# Patient Record
Sex: Female | Born: 1935 | Race: Black or African American | Hispanic: No | Marital: Single | Smoking: Never smoker
Health system: Southern US, Community
[De-identification: ages and names within clinical notes are randomized; demographics above are authoritative.]

## PROBLEM LIST (undated history)

## (undated) DIAGNOSIS — I6529 Occlusion and stenosis of unspecified carotid artery: Secondary | ICD-10-CM

## (undated) DIAGNOSIS — I2510 Atherosclerotic heart disease of native coronary artery without angina pectoris: Secondary | ICD-10-CM

## (undated) DIAGNOSIS — R7302 Impaired glucose tolerance (oral): Secondary | ICD-10-CM

## (undated) DIAGNOSIS — I255 Ischemic cardiomyopathy: Secondary | ICD-10-CM

## (undated) DIAGNOSIS — J40 Bronchitis, not specified as acute or chronic: Secondary | ICD-10-CM

## (undated) DIAGNOSIS — F0390 Unspecified dementia without behavioral disturbance: Secondary | ICD-10-CM

## (undated) DIAGNOSIS — E785 Hyperlipidemia, unspecified: Secondary | ICD-10-CM

## (undated) DIAGNOSIS — I252 Old myocardial infarction: Secondary | ICD-10-CM

## (undated) DIAGNOSIS — I10 Essential (primary) hypertension: Secondary | ICD-10-CM

## (undated) HISTORY — DX: Impaired glucose tolerance (oral): R73.02

## (undated) HISTORY — DX: Old myocardial infarction: I25.2

## (undated) HISTORY — DX: Occlusion and stenosis of unspecified carotid artery: I65.29

## (undated) HISTORY — DX: Bronchitis, not specified as acute or chronic: J40

## (undated) HISTORY — DX: Hyperlipidemia, unspecified: E78.5

## (undated) HISTORY — DX: Ischemic cardiomyopathy: I25.5

## (undated) HISTORY — DX: Atherosclerotic heart disease of native coronary artery without angina pectoris: I25.10

---

## 1998-08-19 ENCOUNTER — Other Ambulatory Visit: Admission: RE | Admit: 1998-08-19 | Discharge: 1998-08-19 | Payer: Self-pay | Admitting: Internal Medicine

## 1998-09-12 ENCOUNTER — Encounter: Payer: Self-pay | Admitting: Internal Medicine

## 1998-09-12 ENCOUNTER — Ambulatory Visit (HOSPITAL_COMMUNITY): Admission: RE | Admit: 1998-09-12 | Discharge: 1998-09-12 | Payer: Self-pay | Admitting: Internal Medicine

## 1998-09-17 ENCOUNTER — Ambulatory Visit (HOSPITAL_COMMUNITY): Admission: RE | Admit: 1998-09-17 | Discharge: 1998-09-17 | Payer: Self-pay | Admitting: Internal Medicine

## 1998-09-19 ENCOUNTER — Ambulatory Visit (HOSPITAL_COMMUNITY): Admission: RE | Admit: 1998-09-19 | Discharge: 1998-09-19 | Payer: Self-pay | Admitting: Internal Medicine

## 1999-10-06 ENCOUNTER — Encounter: Payer: Self-pay | Admitting: Internal Medicine

## 1999-10-06 ENCOUNTER — Ambulatory Visit (HOSPITAL_COMMUNITY): Admission: RE | Admit: 1999-10-06 | Discharge: 1999-10-06 | Payer: Self-pay | Admitting: Internal Medicine

## 2000-09-23 ENCOUNTER — Encounter: Admission: RE | Admit: 2000-09-23 | Discharge: 2000-09-23 | Payer: Self-pay | Admitting: Internal Medicine

## 2000-09-23 ENCOUNTER — Encounter: Payer: Self-pay | Admitting: Internal Medicine

## 2000-10-07 ENCOUNTER — Ambulatory Visit (HOSPITAL_COMMUNITY): Admission: RE | Admit: 2000-10-07 | Discharge: 2000-10-07 | Payer: Self-pay | Admitting: Internal Medicine

## 2000-10-07 ENCOUNTER — Encounter: Payer: Self-pay | Admitting: Internal Medicine

## 2001-08-15 ENCOUNTER — Other Ambulatory Visit: Admission: RE | Admit: 2001-08-15 | Discharge: 2001-08-15 | Payer: Self-pay | Admitting: Internal Medicine

## 2001-10-10 ENCOUNTER — Encounter: Payer: Self-pay | Admitting: Internal Medicine

## 2001-10-10 ENCOUNTER — Ambulatory Visit (HOSPITAL_COMMUNITY): Admission: RE | Admit: 2001-10-10 | Discharge: 2001-10-10 | Payer: Self-pay | Admitting: Internal Medicine

## 2002-10-12 ENCOUNTER — Ambulatory Visit (HOSPITAL_COMMUNITY): Admission: RE | Admit: 2002-10-12 | Discharge: 2002-10-12 | Payer: Self-pay | Admitting: Internal Medicine

## 2002-10-12 ENCOUNTER — Encounter: Payer: Self-pay | Admitting: Internal Medicine

## 2002-12-05 ENCOUNTER — Encounter (INDEPENDENT_AMBULATORY_CARE_PROVIDER_SITE_OTHER): Payer: Self-pay

## 2002-12-05 ENCOUNTER — Ambulatory Visit (HOSPITAL_COMMUNITY): Admission: RE | Admit: 2002-12-05 | Discharge: 2002-12-05 | Payer: Self-pay | Admitting: Gastroenterology

## 2003-10-15 ENCOUNTER — Ambulatory Visit (HOSPITAL_COMMUNITY): Admission: RE | Admit: 2003-10-15 | Discharge: 2003-10-15 | Payer: Self-pay | Admitting: Internal Medicine

## 2004-10-28 ENCOUNTER — Ambulatory Visit (HOSPITAL_COMMUNITY): Admission: RE | Admit: 2004-10-28 | Discharge: 2004-10-28 | Payer: Self-pay | Admitting: Internal Medicine

## 2005-12-13 ENCOUNTER — Ambulatory Visit (HOSPITAL_COMMUNITY): Admission: RE | Admit: 2005-12-13 | Discharge: 2005-12-13 | Payer: Self-pay | Admitting: Internal Medicine

## 2006-12-15 ENCOUNTER — Ambulatory Visit (HOSPITAL_COMMUNITY): Admission: RE | Admit: 2006-12-15 | Discharge: 2006-12-15 | Payer: Self-pay | Admitting: Internal Medicine

## 2007-12-21 ENCOUNTER — Ambulatory Visit (HOSPITAL_COMMUNITY): Admission: RE | Admit: 2007-12-21 | Discharge: 2007-12-21 | Payer: Self-pay | Admitting: Internal Medicine

## 2008-12-25 ENCOUNTER — Ambulatory Visit (HOSPITAL_COMMUNITY): Admission: RE | Admit: 2008-12-25 | Discharge: 2008-12-25 | Payer: Self-pay | Admitting: Internal Medicine

## 2010-02-17 ENCOUNTER — Ambulatory Visit (HOSPITAL_COMMUNITY): Admission: RE | Admit: 2010-02-17 | Discharge: 2010-02-17 | Payer: Self-pay | Admitting: Internal Medicine

## 2010-09-18 NOTE — Op Note (Signed)
[name] [name], [name] [name]                            ACCOUNT NO.:  [id number]   MEDICAL RECORD NO.:  [id number]                   PATIENT TYPE:  AMB   LOCATION:  ENDO                                 FACILITY:  [facility]   PHYSICIAN:  Anselmo Rod, M.D.               DATE OF BIRTH:  May 12, 1932   DATE OF PROCEDURE:  12/05/2002  DATE OF DISCHARGE:                                 OPERATIVE REPORT   PROCEDURE:  Colonoscopy with snare polypectomy x 7.   ENDOSCOPIST:  Charna Elizabeth, M.D.   INSTRUMENT USED:  Olympus video colonoscope.   INDICATIONS FOR PROCEDURE:  77 year old African American female with guaiac  positive stools undergoing screening colonoscopy to rule out colonic polyps,  masses, hemorrhoids, etc.   PREPROCEDURE PREPARATION:  Informed consent was obtained from the patient.  The patient was fasted for eight hours prior to the procedure and prepped  with a bottle of magnesium citrate and a gallon of GoLYTELY the night prior  to the procedure.   PREPROCEDURE PHYSICAL:  Patient with stable vital signs.  Neck supple.  Chest clear to auscultation.  S1 and S2 regular.  Abdomen soft with normal  bowel sounds.   DESCRIPTION OF PROCEDURE:  The patient was placed in the left lateral  decubitus position, sedated with 50 mg of Demerol and 5 mg Versed  intravenously.  Once the patient was adequately sedated, maintained on low  flow oxygen, continuous cardiac monitoring, the Olympus video colonoscope  was advanced from the rectum to the cecum with difficulty because of the  patient's body habitus.  The patient's position was changed from the left  lateral to supine position on several occasions and gentle abdominal  pressure was applied to reach the cecal base.  The appendiceal orifice and  ileocecal valve were clearly visualized and photographed.  Two small sessile  polyps were snared from the rectum, five small sessile polyps were snared  from the rectosigmoid area, small internal  hemorrhoid was seen on  retroflexion of the rectum.  The mucosa beyond the rectosigmoid area to the  terminal ileum appeared normal.  The patient tolerated the procedure well  without complications.   IMPRESSION:  1. Multiple polyps removed from the colon by snare polypectomy.  2. Small nonbleeding internal hemorrhoids.  3. Normal left colon, transverse colon, and terminal ileum.   RECOMMENDATIONS:  1. Await pathology result.  2.     Avoid all nonsteroidals for the next four weeks.  3. Outpatient follow up in the next two weeks.  4. Repeat colorectal cancer screening depending on the pathology results.                                               Anselmo Rod, M.D.    JNM/MEDQ  D:  12/05/2002  T:  12/05/2002  Job:  [id number]   cc:   Merlene Laughter. Renae Gloss, M.D.  [address]  [address]  [place]  [place] [address]  Fax: [phone]

## 2011-02-23 ENCOUNTER — Other Ambulatory Visit (HOSPITAL_COMMUNITY): Payer: Self-pay | Admitting: Internal Medicine

## 2011-02-23 DIAGNOSIS — Z1231 Encounter for screening mammogram for malignant neoplasm of breast: Secondary | ICD-10-CM

## 2011-03-23 ENCOUNTER — Ambulatory Visit (HOSPITAL_COMMUNITY)
Admission: RE | Admit: 2011-03-23 | Discharge: 2011-03-23 | Disposition: A | Discharge status: Home / Self Care | Payer: Medicare Other | Source: Ambulatory Visit | Attending: Internal Medicine | Admitting: Internal Medicine

## 2011-03-23 DIAGNOSIS — Z1231 Encounter for screening mammogram for malignant neoplasm of breast: Secondary | ICD-10-CM | POA: Insufficient documentation

## 2012-04-07 ENCOUNTER — Other Ambulatory Visit (HOSPITAL_COMMUNITY): Payer: Self-pay | Admitting: Internal Medicine

## 2012-04-07 DIAGNOSIS — Z1231 Encounter for screening mammogram for malignant neoplasm of breast: Secondary | ICD-10-CM

## 2012-04-27 ENCOUNTER — Ambulatory Visit (HOSPITAL_COMMUNITY)
Admission: RE | Admit: 2012-04-27 | Discharge: 2012-04-27 | Disposition: A | Discharge status: Home / Self Care | Payer: Medicare Other | Source: Ambulatory Visit | Attending: Internal Medicine | Admitting: Internal Medicine

## 2012-04-27 DIAGNOSIS — Z1231 Encounter for screening mammogram for malignant neoplasm of breast: Secondary | ICD-10-CM | POA: Insufficient documentation

## 2013-04-04 ENCOUNTER — Other Ambulatory Visit (HOSPITAL_COMMUNITY): Payer: Self-pay | Admitting: Internal Medicine

## 2013-04-04 DIAGNOSIS — Z1231 Encounter for screening mammogram for malignant neoplasm of breast: Secondary | ICD-10-CM

## 2013-05-01 ENCOUNTER — Ambulatory Visit (HOSPITAL_COMMUNITY)
Admission: RE | Admit: 2013-05-01 | Discharge: 2013-05-01 | Disposition: A | Discharge status: Home / Self Care | Payer: Medicare PPO | Source: Ambulatory Visit | Attending: Internal Medicine | Admitting: Internal Medicine

## 2013-05-01 DIAGNOSIS — Z1231 Encounter for screening mammogram for malignant neoplasm of breast: Secondary | ICD-10-CM | POA: Insufficient documentation

## 2014-02-10 ENCOUNTER — Encounter (HOSPITAL_COMMUNITY): Payer: Self-pay | Admitting: Emergency Medicine

## 2014-02-10 ENCOUNTER — Inpatient Hospital Stay (HOSPITAL_COMMUNITY)
Admission: EM | Admit: 2014-02-10 | Discharge: 2014-02-21 | DRG: 234 | Disposition: A | Discharge status: Home Health | Payer: Medicare PPO | Attending: Surgery | Admitting: Surgery

## 2014-02-10 ENCOUNTER — Emergency Department (HOSPITAL_COMMUNITY): Payer: Medicare PPO

## 2014-02-10 DIAGNOSIS — E669 Obesity, unspecified: Secondary | ICD-10-CM | POA: Diagnosis present

## 2014-02-10 DIAGNOSIS — K5900 Constipation, unspecified: Secondary | ICD-10-CM | POA: Diagnosis present

## 2014-02-10 DIAGNOSIS — Z6831 Body mass index (BMI) 31.0-31.9, adult: Secondary | ICD-10-CM | POA: Diagnosis not present

## 2014-02-10 DIAGNOSIS — R001 Bradycardia, unspecified: Secondary | ICD-10-CM | POA: Diagnosis present

## 2014-02-10 DIAGNOSIS — I200 Unstable angina: Secondary | ICD-10-CM

## 2014-02-10 DIAGNOSIS — Z8249 Family history of ischemic heart disease and other diseases of the circulatory system: Secondary | ICD-10-CM | POA: Diagnosis not present

## 2014-02-10 DIAGNOSIS — J9811 Atelectasis: Secondary | ICD-10-CM

## 2014-02-10 DIAGNOSIS — I214 Non-ST elevation (NSTEMI) myocardial infarction: Secondary | ICD-10-CM | POA: Diagnosis present

## 2014-02-10 DIAGNOSIS — E785 Hyperlipidemia, unspecified: Secondary | ICD-10-CM

## 2014-02-10 DIAGNOSIS — E039 Hypothyroidism, unspecified: Secondary | ICD-10-CM

## 2014-02-10 DIAGNOSIS — I959 Hypotension, unspecified: Secondary | ICD-10-CM

## 2014-02-10 DIAGNOSIS — I10 Essential (primary) hypertension: Secondary | ICD-10-CM | POA: Diagnosis present

## 2014-02-10 DIAGNOSIS — E876 Hypokalemia: Secondary | ICD-10-CM | POA: Diagnosis not present

## 2014-02-10 DIAGNOSIS — I25110 Atherosclerotic heart disease of native coronary artery with unstable angina pectoris: Secondary | ICD-10-CM | POA: Diagnosis present

## 2014-02-10 DIAGNOSIS — I2584 Coronary atherosclerosis due to calcified coronary lesion: Secondary | ICD-10-CM | POA: Diagnosis present

## 2014-02-10 DIAGNOSIS — I9581 Postprocedural hypotension: Secondary | ICD-10-CM | POA: Diagnosis present

## 2014-02-10 DIAGNOSIS — Z951 Presence of aortocoronary bypass graft: Secondary | ICD-10-CM

## 2014-02-10 DIAGNOSIS — E780 Pure hypercholesterolemia: Secondary | ICD-10-CM | POA: Diagnosis present

## 2014-02-10 DIAGNOSIS — I9589 Other hypotension: Secondary | ICD-10-CM

## 2014-02-10 DIAGNOSIS — I25810 Atherosclerosis of coronary artery bypass graft(s) without angina pectoris: Secondary | ICD-10-CM

## 2014-02-10 HISTORY — DX: Essential (primary) hypertension: I10

## 2014-02-10 LAB — BASIC METABOLIC PANEL
Anion gap: 14 (ref 5–15)
BUN: 16 mg/dL (ref 6–23)
CHLORIDE: 103 meq/L (ref 96–112)
CO2: 24 mEq/L (ref 19–32)
Calcium: 10 mg/dL (ref 8.4–10.5)
Creatinine, Ser: 1.11 mg/dL — ABNORMAL HIGH (ref 0.50–1.10)
GFR calc Af Amer: 53 mL/min — ABNORMAL LOW (ref >90)
GFR, EST NON AFRICAN AMERICAN: 46 mL/min — AB (ref >90)
GLUCOSE: 128 mg/dL — AB (ref 70–99)
POTASSIUM: 4 meq/L (ref 3.7–5.3)
SODIUM: 141 meq/L (ref 137–147)

## 2014-02-10 LAB — I-STAT TROPONIN, ED: Troponin i, poc: 0.05 ng/mL (ref 0.00–0.08)

## 2014-02-10 LAB — CBC
HEMATOCRIT: 37 % (ref 36.0–46.0)
Hemoglobin: 12.6 g/dL (ref 12.0–15.0)
MCH: 28.8 pg (ref 26.0–34.0)
MCHC: 34.1 g/dL (ref 30.0–36.0)
MCV: 84.7 fL (ref 78.0–100.0)
Platelets: 224 10*3/uL (ref 150–400)
RBC: 4.37 MIL/uL (ref 3.87–5.11)
RDW: 15.3 % (ref 11.5–15.5)
WBC: 7.8 10*3/uL (ref 4.0–10.5)

## 2014-02-10 MED ORDER — LEVOTHYROXINE SODIUM 100 MCG PO TABS
100.0000 ug | ORAL_TABLET | Freq: Every day | ORAL | Status: DC
Start: 2014-02-11 — End: 2014-02-16
  Administered 2014-02-11 – 2014-02-14 (×4): 100 ug via ORAL
  Filled 2014-02-10 (×8): qty 1

## 2014-02-10 MED ORDER — ASPIRIN 300 MG RE SUPP
300.0000 mg | RECTAL | Status: AC
  Filled 2014-02-10: qty 1

## 2014-02-10 MED ORDER — ASPIRIN 81 MG PO CHEW
324.0000 mg | CHEWABLE_TABLET | ORAL | Status: AC
  Filled 2014-02-10: qty 4

## 2014-02-10 MED ORDER — ACETAMINOPHEN 325 MG PO TABS: 650.0000 mg | ORAL_TABLET | ORAL | Status: DC | PRN

## 2014-02-10 MED ORDER — ASPIRIN EC 81 MG PO TBEC: 81.0000 mg | DELAYED_RELEASE_TABLET | Freq: Every day | ORAL | Status: DC

## 2014-02-10 MED ORDER — HEPARIN SODIUM (PORCINE) 5000 UNIT/ML IJ SOLN
5000.0000 [IU] | Freq: Three times a day (TID) | INTRAMUSCULAR | Status: DC
  Administered 2014-02-11: 5000 [IU] via SUBCUTANEOUS
  Filled 2014-02-10: qty 1

## 2014-02-10 MED ORDER — AMLODIPINE-ATORVASTATIN 10-10 MG PO TABS: 1.0000 | ORAL_TABLET | Freq: Every day | ORAL | Status: DC

## 2014-02-10 MED ORDER — ONDANSETRON HCL 4 MG/2ML IJ SOLN: 4.0000 mg | Freq: Four times a day (QID) | INTRAMUSCULAR | Status: DC | PRN

## 2014-02-10 MED ORDER — VITAMIN D3 25 MCG (1000 UNIT) PO TABS
2000.0000 [IU] | ORAL_TABLET | Freq: Every day | ORAL | Status: DC
  Administered 2014-02-11 – 2014-02-14 (×4): 2000 [IU] via ORAL
  Filled 2014-02-10 (×5): qty 2

## 2014-02-10 MED ORDER — NITROGLYCERIN 0.4 MG SL SUBL: 0.4000 mg | SUBLINGUAL_TABLET | SUBLINGUAL | Status: DC | PRN

## 2014-02-10 MED ORDER — ASPIRIN EC 81 MG PO TBEC
81.0000 mg | DELAYED_RELEASE_TABLET | Freq: Every day | ORAL | Status: DC
  Administered 2014-02-11 – 2014-02-14 (×4): 81 mg via ORAL
  Filled 2014-02-10 (×5): qty 1

## 2014-02-10 MED ORDER — NITROGLYCERIN 0.4 MG SL SUBL
0.4000 mg | SUBLINGUAL_TABLET | SUBLINGUAL | Status: DC | PRN
  Administered 2014-02-11: 0.4 mg via SUBLINGUAL
  Filled 2014-02-10: qty 1

## 2014-02-10 NOTE — ED Provider Notes (Signed)
CSN: [id number]     Arrival date & time 02/10/14  1909 History   First MD Initiated Contact with Patient 02/10/14 1950     Chief Complaint  Patient presents with  . Chest Pain     (Consider location/radiation/quality/duration/timing/severity/associated sxs/prior Treatment) HPI Complains of anterior chest pain radiating to left arm onset one week ago intermittent she first noticed it when walking on a treadmill has subsequently noticed it when walking in her home or doing housework discomfort is improved with rest. She had mild discomfort when she called 911 today EMS treated patient with one sublingual nitroglycerin. Patient had taken aspirin 325 mg prior to EMS arriving today. She presently asymptomatic and pain-free. No other associated symptoms Past Medical History  Diagnosis Date  . Hypertension   . High cholesterol    Past Surgical History  Procedure Laterality Date  . Cesarean section     No family history on file. History  Substance Use Topics  . Smoking status: Never Smoker   . Smokeless tobacco: Not on file  . Alcohol Use: No   OB History   Grav Para Term Preterm Abortions TAB SAB Ect Mult Living                 Review of Systems  Constitutional: Negative.   HENT: Negative.   Respiratory: Negative.   Cardiovascular: Positive for chest pain.  Gastrointestinal: Negative.   Musculoskeletal: Negative.   Skin: Negative.   Neurological: Negative.   Psychiatric/Behavioral: Negative.   All other systems reviewed and are negative.     Allergies  Review of patient's allergies indicates no known allergies.  Home Medications   Prior to Admission medications   Medication Sig Start Date End Date Taking? Authorizing Provider  amlodipine-atorvastatin (CADUET) 10-10 MG per tablet Take 1 tablet by mouth daily. 02/07/14  Yes Historical Provider, MD  aspirin EC 81 MG tablet Take 81 mg by mouth daily.   Yes Historical Provider, MD  Calcium Citrate (CITRACAL PO) Take 1  tablet by mouth daily.   Yes Historical Provider, MD  Cholecalciferol (VITAMIN D3) 2000 UNITS TABS Take 2,000 Units by mouth daily.   Yes Historical Provider, MD  levothyroxine (SYNTHROID, LEVOTHROID) 100 MCG tablet Take 100 mcg by mouth daily. 02/07/14  Yes Historical Provider, MD   BP 141/69  Pulse 64  Temp(Src) 97.9 F (36.6 C) (Oral)  Resp 18  SpO2 98% Physical Exam  Nursing note and vitals reviewed. Constitutional: She appears well-developed and well-nourished.  HENT:  Head: Normocephalic and atraumatic.  Eyes: Conjunctivae are normal. Pupils are equal, round, and reactive to light.  Neck: Neck supple. No tracheal deviation present. No thyromegaly present.  Cardiovascular: Normal rate and regular rhythm.   No murmur heard. Pulmonary/Chest: Effort normal and breath sounds normal.  Abdominal: Soft. Bowel sounds are normal. She exhibits no distension. There is no tenderness.  Obese  Musculoskeletal: Normal range of motion. She exhibits no edema and no tenderness.  Neurological: She is alert. Coordination normal.  Skin: Skin is warm and dry. No rash noted.  Psychiatric: She has a normal mood and affect.    ED Course  Procedures (including critical care time) Labs Review Labs Reviewed  CBC  BASIC METABOLIC PANEL  I-STAT TROPOININ, ED    Imaging Review No results found.   EKG Interpretation   Date/Time:  Sunday February 10 2014 19:16:20 EDT Ventricular Rate:  67 PR Interval:  193 QRS Duration: 91 QT Interval:  386 QTC Calculation: 407 R Axis:   -  39 Text Interpretation:  Sinus rhythm Supraventricular bigeminy Left axis  deviation Nonspecific T abnormalities, lateral leads No old tracing to  compare Confirmed by Rasool Rommel  MD, Harol Shabazz [phone]([id number]) on 02/10/2014 7:51:39 PM    Ethelda Chick 9:55 PM patient remains asymptomatic, pain-free. Results for orders placed during the hospital encounter of 02/10/14  CBC      Result Value Ref Range   WBC 7.8  4.0 - 10.5 K/uL   RBC 4.37  3.87 -  5.11 MIL/uL   Hemoglobin 12.6  12.0 - 15.0 g/dL   HCT 60.437.0  54.036.0 - 98.146.0 %   MCV 84.7  78.0 - 100.0 fL   MCH 28.8  26.0 - 34.0 pg   MCHC 34.1  30.0 - 36.0 g/dL   RDW 19.115.3  47.811.5 - 29.515.5 %   Platelets 224  150 - 400 K/uL  BASIC METABOLIC PANEL      Result Value Ref Range   Sodium 141  137 - 147 mEq/L   Potassium 4.0  3.7 - 5.3 mEq/L   Chloride 103  96 - 112 mEq/L   CO2 24  19 - 32 mEq/L   Glucose, Bld 128 (*) 70 - 99 mg/dL   BUN 16  6 - 23 mg/dL   Creatinine, Ser 6.211.11 (*) 0.50 - 1.10 mg/dL   Calcium 30.810.0  8.4 - 65.710.5 mg/dL   GFR calc non Af Amer 46 (*) >90 mL/min   GFR calc Af Amer 53 (*) >90 mL/min   Anion gap 14  5 - 15  I-STAT TROPOININ, ED      Result Value Ref Range   Troponin i, poc 0.05  0.00 - 0.08 ng/mL   Comment 3            Dg Chest 2 View  02/10/2014   CLINICAL DATA:  Shortness of breath for 1 week. Chest pain today. History of hypertension. Initial encounter.  EXAM: CHEST  2 VIEW  COMPARISON:  None.  FINDINGS: Enlarged cardiac silhouette and mediastinal contours with tortuosity and ectasia of the thoracic aorta. Atherosclerotic plaque within the thoracic aorta. There is minimal linear heterogeneous subsegmental atelectasis / scar within the peripheral aspect of the left mid lung. No discrete focal airspace opacities. No pleural effusion or pneumothorax. No evidence of edema. No acute osseus abnormalities.  IMPRESSION: Cardiomegaly with tortuosity/ectasia of the thoracic aorta without acute cardiopulmonary disease.   Electronically Signed   By: Simonne ComeJohn  Watts M.D.   On: 02/10/2014 20:43    MDM  Symptoms are highly concerning for unstable angina. I spoke with cardiologist on call who will come to evaluate patient Final diagnoses:  None   spoke with cardiology on-call who will come to evaluate patient for admission      Doug SouSam Fredericka Bottcher, MD 02/10/14 2207

## 2014-02-10 NOTE — ED Notes (Addendum)
Pt from home, having substernal cp that radiates to left arm that started 3 days ago. Pt was walking on treadmill when she began having some soreness in her chest and became sob. Pt states for the past 3 days she has been experiencing these sx but they usually go away when she lies down but today they did not. Pt had 324 ASA and 1 Nitro. Pt is painfree at the moment. Pt has hx of HTN and High cholesterol.

## 2014-02-10 NOTE — H&P (Addendum)
Patient ID: [name] [name] MRN: [id number], DOB/AGE: Jan 10, 1933   Admit date: 02/10/2014   Primary Physician: No primary provider on file. Primary Cardiologist: None  Pt. Profile:  80F with HTN, HLD, hypothyroidism who presents with progressive anginal symptoms.   Problem List  Past Medical History  Diagnosis Date  . Hypertension   . High cholesterol     Past Surgical History  Procedure Laterality Date  . Cesarean section       Allergies  No Known Allergies  HPI 80F with HTN, HLD, hypothyroidism who presents with progressive anginal symptoms.   [name] states that starting a few weeks ago, she noticed an abnormal chest sensation and exercise limitation during her usual treadmill work-out (20 minutes). States she started to notice a "tired" sensation in her chest and left arm with exertion. She notes that over the past few weeks her exercise tolerance has decreased to the point where she has to stop after 2 minutes on the treadmill and with certain types of housework. Rest improves the symptoms.  Today, she noticed that while relaxing after dinner, she developed frank chest pain at rest. This was the first time she had frank pain and it was the 1st time she had experienced the discomfort at rest. Because of this, EMS was called. In the truck, she was given SL NTG with improvement in her pain.   In the ED, she was hemodynamically stable. POC TnI 0.05, Cr 1.11, K 4.0. CXR demonstrated cardiomegaly with tortuosity/ectasia of the thoracic aorta without acute cardiopulmonary disease. ECG demonstrated NSR, PACs, NSTTWC. No priors for comparison. Cardiology was consulted for admission.   Of note, [name] has a total of 5 sisters and 3 brothers. One sister had a CABG at age 78. No other CAD in her family.   Home Medications  Prior to Admission medications   Medication Sig Start Date End Date Taking? Authorizing Provider  amlodipine-atorvastatin (CADUET) 10-10 MG per tablet Take 1  tablet by mouth daily. 02/07/14  Yes Historical Provider, MD  aspirin EC 81 MG tablet Take 81 mg by mouth daily.   Yes Historical Provider, MD  Calcium Citrate (CITRACAL PO) Take 1 tablet by mouth daily.   Yes Historical Provider, MD  Cholecalciferol (VITAMIN D3) 2000 UNITS TABS Take 2,000 Units by mouth daily.   Yes Historical Provider, MD  levothyroxine (SYNTHROID, LEVOTHROID) 100 MCG tablet Take 100 mcg by mouth daily. 02/07/14  Yes Historical Provider, MD    Family History  No family history on file.  Social History  History   Social History  . Marital Status: Single    Spouse Name: N/A    Number of Children: N/A  . Years of Education: N/A   Occupational History  . Not on file.   Social History Main Topics  . Smoking status: Never Smoker   . Smokeless tobacco: Not on file  . Alcohol Use: No  . Drug Use: No  . Sexual Activity: Not on file   Other Topics Concern  . Not on file   Social History Narrative  . No narrative on file     Review of Systems General:  No chills, fever, night sweats or weight changes.  Cardiovascular:  See HPI Dermatological: No rash, lesions/masses Respiratory: No cough, dyspnea Urologic: No hematuria, dysuria Abdominal:   No nausea, vomiting, diarrhea, bright red blood per rectum, melena, or hematemesis Neurologic:  No visual changes, wkns, changes in mental status. All other systems reviewed and are otherwise negative  except as noted above.  Physical Exam  Blood pressure 104/53, pulse 56, temperature 97.9 F (36.6 C), temperature source Oral, resp. rate 13, SpO2 98.00%.  General: Pleasant, NAD Psych: Normal affect. Neuro: Alert and oriented X 3. Moves all extremities spontaneously. HEENT: Normal  Neck: Supple without bruits or JVD. Lungs:  Resp regular and unlabored, CTA. Heart: RRR no s3, s4. Soft 2/6 mid peaking crescendo decrescendo murmur at LUSB. No radiation. (suspect aortic sclerosis or mild stenosis)  Abdomen: Soft,  non-tender, non-distended, BS + x 4.  Extremities: No clubbing, cyanosis or edema. DP/PT/Radials 2+ and equal bilaterally.  Labs  Troponin [facility](Point of Care Test)  Recent Labs  02/10/14 2019  TROPIPOC 0.05   No results found for this basename: CKTOTAL, CKMB, TROPONINI,  in the last 72 hours Lab Results  Component Value Date   WBC 7.8 02/10/2014   HGB 12.6 02/10/2014   HCT 37.0 02/10/2014   MCV 84.7 02/10/2014   PLT 224 02/10/2014    Recent Labs Lab 02/10/14 2011  NA 141  K 4.0  CL 103  CO2 24  BUN 16  CREATININE 1.11*  CALCIUM 10.0  GLUCOSE 128*   No results found for this basename: CHOL, HDL, LDLCALC, TRIG   No results found for this basename: DDIMER     Radiology/Studies  Dg Chest 2 View  02/10/2014   CLINICAL DATA:  Shortness of breath for 1 week. Chest pain today. History of hypertension. Initial encounter.  EXAM: CHEST  2 VIEW  COMPARISON:  None.  FINDINGS: Enlarged cardiac silhouette and mediastinal contours with tortuosity and ectasia of the thoracic aorta. Atherosclerotic plaque within the thoracic aorta. There is minimal linear heterogeneous subsegmental atelectasis / scar within the peripheral aspect of the left mid lung. No discrete focal airspace opacities. No pleural effusion or pneumothorax. No evidence of edema. No acute osseus abnormalities.  IMPRESSION: Cardiomegaly with tortuosity/ectasia of the thoracic aorta without acute cardiopulmonary disease.   Electronically Signed   By: Simonne ComeJohn  Watts M.D.   On: 02/10/2014 20:43    ECG  02/10/14: NSR, PACs, NSTTWC. No priors for comparison.   ASSESSMENT AND PLAN  80F with HTN, HLD, hypothyroidism who presents with progressive anginal symptoms. Risk factors are age, HTN, HLD, and family history. Given classic symptoms with risk factors, would be reasonable to proceed directly to cath. Of note, she has no history of bleeding or strokes. She has no upcoming procedures and does not anticipate any issues with DAPT.    1. NPO s/p MN for possible cath (vs. Stress) 2. Cycle cardiac markers 3. TSH, lipids, A1c   Signed, Glori LuisFRIEDMAN, Jeanell Mangan, MD 02/10/2014, 9:56 PM  Addendum TnI 0.59. Start UFH, BB. Plan for cardiac cath

## 2014-02-11 ENCOUNTER — Encounter (HOSPITAL_COMMUNITY): Payer: Self-pay | Admitting: *Deleted

## 2014-02-11 ENCOUNTER — Encounter (HOSPITAL_COMMUNITY)
Admission: EM | Disposition: A | Discharge status: Home Health | Payer: Medicare PPO | Source: Home / Self Care | Attending: Surgery

## 2014-02-11 DIAGNOSIS — E039 Hypothyroidism, unspecified: Secondary | ICD-10-CM

## 2014-02-11 DIAGNOSIS — E785 Hyperlipidemia, unspecified: Secondary | ICD-10-CM

## 2014-02-11 DIAGNOSIS — I10 Essential (primary) hypertension: Secondary | ICD-10-CM

## 2014-02-11 DIAGNOSIS — I2510 Atherosclerotic heart disease of native coronary artery without angina pectoris: Secondary | ICD-10-CM

## 2014-02-11 HISTORY — PX: LEFT HEART CATHETERIZATION WITH CORONARY ANGIOGRAM: SHX5451

## 2014-02-11 LAB — BASIC METABOLIC PANEL
Anion gap: 13 (ref 5–15)
BUN: 12 mg/dL (ref 6–23)
CHLORIDE: 107 meq/L (ref 96–112)
CO2: 25 mEq/L (ref 19–32)
Calcium: 10 mg/dL (ref 8.4–10.5)
Creatinine, Ser: 0.9 mg/dL (ref 0.50–1.10)
GFR calc Af Amer: 69 mL/min — ABNORMAL LOW (ref >90)
GFR calc non Af Amer: 59 mL/min — ABNORMAL LOW (ref >90)
GLUCOSE: 90 mg/dL (ref 70–99)
POTASSIUM: 4 meq/L (ref 3.7–5.3)
Sodium: 145 mEq/L (ref 137–147)

## 2014-02-11 LAB — HEPARIN LEVEL (UNFRACTIONATED): Heparin Unfractionated: 0.68 IU/mL (ref 0.30–0.70)

## 2014-02-11 LAB — MRSA PCR SCREENING: MRSA by PCR: NEGATIVE

## 2014-02-11 LAB — CBC
HCT: 38.7 % (ref 36.0–46.0)
HEMOGLOBIN: 13 g/dL (ref 12.0–15.0)
MCH: 28.7 pg (ref 26.0–34.0)
MCHC: 33.6 g/dL (ref 30.0–36.0)
MCV: 85.4 fL (ref 78.0–100.0)
Platelets: 246 10*3/uL (ref 150–400)
RBC: 4.53 MIL/uL (ref 3.87–5.11)
RDW: 15.4 % (ref 11.5–15.5)
WBC: 7.2 10*3/uL (ref 4.0–10.5)

## 2014-02-11 LAB — LIPID PANEL
Cholesterol: 143 mg/dL (ref 0–200)
HDL: 59 mg/dL (ref >39)
LDL CALC: 78 mg/dL (ref 0–99)
TRIGLYCERIDES: 28 mg/dL (ref <150)
Total CHOL/HDL Ratio: 2.4 RATIO
VLDL: 6 mg/dL (ref 0–40)

## 2014-02-11 LAB — TROPONIN I
Troponin I: 0.59 ng/mL (ref <0.30)
Troponin I: 0.52 ng/mL (ref <0.30)
Troponin I: 1.02 ng/mL (ref <0.30)

## 2014-02-11 LAB — HEMOGLOBIN A1C
Hgb A1c MFr Bld: 6.1 % — ABNORMAL HIGH (ref <5.7)
Mean Plasma Glucose: 128 mg/dL — ABNORMAL HIGH (ref <117)

## 2014-02-11 LAB — POTASSIUM: POTASSIUM: 3.4 meq/L — AB (ref 3.7–5.3)

## 2014-02-11 LAB — T4, FREE: FREE T4: 1.18 ng/dL (ref 0.80–1.80)

## 2014-02-11 LAB — TSH: TSH: 2.53 u[IU]/mL (ref 0.350–4.500)

## 2014-02-11 LAB — MAGNESIUM: Magnesium: 1.8 mg/dL (ref 1.5–2.5)

## 2014-02-11 SURGERY — LEFT HEART CATHETERIZATION WITH CORONARY ANGIOGRAM
Anesthesia: LOCAL

## 2014-02-11 MED ORDER — ATORVASTATIN CALCIUM 40 MG PO TABS
40.0000 mg | ORAL_TABLET | Freq: Every day | ORAL | Status: DC
  Administered 2014-02-11 – 2014-02-13 (×3): 40 mg via ORAL
  Filled 2014-02-11 (×3): qty 1

## 2014-02-11 MED ORDER — HEPARIN SODIUM (PORCINE) 1000 UNIT/ML IJ SOLN
INTRAMUSCULAR | Status: AC
  Filled 2014-02-11: qty 1

## 2014-02-11 MED ORDER — SODIUM CHLORIDE 0.9 % IV SOLN
INTRAVENOUS | Status: DC
  Administered 2014-02-14: 5 mL/h via INTRAVENOUS

## 2014-02-11 MED ORDER — METOPROLOL TARTRATE 12.5 MG HALF TABLET
12.5000 mg | ORAL_TABLET | Freq: Two times a day (BID) | ORAL | Status: DC
  Administered 2014-02-11 – 2014-02-14 (×7): 12.5 mg via ORAL
  Filled 2014-02-11 (×11): qty 1

## 2014-02-11 MED ORDER — FENTANYL CITRATE 0.05 MG/ML IJ SOLN
INTRAMUSCULAR | Status: AC
  Filled 2014-02-11: qty 2

## 2014-02-11 MED ORDER — HEPARIN (PORCINE) IN NACL 100-0.45 UNIT/ML-% IJ SOLN
900.0000 [IU]/h | INTRAMUSCULAR | Status: DC
  Administered 2014-02-11: 900 [IU]/h via INTRAVENOUS
  Filled 2014-02-11 (×2): qty 250

## 2014-02-11 MED ORDER — SODIUM CHLORIDE 0.9 % IV SOLN: 250.0000 mL | INTRAVENOUS | Status: DC | PRN

## 2014-02-11 MED ORDER — LIDOCAINE HCL (PF) 1 % IJ SOLN
INTRAMUSCULAR | Status: AC
  Filled 2014-02-11: qty 30

## 2014-02-11 MED ORDER — AMLODIPINE BESYLATE 10 MG PO TABS
10.0000 mg | ORAL_TABLET | Freq: Every day | ORAL | Status: DC
  Filled 2014-02-11: qty 1

## 2014-02-11 MED ORDER — NITROGLYCERIN 1 MG/10 ML FOR IR/CATH LAB
INTRA_ARTERIAL | Status: AC
  Filled 2014-02-11: qty 10

## 2014-02-11 MED ORDER — SODIUM CHLORIDE 0.9 % IV SOLN
INTRAVENOUS | Status: DC
  Administered 2014-02-11: 14:00:00 via INTRAVENOUS

## 2014-02-11 MED ORDER — SODIUM CHLORIDE 0.9 % IJ SOLN
3.0000 mL | Freq: Two times a day (BID) | INTRAMUSCULAR | Status: DC
  Administered 2014-02-11: 3 mL via INTRAVENOUS

## 2014-02-11 MED ORDER — FUROSEMIDE 10 MG/ML IJ SOLN
40.0000 mg | Freq: Two times a day (BID) | INTRAMUSCULAR | Status: DC
  Administered 2014-02-11 – 2014-02-13 (×4): 40 mg via INTRAVENOUS
  Filled 2014-02-11 (×5): qty 4

## 2014-02-11 MED ORDER — MIDAZOLAM HCL 2 MG/2ML IJ SOLN
INTRAMUSCULAR | Status: AC
  Filled 2014-02-11: qty 2

## 2014-02-11 MED ORDER — HEPARIN (PORCINE) IN NACL 100-0.45 UNIT/ML-% IJ SOLN
1100.0000 [IU]/h | INTRAMUSCULAR | Status: DC
Start: 2014-02-12 — End: 2014-02-14
  Administered 2014-02-12: 900 [IU]/h via INTRAVENOUS
  Administered 2014-02-13: 1100 [IU]/h via INTRAVENOUS
  Filled 2014-02-11 (×4): qty 250

## 2014-02-11 MED ORDER — SODIUM CHLORIDE 0.9 % IV SOLN
1.0000 mL/kg/h | INTRAVENOUS | Status: AC
  Administered 2014-02-11: 1 mL/kg/h via INTRAVENOUS

## 2014-02-11 MED ORDER — VERAPAMIL HCL 2.5 MG/ML IV SOLN
INTRAVENOUS | Status: AC
  Filled 2014-02-11: qty 2

## 2014-02-11 MED ORDER — HEPARIN (PORCINE) IN NACL 100-0.45 UNIT/ML-% IJ SOLN
900.0000 [IU]/h | INTRAMUSCULAR | Status: DC
Start: 2014-02-12 — End: 2014-02-11

## 2014-02-11 MED ORDER — NITROGLYCERIN IN D5W 200-5 MCG/ML-% IV SOLN
INTRAVENOUS | Status: AC
  Filled 2014-02-11: qty 250

## 2014-02-11 MED ORDER — AMLODIPINE BESYLATE 10 MG PO TABS
10.0000 mg | ORAL_TABLET | Freq: Every day | ORAL | Status: DC
  Administered 2014-02-11 – 2014-02-13 (×3): 10 mg via ORAL
  Filled 2014-02-11 (×3): qty 1

## 2014-02-11 MED ORDER — ATORVASTATIN CALCIUM 10 MG PO TABS
10.0000 mg | ORAL_TABLET | Freq: Every day | ORAL | Status: DC
  Filled 2014-02-11: qty 1

## 2014-02-11 MED ORDER — SODIUM CHLORIDE 0.9 % IJ SOLN: 3.0000 mL | INTRAMUSCULAR | Status: DC | PRN

## 2014-02-11 MED ORDER — HEPARIN (PORCINE) IN NACL 2-0.9 UNIT/ML-% IJ SOLN
INTRAMUSCULAR | Status: AC
  Filled 2014-02-11: qty 1000

## 2014-02-11 MED ORDER — NITROGLYCERIN IN D5W 200-5 MCG/ML-% IV SOLN
0.0000 ug/min | INTRAVENOUS | Status: DC
  Administered 2014-02-11: 10 ug/min via INTRAVENOUS

## 2014-02-11 MED ORDER — HEPARIN BOLUS VIA INFUSION
2000.0000 [IU] | Freq: Once | INTRAVENOUS | Status: AC
  Administered 2014-02-11: 2000 [IU] via INTRAVENOUS
  Filled 2014-02-11: qty 2000

## 2014-02-11 NOTE — Interval H&P Note (Signed)
History and Physical Interval Note:  02/11/2014 4:37 PM  [name] ConnersEtrulia [name]  has presented today for surgery, with the diagnosis of c/p  The various methods of treatment have been discussed with the patient and family. After consideration of risks, benefits and other options for treatment, the patient has consented to  Procedure(s): LEFT HEART CATHETERIZATION WITH CORONARY ANGIOGRAM (N/A) as a surgical intervention .  The patient's history has been reviewed, patient examined, no change in status, stable for surgery.  I have reviewed the patient's chart and labs.  Questions were answered to the patient's satisfaction.   Cath Lab Visit (complete for each Cath Lab visit)  Clinical Evaluation Leading to the Procedure:   ACS: Yes.    Non-ACS:    Anginal Classification: CCS IV  Anti-ischemic medical therapy: Minimal Therapy (1 class of medications)  Non-Invasive Test Results: No non-invasive testing performed  Prior CABG: No previous CABG        Theron Aristaeter [facility] FJordanMD,FACC 02/11/2014 4:37 PM

## 2014-02-11 NOTE — Progress Notes (Signed)
CRITICAL VALUE ALERT  Critical value received:  Troponin 0.59  Date of notification:  02/11/2014  Time of notification:  0234  Critical value read back: yes  Nurse who received alert:  Sharlyne Caiiana Jenkins, RN  MD notified (1st page):  Dr. Sindy GuadeloupeFriedman-Cardiology  Time of first page:  0315  MD notified (2nd page):n/a  Time of second page:n/a   Responding MD:  Dr. Zachery ConchFriedman  Time MD responded:  [phone]

## 2014-02-11 NOTE — Progress Notes (Signed)
Attempted to deflate TR Band w/o success. Patient started to bleed at site.  3ccs air placed back in.  Will reassess and monitor.

## 2014-02-11 NOTE — Progress Notes (Signed)
ANTICOAGULATION CONSULT NOTE - Initial Consult  Pharmacy Consult for heparin Indication: chest pain/ACS  No Known Allergies  Patient Measurements: Height: 5' (152.4 cm) Weight: 191 lb 1.6 oz (86.682 kg) IBW/kg (Calculated) : 45.5 Heparin Dosing Weight: 65kg  Vital Signs: Temp: 97.9 F (36.6 C) (10/11 2340) Temp Source: Oral (10/11 1919) BP: 131/66 mmHg (10/12 0008) Pulse Rate: 67 (10/12 0008)  Labs:  Recent Labs  02/10/14 2011 02/11/14 0140  HGB 12.6  --   HCT 37.0  --   PLT 224  --   CREATININE 1.11*  --   TROPONINI  --  0.59*    Estimated Creatinine Clearance: 40.2 ml/min (by C-G formula based on Cr of 1.11).   Medical History: Past Medical History  Diagnosis Date  . Hypertension   . High cholesterol     Medications:  Prescriptions prior to admission  Medication Sig Dispense Refill  . amlodipine-atorvastatin (CADUET) 10-10 MG per tablet Take 1 tablet by mouth daily.      Marland Kitchen. aspirin EC 81 MG tablet Take 81 mg by mouth daily.      . Calcium Citrate (CITRACAL PO) Take 1 tablet by mouth daily.      . Cholecalciferol (VITAMIN D3) 2000 UNITS TABS Take 2,000 Units by mouth daily.      Marland Kitchen. levothyroxine (SYNTHROID, LEVOTHROID) 100 MCG tablet Take 100 mcg by mouth daily.       Scheduled:  . amLODipine  10 mg Oral Daily   And  . atorvastatin  10 mg Oral Daily  . aspirin  324 mg Oral NOW   Or  . aspirin  300 mg Rectal NOW  . aspirin EC  81 mg Oral Daily  . cholecalciferol  2,000 Units Oral Daily  . levothyroxine  100 mcg Oral QAC breakfast  . metoprolol tartrate  12.5 mg Oral BID     Assessment: 80yo female c/o CP x3d, had improved w/ rest until tonight when CP did not resolve, to begin heparin for possible ACS.  Goal of Therapy:  Heparin level 0.3-0.7 units/ml Monitor platelets by anticoagulation protocol: Yes   Plan:  Will give heparin 2000 units x1 followed by gtt at 900 units/hr and monitor heparin levels and CBC.  Vernard GamblesVeronda Elwyn Lowden, PharmD, BCPS   02/11/2014,3:26 AM

## 2014-02-11 NOTE — Progress Notes (Signed)
Pt heparin stopped as ordered, pt transported off to Cath lab for procedure. Arabella MerlesP. Amo Jaxsen Bernhart RN.

## 2014-02-11 NOTE — Progress Notes (Signed)
New IV inserted to pt Left wrist saline locked; pt informed consent signed and received; pt right radial and groin clipped by Tech; pt remains NPO and awaiting for procedure. Will continue to monitor pt quietly. Arabella MerlesP. Amo Ragen Laver RN.

## 2014-02-11 NOTE — Progress Notes (Signed)
  Cardiology: Quinita Kostelecky  Subjective:  CP as described. Feels a tiredness in left arm.   Objective:  Vital Signs in the last 24 hours: Temp:  [97.9 F (36.6 C)-98.3 F (36.8 C)] 98.3 F (36.8 C) (10/12 0530) Pulse Rate:  [51-74] 57 (10/12 0530) Resp:  [13-24] 18 (10/12 0530) BP: (98-143)/(50-81) 136/65 mmHg (10/12 0530) SpO2:  [96 %-100 %] 98 % (10/12 0530) Weight:  [191 lb 1.6 oz (86.682 kg)] 191 lb 1.6 oz (86.682 kg) (10/11 2340)  Intake/Output from previous day: 10/11 0701 - 10/12 0700 In: -  Out: 350 [Urine:350]   Physical Exam: General: Well developed, well nourished, in no acute distress. Head:  Normocephalic and atraumatic. Lungs: Clear to auscultation and percussion. Heart: Normal S1 and S2.  No murmur, rubs or gallops.  Abdomen: soft, non-tender, positive bowel sounds. Extremities: No clubbing or cyanosis. No edema. 2+ radial pulse Neurologic: Alert and oriented x 3.    Lab Results:  Recent Labs  02/10/14 2011 02/11/14 0545  WBC 7.8 7.2  HGB 12.6 13.0  PLT 224 246    Recent Labs  02/10/14 2011 02/11/14 0545  NA 141 145  K 4.0 4.0  CL 103 107  CO2 24 25  GLUCOSE 128* 90  BUN 16 12  CREATININE 1.11* 0.90    Recent Labs  02/11/14 0140 02/11/14 0545  TROPONINI 0.59* 0.52*     Recent Labs  02/11/14 0545  CHOL 143    Imaging: Dg Chest 2 View  02/10/2014   CLINICAL DATA:  Shortness of breath for 1 week. Chest pain today. History of hypertension. Initial encounter.  EXAM: CHEST  2 VIEW  COMPARISON:  None.  FINDINGS: Enlarged cardiac silhouette and mediastinal contours with tortuosity and ectasia of the thoracic aorta. Atherosclerotic plaque within the thoracic aorta. There is minimal linear heterogeneous subsegmental atelectasis / scar within the peripheral aspect of the left mid lung. No discrete focal airspace opacities. No pleural effusion or pneumothorax. No evidence of edema. No acute osseus abnormalities.  IMPRESSION: Cardiomegaly with  tortuosity/ectasia of the thoracic aorta without acute cardiopulmonary disease.   Electronically Signed   By: Simonne ComeJohn  Watts M.D.   On: 02/10/2014 20:43    Telemetry: No adverse rhythms  Personally viewed.   EKG:  NSR 67, PAC, NSSTW changes.   Cardiac Studies:  Await cath.   Assessment/Plan:  Principal Problem:   Unstable angina Active Problems:   HTN (hypertension)   HLD (hyperlipidemia)   Hypothyroidism  80 year old with NSTEMI  NSTEMI  - Trop 0.59  - LDL 78  - Creat 0.9  - NSSTW changes  - Progressive anginal symptoms during workout (treamill 20 min). Chest, left arm, decreased exercise tolerance. Had CP at rest, EMS.  - ASA, Statin, Bb, Heparin  - Holding DAPT secondary to possible 3v CAD  - Proceed with cath (may be later this afternoon). Will give liquid breakfast.   FHX of CAD  - sister CABG  HTN  - stable, low dose metoprolol and amlodipine  Hyperlipidemia  - statin (currently low dose, will increase to higher dose)  - she has not had any issues with statins in the past.     Pat Sires 02/11/2014, 8:27 AM

## 2014-02-11 NOTE — Progress Notes (Signed)
[facility] new ICU arrival brief camera care note  S/p NSTEMI wth cath - showing she needs stent v cabg  Camera exam   - looks stable  - HR 66, Pulse 99%, MAP 113, RR 21  PULMONARY No results found for this basename: PHART, PCO2, PCO2ART, PO2, PO2ART, HCO3, TCO2, O2SAT,  in the last 168 hours  CBC  Recent Labs Lab 02/10/14 2011 02/11/14 0545  HGB 12.6 13.0  HCT 37.0 38.7  WBC 7.8 7.2  PLT 224 246    COAGULATION No results found for this basename: INR,  in the last 168 hours  CARDIAC   Recent Labs Lab 02/11/14 0140 02/11/14 0545 02/11/14 1154  TROPONINI 0.59* 0.52* 1.02*   No results found for this basename: PROBNP,  in the last 168 hours   CHEMISTRY  Recent Labs Lab 02/10/14 2011 02/11/14 0545  NA 141 145  K 4.0 4.0  CL 103 107  CO2 24 25  GLUCOSE 128* 90  BUN 16 12  CREATININE 1.11* 0.90  CALCIUM 10.0 10.0   Estimated Creatinine Clearance: 49.2 ml/min (by C-G formula based on Cr of 0.9).   LIVER No results found for this basename: AST, ALT, ALKPHOS, BILITOT, PROT, ALBUMIN, INR,  in the last 168 hours   INFECTIOUS No results found for this basename: LATICACIDVEN, PROCALCITON,  in the last 168 hours   ENDOCRINE CBG (last 3)  No results found for this basename: GLUCAP,  in the last 72 hours       IMAGING x48h Dg Chest 2 View  02/10/2014   CLINICAL DATA:  Shortness of breath for 1 week. Chest pain today. History of hypertension. Initial encounter.  EXAM: CHEST  2 VIEW  COMPARISON:  None.  FINDINGS: Enlarged cardiac silhouette and mediastinal contours with tortuosity and ectasia of the thoracic aorta. Atherosclerotic plaque within the thoracic aorta. There is minimal linear heterogeneous subsegmental atelectasis / scar within the peripheral aspect of the left mid lung. No discrete focal airspace opacities. No pleural effusion or pneumothorax. No evidence of edema. No acute osseus abnormalities.  IMPRESSION: Cardiomegaly with tortuosity/ectasia of  the thoracic aorta without acute cardiopulmonary disease.   Electronically Signed   By: Simonne ComeJohn  Watts M.D.   On: 02/10/2014 20:43    A NSTEMI  P No eicu intervention Monitor RN to tackle BP; lopresor   Dr. Kalman ShanMurali Leightyn Cina, M.D., [facility] HospitalF.C.C.P Pulmonary and Critical Care Medicine Staff Physician [facility] Pager: [phone], If no answer or between  15:00h - 7:00h: call [phone]  02/11/2014 6:48 PM

## 2014-02-11 NOTE — Progress Notes (Addendum)
ANTICOAGULATION CONSULT NOTE - Follow Up Consult  Pharmacy Consult for Heparin Indication: chest pain/ACS/NSTEMI  No Known Allergies  Patient Measurements: Height: 5' (152.4 cm) Weight: 191 lb 1.6 oz (86.682 kg) IBW/kg (Calculated) : 45.5 Heparin Dosing Weight: 67 kg  Vital Signs: Temp: 98.3 F (36.8 C) (10/12 0530) Temp Source: Oral (10/12 0530) BP: 136/65 mmHg (10/12 0530) Pulse Rate: 57 (10/12 0530)  Labs:  Recent Labs  02/10/14 2011 02/11/14 0140 02/11/14 0545 02/11/14 1154  HGB 12.6  --  13.0  --   HCT 37.0  --  38.7  --   PLT 224  --  246  --   HEPARINUNFRC  --   --   --  0.68  CREATININE 1.11*  --  0.90  --   TROPONINI  --  0.59* 0.52* 1.02*    Estimated Creatinine Clearance: 49.6 ml/min (by C-G formula based on Cr of 0.9).  Assessment:  Initial heparin level is therapeutic (0.68) on 900 units/hr.  For cardiac cath later today.  Goal of Therapy:  Heparin level 0.3-0.7 units/ml Monitor platelets by anticoagulation protocol: Yes   Plan:   Continue heparin drip at 900 units/hr.  Daily heparin level and CBC while on heparin.  Follow-up oost-cath.  Dennie Fettersgan, Theresa Donovan, [place] Pager: [phone] 02/11/2014,1:25 PM  02/11/2014 7:22 PM S/p cath, to evaluate for possible CABG.  Pharmacy consulted to restart heparin 8h after sheath out.  Will begin on 10/13 at 3am, at 900 units/hr checking heparin level 8h after ggt starts.  Aki Burdin Christine Virginia CrewsBates Yuliana Vandrunen

## 2014-02-11 NOTE — H&P (View-Only) (Signed)
  Cardiology: Riddhi Grether  Subjective:  CP as described. Feels a tiredness in left arm.   Objective:  Vital Signs in the last 24 hours: Temp:  [97.9 F (36.6 C)-98.3 F (36.8 C)] 98.3 F (36.8 C) (10/12 0530) Pulse Rate:  [51-74] 57 (10/12 0530) Resp:  [13-24] 18 (10/12 0530) BP: (98-143)/(50-81) 136/65 mmHg (10/12 0530) SpO2:  [96 %-100 %] 98 % (10/12 0530) Weight:  [191 lb 1.6 oz (86.682 kg)] 191 lb 1.6 oz (86.682 kg) (10/11 2340)  Intake/Output from previous day: 10/11 0701 - 10/12 0700 In: -  Out: 350 [Urine:350]   Physical Exam: General: Well developed, well nourished, in no acute distress. Head:  Normocephalic and atraumatic. Lungs: Clear to auscultation and percussion. Heart: Normal S1 and S2.  No murmur, rubs or gallops.  Abdomen: soft, non-tender, positive bowel sounds. Extremities: No clubbing or cyanosis. No edema. 2+ radial pulse Neurologic: Alert and oriented x 3.    Lab Results:  Recent Labs  02/10/14 2011 02/11/14 0545  WBC 7.8 7.2  HGB 12.6 13.0  PLT 224 246    Recent Labs  02/10/14 2011 02/11/14 0545  NA 141 145  K 4.0 4.0  CL 103 107  CO2 24 25  GLUCOSE 128* 90  BUN 16 12  CREATININE 1.11* 0.90    Recent Labs  02/11/14 0140 02/11/14 0545  TROPONINI 0.59* 0.52*     Recent Labs  02/11/14 0545  CHOL 143    Imaging: Dg Chest 2 View  02/10/2014   CLINICAL DATA:  Shortness of breath for 1 week. Chest pain today. History of hypertension. Initial encounter.  EXAM: CHEST  2 VIEW  COMPARISON:  None.  FINDINGS: Enlarged cardiac silhouette and mediastinal contours with tortuosity and ectasia of the thoracic aorta. Atherosclerotic plaque within the thoracic aorta. There is minimal linear heterogeneous subsegmental atelectasis / scar within the peripheral aspect of the left mid lung. No discrete focal airspace opacities. No pleural effusion or pneumothorax. No evidence of edema. No acute osseus abnormalities.  IMPRESSION: Cardiomegaly with  tortuosity/ectasia of the thoracic aorta without acute cardiopulmonary disease.   Electronically Signed   By: John  Watts M.D.   On: 02/10/2014 20:43    Telemetry: No adverse rhythms  Personally viewed.   EKG:  NSR 67, PAC, NSSTW changes.   Cardiac Studies:  Await cath.   Assessment/Plan:  Principal Problem:   Unstable angina Active Problems:   HTN (hypertension)   HLD (hyperlipidemia)   Hypothyroidism  80 year old with NSTEMI  NSTEMI  - Trop 0.59  - LDL 78  - Creat 0.9  - NSSTW changes  - Progressive anginal symptoms during workout (treamill 20 min). Chest, left arm, decreased exercise tolerance. Had CP at rest, EMS.  - ASA, Statin, Bb, Heparin  - Holding DAPT secondary to possible 3v CAD  - Proceed with cath (may be later this afternoon). Will give liquid breakfast.   FHX of CAD  - sister CABG  HTN  - stable, low dose metoprolol and amlodipine  Hyperlipidemia  - statin (currently low dose, will increase to higher dose)  - she has not had any issues with statins in the past.     Temperence Zenor 02/11/2014, 8:27 AM     

## 2014-02-11 NOTE — CV Procedure (Signed)
    Cardiac Catheterization Procedure Note  Name: Maryann Connerstrulia Metallo MRN: [id number] DOB: 24-Jul-1932  Procedure: Left Heart Cath, Selective Coronary Angiography, LV angiography  Indication: 80 yo BF with history of HTN and hyperlipidemia presents with a NSTEMI.   Procedural Details: The right wrist was prepped, draped, and anesthetized with 1% lidocaine. Using the modified Seldinger technique, a 6 French slender sheath was introduced into the right radial artery. 3 mg of verapamil was administered through the sheath, weight-based unfractionated heparin was administered intravenously. Standard Judkins catheters were used for selective coronary angiography and left ventriculography. Catheter exchanges were performed over an exchange length guidewire. There were no immediate procedural complications. A TR band was used for radial hemostasis at the completion of the procedure.  The patient was transferred to the post catheterization recovery area for further monitoring.  Procedural Findings: Hemodynamics: AO 154/83 mean 113 mm Hg LV 152/29 mm Hg  Coronary angiography: Coronary dominance: right  Left mainstem: Normal  Left anterior descending (LAD): The LAD is heavily calcified. There is a 95% stenosis in the proximal vessel at the takeoff of the first septal perforator. The mid LAD has a flush occlusion at the takeoff of the first and second diagonals. This part of the vessel fills by right to left collaterals to the LAD.  Ramus intermediate: large branch. Normal.   Left circumflex (LCx): The LCx has mild diffuse disease less than 20%.  Right coronary artery (RCA): The RCA is a large dominant vessel. It is also heavily calcified. There is 30% disease in the proximal vessel and PDA.   Left ventriculography: Left ventricular systolic function is abnormal. There is severe hypokinesis of the anterior wall with apical akinesis.  LVEF is estimated at 35%, there is no significant mitral regurgitation    Final Conclusions:   1. 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 There is a critical proximal stenosis with occlusion in the mid vessel. This is complex anatomy with heavy calcification, tortuosity and flush occlusion of the mid LAD. 2. Moderate to severe LV function with elevated LVEDP.  Recommendations: The patient complained of 4/10 chest pain at the end of procedure. Will initiate IV Ntg and IV lasix. She will be transferred to ICU. Will obtain Echo. Will need to consider revascularization options. PCI would be very complex with probable need to rotoblate the proximal lesion before stenting and then consider CTO intervention of the mid LAD. CABG may be a better option considering her complex anatomy and LV dysfunction.  Peter Swazil[place]Jordan, MDFACC  02/11/2014, 5:22 PM

## 2014-02-12 ENCOUNTER — Other Ambulatory Visit: Payer: Self-pay

## 2014-02-12 DIAGNOSIS — I359 Nonrheumatic aortic valve disorder, unspecified: Secondary | ICD-10-CM

## 2014-02-12 DIAGNOSIS — I25810 Atherosclerosis of coronary artery bypass graft(s) without angina pectoris: Secondary | ICD-10-CM

## 2014-02-12 DIAGNOSIS — I2510 Atherosclerotic heart disease of native coronary artery without angina pectoris: Secondary | ICD-10-CM

## 2014-02-12 LAB — BASIC METABOLIC PANEL
Anion gap: 13 (ref 5–15)
BUN: 16 mg/dL (ref 6–23)
CALCIUM: 9.9 mg/dL (ref 8.4–10.5)
CHLORIDE: 106 meq/L (ref 96–112)
CO2: 23 mEq/L (ref 19–32)
CREATININE: 0.94 mg/dL (ref 0.50–1.10)
GFR calc non Af Amer: 56 mL/min — ABNORMAL LOW (ref >90)
GFR, EST AFRICAN AMERICAN: 65 mL/min — AB (ref >90)
Glucose, Bld: 99 mg/dL (ref 70–99)
Potassium: 4 mEq/L (ref 3.7–5.3)
Sodium: 142 mEq/L (ref 137–147)

## 2014-02-12 LAB — CBC
HEMATOCRIT: 37.2 % (ref 36.0–46.0)
Hemoglobin: 12.4 g/dL (ref 12.0–15.0)
MCH: 28.1 pg (ref 26.0–34.0)
MCHC: 33.3 g/dL (ref 30.0–36.0)
MCV: 84.4 fL (ref 78.0–100.0)
Platelets: 212 10*3/uL (ref 150–400)
RBC: 4.41 MIL/uL (ref 3.87–5.11)
RDW: 15.2 % (ref 11.5–15.5)
WBC: 6.7 10*3/uL (ref 4.0–10.5)

## 2014-02-12 LAB — TROPONIN I: Troponin I: 0.99 ng/mL (ref <0.30)

## 2014-02-12 LAB — HEPARIN LEVEL (UNFRACTIONATED): HEPARIN UNFRACTIONATED: 0.26 [IU]/mL — AB (ref 0.30–0.70)

## 2014-02-12 MED ORDER — MAGNESIUM SULFATE IN D5W 10-5 MG/ML-% IV SOLN
1.0000 g | Freq: Once | INTRAVENOUS | Status: AC
  Administered 2014-02-12: 1 g via INTRAVENOUS
  Filled 2014-02-12: qty 100

## 2014-02-12 MED ORDER — POTASSIUM CHLORIDE CRYS ER 20 MEQ PO TBCR
40.0000 meq | EXTENDED_RELEASE_TABLET | Freq: Once | ORAL | Status: AC
  Administered 2014-02-12: 40 meq via ORAL
  Filled 2014-02-12: qty 2

## 2014-02-12 NOTE — Progress Notes (Signed)
Patient Name: [name] [name] Date of Encounter: 02/12/2014     Principal Problem:   Unstable angina Active Problems:   HTN (hypertension)   HLD (hyperlipidemia)   Hypothyroidism    SUBJECTIVE  The patient is doing well this am.  No further chest pain. Right radial pulse intact.  Dr. Laneta SimmersBartle will be seeing her later today.  CURRENT MEDS . atorvastatin  40 mg Oral Daily   And  . amLODipine  10 mg Oral Daily  . aspirin EC  81 mg Oral Daily  . aspirin  300 mg Rectal NOW  . cholecalciferol  2,000 Units Oral Daily  . furosemide  40 mg Intravenous Q12H  . levothyroxine  100 mcg Oral QAC breakfast  . metoprolol tartrate  12.5 mg Oral BID    OBJECTIVE  Filed Vitals:   02/12/14 0700 02/12/14 0735 02/12/14 0800 02/12/14 0825  BP: 128/88  155/52 114/73  Pulse: 62  97   Temp:  98.5 F (36.9 C)    TempSrc:  Oral    Resp: 15  24 15   Height:      Weight:      SpO2: 98%  98%     Intake/Output Summary (Last 24 hours) at 02/12/14 1021 Last data filed at 02/12/14 1000  Gross per 24 hour  Intake  988.6 ml  Output   1975 ml  Net -986.4 ml   Filed Weights   02/10/14 2340 02/11/14 1354 02/11/14 1830  Weight: 191 lb 1.6 oz (86.682 kg) 188 lb 4.4 oz (85.4 kg) 187 lb 9.8 oz (85.1 kg)    PHYSICAL EXAM  General: Pleasant, NAD. Neuro: Alert and oriented X 3. Moves all extremities spontaneously. Psych: Normal affect. HEENT:  Normal  Neck: Supple without bruits or JVD. Lungs:  Resp regular and unlabored, CTA. Heart: RRR no s3, s4, or murmurs. Abdomen: Soft, non-tender, non-distended, BS + x 4.  Extremities: No clubbing, cyanosis or edema. DP/PT/Radials 2+ and equal bilaterally.  Accessory Clinical Findings  CBC  Recent Labs  02/11/14 0545 02/12/14 0251  WBC 7.2 6.7  HGB 13.0 12.4  HCT 38.7 37.2  MCV 85.4 84.4  PLT 246 212   Basic Metabolic Panel  Recent Labs  02/10/14 2011 02/11/14 0545 02/11/14 2304  NA 141 145  --   K 4.0 4.0 3.4*  CL 103 107  --   CO2  24 25  --   GLUCOSE 128* 90  --   BUN 16 12  --   CREATININE 1.11* 0.90  --   CALCIUM 10.0 10.0  --   MG  --   --  1.8   Liver Function Tests No results found for this basename: AST, ALT, ALKPHOS, BILITOT, PROT, ALBUMIN,  in the last 72 hours No results found for this basename: LIPASE, AMYLASE,  in the last 72 hours Cardiac Enzymes  Recent Labs  02/11/14 0140 02/11/14 0545 02/11/14 1154  TROPONINI 0.59* 0.52* 1.02*   BNP No components found with this basename: POCBNP,  D-Dimer No results found for this basename: DDIMER,  in the last 72 hours Hemoglobin A1C  Recent Labs  02/11/14 0140  HGBA1C 6.1*   Fasting Lipid Panel  Recent Labs  02/11/14 0545  CHOL 143  HDL 59  LDLCALC 78  TRIG 28  CHOLHDL 2.4   Thyroid Function Tests  Recent Labs  02/11/14 0140  TSH 2.530    TELE  NSR  ECG  NSR.  Marked anterior T wave inversion new since 02/10/14  Radiology/Studies  Dg Chest 2 View  02/10/2014   CLINICAL DATA:  Shortness of breath for 1 week. Chest pain today. History of hypertension. Initial encounter.  EXAM: CHEST  2 VIEW  COMPARISON:  None.  FINDINGS: Enlarged cardiac silhouette and mediastinal contours with tortuosity and ectasia of the thoracic aorta. Atherosclerotic plaque within the thoracic aorta. There is minimal linear heterogeneous subsegmental atelectasis / scar within the peripheral aspect of the left mid lung. No discrete focal airspace opacities. No pleural effusion or pneumothorax. No evidence of edema. No acute osseus abnormalities.  IMPRESSION: Cardiomegaly with tortuosity/ectasia of the thoracic aorta without acute cardiopulmonary disease.   Electronically Signed   By: Simonne ComeJohn  Watts M.D.   On: 02/10/2014 20:43    ASSESSMENT AND PLAN  1. NSTEMI 2.Hypokalemia 3. Hypertension. 4. HLD 5. Hypothyroidism  Plan: Recheck BMET. Follow EKGs and troponins. TCTS consult pending.  Signed, Cassell Clementhomas Shreyas Piatkowski MD

## 2014-02-12 NOTE — Progress Notes (Signed)
Echocardiogram 2D Echocardiogram has been performed.  Brady Plant 02/12/2014, 3:33 PM

## 2014-02-12 NOTE — Consult Note (Signed)
[address]       [place] [address]             [phone]      Cardiothoracic Surgery Consultation   Reason for Consult: Severe single vessel coronary disease s/p NSTEMI Referring Physician: Peter Martinique, MD  [name] [name] is an 80 y.o. female.  HPI:   She has no prior heart disease history but notes a several month history of tiredness and shortness of breath, fatigue with exertion. She has been walking on a treadmill at home but over the past few weeks she has had to stop after a couple minutes due to a tired sensation in her chest and left arm. On Sunday she finished preparing a meal and afterwards developed sudden severe chest pain at rest. ECG showed nonspecific changes and Troponin was 0.52. The peak was 1.02. Cath yesterday showed the LAD to be heavily calcified with a 95% stenosis in the proximal vessel at the takeoff of the first septal. The mid LAD was occluded at the takeoff of the first and second diagonals with faint filling of the distal LAD by right to left collaterals. EF was 35% with severe anterior hypo and apical akinesis. LVEDP was 29. She had 4/10 CP at the end of the procedure that resolved with NTG. She has had none since on heparin and NTG drips.  Past Medical History  Diagnosis Date  . Hypertension   . High cholesterol     Past Surgical History  Procedure Laterality Date  . Cesarean section      Family History: [name] [name] has a total of 5 sisters and 3 brothers. One sister had a CABG at age 6. No other CAD in her family   Social History:  reports that she has never smoked. She does not have any smokeless tobacco history on file. She reports that she does not drink alcohol or use illicit drugs.  Allergies: No Known Allergies  Medications:  I have reviewed the patient's current medications. Prior to Admission:  Prescriptions prior to admission  Medication Sig Dispense Refill  . amlodipine-atorvastatin (CADUET) 10-10 MG per  tablet Take 1 tablet by mouth daily.      Marland Kitchen aspirin EC 81 MG tablet Take 81 mg by mouth daily.      . Calcium Citrate (CITRACAL PO) Take 1 tablet by mouth daily.      . Cholecalciferol (VITAMIN D3) 2000 UNITS TABS Take 2,000 Units by mouth daily.      Marland Kitchen levothyroxine (SYNTHROID, LEVOTHROID) 100 MCG tablet Take 100 mcg by mouth daily.       Scheduled: . atorvastatin  40 mg Oral Daily   And  . amLODipine  10 mg Oral Daily  . aspirin EC  81 mg Oral Daily  . aspirin  300 mg Rectal NOW  . cholecalciferol  2,000 Units Oral Daily  . furosemide  40 mg Intravenous Q12H  . levothyroxine  100 mcg Oral QAC breakfast  . metoprolol tartrate  12.5 mg Oral BID   Continuous: . sodium chloride 5 mL/hr at 02/12/14 1500  . heparin 1,100 Units/hr (02/12/14 1500)  . nitroGLYCERIN 5 mcg/min (02/12/14 1500)   BSW:[id number], nitroGLYCERIN, ondansetron (ZOFRAN) IV Anti-infectives   None      Results for orders placed during the hospital encounter of 02/10/14 (from the past 48 hour(s))  CBC     Status: None   Collection Time    02/10/14  8:11 PM  Result Value Ref Range   WBC 7.8  4.0 - 10.5 [name]/uL   RBC 4.37  3.87 - 5.11 MIL/uL   Hemoglobin 12.6  12.0 - 15.0 g/dL   HCT 37.0  36.0 - 46.0 %   MCV 84.7  78.0 - 100.0 fL   MCH 28.8  26.0 - 34.0 pg   MCHC 34.1  30.0 - 36.0 g/dL   RDW 15.3  11.5 - 15.5 %   Platelets 224  150 - 400 [name]/uL  BASIC METABOLIC PANEL     Status: Abnormal   Collection Time    02/10/14  8:11 PM      Result Value Ref Range   Sodium 141  137 - 147 mEq/L   Potassium 4.0  3.7 - 5.3 mEq/L   Chloride 103  96 - 112 mEq/L   CO2 24  19 - 32 mEq/L   Glucose, Bld 128 (*) 70 - 99 mg/dL   BUN 16  6 - 23 mg/dL   Creatinine, Ser 1.11 (*) 0.50 - 1.10 mg/dL   Calcium 10.0  8.4 - 10.5 mg/dL   GFR calc non Af Amer 46 (*) >90 mL/min   GFR calc Af Amer 53 (*) >90 mL/min   Comment: (NOTE)     The eGFR has been calculated using the CKD EPI equation.     This calculation has not been  validated in all clinical situations.     eGFR's persistently <90 mL/min signify possible Chronic Kidney     Disease.   Anion gap 14  5 - 15  I-STAT TROPOININ, ED     Status: None   Collection Time    02/10/14  8:19 PM      Result Value Ref Range   Troponin i, poc 0.05  0.00 - 0.08 ng/mL   Comment 3            Comment: Due to the release kinetics of cTnI,     a negative result within the first hours     of the onset of symptoms does not rule out     myocardial infarction with certainty.     If myocardial infarction is still suspected,     repeat the test at appropriate intervals.  TSH     Status: None   Collection Time    02/11/14  1:40 AM      Result Value Ref Range   TSH 2.530  0.350 - 4.500 uIU/mL  T4, FREE     Status: None   Collection Time    02/11/14  1:40 AM      Result Value Ref Range   Free T4 1.18  0.80 - 1.80 ng/dL   Comment: Performed at [facility]  TROPONIN I     Status: Abnormal   Collection Time    02/11/14  1:40 AM      Result Value Ref Range   Troponin I 0.59 (*) <0.30 ng/mL   Comment:            Due to the release kinetics of cTnI,     a negative result within the first hours     of the onset of symptoms does not rule out     myocardial infarction with certainty.     If myocardial infarction is still suspected,     repeat the test at appropriate intervals.     CRITICAL RESULT CALLED TO, READ BACK BY AND VERIFIED WITH:     JENKINS,D RN 02/11/2014  0234 JORDANS  HEMOGLOBIN A1C     Status: Abnormal   Collection Time    02/11/14  1:40 AM      Result Value Ref Range   Hemoglobin A1C 6.1 (*) <5.7 %   Comment: (NOTE)                                                                               According to the ADA Clinical Practice Recommendations for 2011, when     HbA1c is used as a screening test:      >=6.5%   Diagnostic of Diabetes Mellitus               (if abnormal result is confirmed)     5.7-6.4%   Increased risk of developing Diabetes  Mellitus     References:Diagnosis and Classification of Diabetes Mellitus,Diabetes     OINO,6767,20(NOBSJ 1):S62-S69 and Standards of Medical Care in             Diabetes - 2011,Diabetes Care,2011,34 (Suppl 1):S11-S61.   Mean Plasma Glucose 128 (*) <117 mg/dL   Comment: Performed at [facility]  TROPONIN I     Status: Abnormal   Collection Time    02/11/14  5:45 AM      Result Value Ref Range   Troponin I 0.52 (*) <0.30 ng/mL   Comment:            Due to the release kinetics of cTnI,     a negative result within the first hours     of the onset of symptoms does not rule out     myocardial infarction with certainty.     If myocardial infarction is still suspected,     repeat the test at appropriate intervals.     CRITICAL VALUE NOTED.  VALUE IS CONSISTENT WITH PREVIOUSLY REPORTED AND CALLED VALUE.  BASIC METABOLIC PANEL     Status: Abnormal   Collection Time    02/11/14  5:45 AM      Result Value Ref Range   Sodium 145  137 - 147 mEq/L   Potassium 4.0  3.7 - 5.3 mEq/L   Chloride 107  96 - 112 mEq/L   CO2 25  19 - 32 mEq/L   Glucose, Bld 90  70 - 99 mg/dL   BUN 12  6 - 23 mg/dL   Creatinine, Ser 0.90  0.50 - 1.10 mg/dL   Calcium 10.0  8.4 - 10.5 mg/dL   GFR calc non Af Amer 59 (*) >90 mL/min   GFR calc Af Amer 69 (*) >90 mL/min   Comment: (NOTE)     The eGFR has been calculated using the CKD EPI equation.     This calculation has not been validated in all clinical situations.     eGFR's persistently <90 mL/min signify possible Chronic Kidney     Disease.   Anion gap 13  5 - 15  LIPID PANEL     Status: None   Collection Time    02/11/14  5:45 AM      Result Value Ref Range   Cholesterol 143  0 - 200 mg/dL   Triglycerides 28  <150 mg/dL  HDL 59  >39 mg/dL   Total CHOL/HDL Ratio 2.4     VLDL 6  0 - 40 mg/dL   LDL Cholesterol 78  0 - 99 mg/dL   Comment:            Total Cholesterol/HDL:CHD Risk     Coronary Heart Disease Risk Table                         Men    Women      1/2 Average Risk   3.4   3.3      Average Risk       5.0   4.4      2 X Average Risk   9.6   7.1      3 X Average Risk  23.4   11.0                Use the calculated Patient Ratio     above and the CHD Risk Table     to determine the patient's CHD Risk.                ATP III CLASSIFICATION (LDL):      <100     mg/dL   Optimal      100-129  mg/dL   Near or Above                        Optimal      130-159  mg/dL   Borderline      160-189  mg/dL   High      >190     mg/dL   Very High  CBC     Status: None   Collection Time    02/11/14  5:45 AM      Result Value Ref Range   WBC 7.2  4.0 - 10.5 [name]/uL   RBC 4.53  3.87 - 5.11 MIL/uL   Hemoglobin 13.0  12.0 - 15.0 g/dL   HCT 38.7  36.0 - 46.0 %   MCV 85.4  78.0 - 100.0 fL   MCH 28.7  26.0 - 34.0 pg   MCHC 33.6  30.0 - 36.0 g/dL   RDW 15.4  11.5 - 15.5 %   Platelets 246  150 - 400 [name]/uL  TROPONIN I     Status: Abnormal   Collection Time    02/11/14 11:54 AM      Result Value Ref Range   Troponin I 1.02 (*) <0.30 ng/mL   Comment:            Due to the release kinetics of cTnI,     a negative result within the first hours     of the onset of symptoms does not rule out     myocardial infarction with certainty.     If myocardial infarction is still suspected,     repeat the test at appropriate intervals.     CRITICAL VALUE NOTED.  VALUE IS CONSISTENT WITH PREVIOUSLY REPORTED AND CALLED VALUE.  HEPARIN LEVEL (UNFRACTIONATED)     Status: None   Collection Time    02/11/14 11:54 AM      Result Value Ref Range   Heparin Unfractionated 0.68  0.30 - 0.70 IU/mL   Comment:            IF HEPARIN RESULTS ARE BELOW     EXPECTED VALUES, AND PATIENT     DOSAGE  HAS BEEN CONFIRMED,     SUGGEST FOLLOW UP TESTING     OF ANTITHROMBIN III LEVELS.  MRSA PCR SCREENING     Status: None   Collection Time    02/11/14  6:32 PM      Result Value Ref Range   MRSA by PCR NEGATIVE  NEGATIVE   Comment:            The GeneXpert MRSA Assay  (FDA     approved for NASAL specimens     only), is one component of a     comprehensive MRSA colonization     surveillance program. It is not     intended to diagnose MRSA     infection nor to guide or     monitor treatment for     MRSA infections.  MAGNESIUM     Status: None   Collection Time    02/11/14 11:04 PM      Result Value Ref Range   Magnesium 1.8  1.5 - 2.5 mg/dL  POTASSIUM     Status: Abnormal   Collection Time    02/11/14 11:04 PM      Result Value Ref Range   Potassium 3.4 (*) 3.7 - 5.3 mEq/L  CBC     Status: None   Collection Time    02/12/14  2:51 AM      Result Value Ref Range   WBC 6.7  4.0 - 10.5 [name]/uL   RBC 4.41  3.87 - 5.11 MIL/uL   Hemoglobin 12.4  12.0 - 15.0 g/dL   HCT 37.2  36.0 - 46.0 %   MCV 84.4  78.0 - 100.0 fL   MCH 28.1  26.0 - 34.0 pg   MCHC 33.3  30.0 - 36.0 g/dL   RDW 15.2  11.5 - 15.5 %   Platelets 212  150 - 400 [name]/uL  HEPARIN LEVEL (UNFRACTIONATED)     Status: Abnormal   Collection Time    02/12/14 12:10 PM      Result Value Ref Range   Heparin Unfractionated 0.26 (*) 0.30 - 0.70 IU/mL   Comment:            IF HEPARIN RESULTS ARE BELOW     EXPECTED VALUES, AND PATIENT     DOSAGE HAS BEEN CONFIRMED,     SUGGEST FOLLOW UP TESTING     OF ANTITHROMBIN III LEVELS.  BASIC METABOLIC PANEL     Status: Abnormal   Collection Time    02/12/14 12:10 PM      Result Value Ref Range   Sodium 142  137 - 147 mEq/L   Potassium 4.0  3.7 - 5.3 mEq/L   Chloride 106  96 - 112 mEq/L   CO2 23  19 - 32 mEq/L   Glucose, Bld 99  70 - 99 mg/dL   BUN 16  6 - 23 mg/dL   Creatinine, Ser 0.94  0.50 - 1.10 mg/dL   Calcium 9.9  8.4 - 10.5 mg/dL   GFR calc non Af Amer 56 (*) >90 mL/min   GFR calc Af Amer 65 (*) >90 mL/min   Comment: (NOTE)     The eGFR has been calculated using the CKD EPI equation.     This calculation has not been validated in all clinical situations.     eGFR's persistently <90 mL/min signify possible Chronic Kidney     Disease.    Anion gap 13  5 - 15  TROPONIN I  Status: Abnormal   Collection Time    02/12/14 12:10 PM      Result Value Ref Range   Troponin I 0.99 (*) <0.30 ng/mL   Comment:            Due to the release kinetics of cTnI,     a negative result within the first hours     of the onset of symptoms does not rule out     myocardial infarction with certainty.     If myocardial infarction is still suspected,     repeat the test at appropriate intervals.     CRITICAL VALUE NOTED.  VALUE IS CONSISTENT WITH PREVIOUSLY REPORTED AND CALLED VALUE.    Dg Chest 2 View  02/10/2014   CLINICAL DATA:  Shortness of breath for 1 week. Chest pain today. History of hypertension. Initial encounter.  EXAM: CHEST  2 VIEW  COMPARISON:  None.  FINDINGS: Enlarged cardiac silhouette and mediastinal contours with tortuosity and ectasia of the thoracic aorta. Atherosclerotic plaque within the thoracic aorta. There is minimal linear heterogeneous subsegmental atelectasis / scar within the peripheral aspect of the left mid lung. No discrete focal airspace opacities. No pleural effusion or pneumothorax. No evidence of edema. No acute osseus abnormalities.  IMPRESSION: Cardiomegaly with tortuosity/ectasia of the thoracic aorta without acute cardiopulmonary disease.   Electronically Signed   By: Sandi Mariscal M.D.   On: 02/10/2014 20:43    Review of Systems  Constitutional: Positive for malaise/fatigue. Negative for fever and chills.  Eyes: Negative.   Respiratory: Positive for shortness of breath. Negative for cough and hemoptysis.   Cardiovascular: Positive for chest pain. Negative for palpitations, orthopnea, claudication, leg swelling and PND.  Gastrointestinal: Negative.   Genitourinary: Negative.   Musculoskeletal: Negative.   Skin: Negative.   Neurological: Negative.   Endo/Heme/Allergies: Negative.   Psychiatric/Behavioral: Negative.    Blood pressure 102/56, pulse 47, temperature 98.9 F (37.2 C), temperature source  Oral, resp. rate 23, height '5\' 5"'  (1.651 m), weight 85.1 kg (187 lb 9.8 oz), SpO2 100.00%. Physical Exam  Constitutional: She is oriented to person, place, and time. She appears well-developed and well-nourished. No distress.  HENT:  Head: Normocephalic and atraumatic.  Eyes: EOM are normal. Pupils are equal, round, and reactive to light.  Neck: Normal range of motion. Neck supple. No JVD present. No thyromegaly present.  Cardiovascular: Normal rate, regular rhythm, normal heart sounds and intact distal pulses.   No murmur heard. Respiratory: Effort normal and breath sounds normal. No respiratory distress. She has no rales.  GI: Soft. Bowel sounds are normal. She exhibits no distension and no mass. There is no tenderness.  Musculoskeletal: She exhibits no edema.  Neurological: She is alert and oriented to person, place, and time. She has normal strength. No cranial nerve deficit or sensory deficit.  Skin: Skin is warm and dry.  Psychiatric: She has a normal mood and affect.   Cardiac Catheterization Procedure Note  Name: [name] [name]  MRN: [id number]  DOB: 1932/09/02  Procedure: Left Heart Cath, Selective Coronary Angiography, LV angiography  Indication: 80 yo BF with history of HTN and hyperlipidemia presents with a NSTEMI.  Procedural Details: The right wrist was prepped, draped, and anesthetized with 1% lidocaine. Using the modified Seldinger technique, a 6 French slender sheath was introduced into the right radial artery. 3 mg of verapamil was administered through the sheath, weight-based unfractionated heparin was administered intravenously. Standard Judkins catheters were used for selective coronary angiography and left ventriculography.  Catheter exchanges were performed over an exchange length guidewire. There were no immediate procedural complications. A TR band was used for radial hemostasis at the completion of the procedure. The patient was transferred to the post catheterization  recovery area for further monitoring.  Procedural Findings:  Hemodynamics:  AO 154/83 mean 113 mm Hg  LV 152/29 mm Hg  Coronary angiography:  Coronary dominance: right  Left mainstem: Normal  Left anterior descending (LAD): The LAD is heavily calcified. There is a 95% stenosis in the proximal vessel at the takeoff of the first septal perforator. The mid LAD has a flush occlusion at the takeoff of the first and second diagonals. This part of the vessel fills by right to left collaterals to the LAD.  Ramus intermediate: large branch. Normal.  Left circumflex (LCx): The LCx has mild diffuse disease less than 20%.  Right coronary artery (RCA): The RCA is a large dominant vessel. It is also heavily calcified. There is 30% disease in the proximal vessel and PDA.  Left ventriculography: Left ventricular systolic function is abnormal. There is severe hypokinesis of the anterior wall with apical akinesis. LVEF is estimated at 35%, there is no significant mitral regurgitation  Final Conclusions:  1. 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 There is a critical proximal stenosis with occlusion in the mid vessel. This is complex anatomy with heavy calcification, tortuosity and flush occlusion of the mid LAD.  2. Moderate to severe LV function with elevated LVEDP.  Recommendations: The patient complained of 4/10 chest pain at the end of procedure. Will initiate IV Ntg and IV lasix. She will be transferred to ICU. Will obtain Echo. Will need to consider revascularization options. PCI would be very complex with probable need to rotoblate the proximal lesion before stenting and then consider CTO intervention of the mid LAD. CABG may be a better option considering her complex anatomy and LV dysfunction.  Peter Martinique, [place]  02/11/2014, 5:22 PM   Assessment/Plan:  1. Severe single vessel coronary disease involving the LAD, s/p NSTEMI. I suspect that the LAD occluded at some time in the past  and now the tight proximal LAD lesion is causing problems by compromising the septal and diagonals. I agree with Dr. Martinique that CABG is indicated to improve her symptoms and return her to full functional status, as well as to preserve myocardium.  2. HTN  3. Hyperlipidemia  4. Hypothyroidism.   I discussed the operative procedure of coronary artery bypass graft surgery with the patient and her daughter and sister including alternatives, benefits and risks; including but not limited to bleeding, blood transfusion, infection, stroke, myocardial infarction, graft failure, heart block requiring a permanent pacemaker, organ dysfunction, and death.  [name] [name] understands and agrees to proceed.  We will schedule surgery for Friday morning. [name] [name] 02/12/2014, 3:27 PM

## 2014-02-12 NOTE — Progress Notes (Signed)
ANTICOAGULATION CONSULT NOTE - Follow Up Consult  Pharmacy Consult for Heparin Indication: chest pain/ACS/NSTEMI  No Known Allergies  Patient Measurements: Height: 5\' 5"  (165.1 cm) Weight: 187 lb 9.8 oz (85.1 kg) IBW/kg (Calculated) : 57 Heparin Dosing Weight: 67 kg  Vital Signs: Temp: 98.9 F (37.2 C) (10/13 1130) Temp Source: Oral (10/13 1130) BP: 102/56 mmHg (10/13 1130) Pulse Rate: 47 (10/13 1100)  Labs:  Recent Labs  02/10/14 2011  02/11/14 0545 02/11/14 1154 02/12/14 0251 02/12/14 1210  HGB 12.6  --  13.0  --  12.4  --   HCT 37.0  --  38.7  --  37.2  --   PLT 224  --  246  --  212  --   HEPARINUNFRC  --   --   --  0.68  --  0.26*  CREATININE 1.11*  --  0.90  --   --  0.94  TROPONINI  --   < > 0.52* 1.02*  --  0.99*  < > = values in this interval not displayed.  Estimated Creatinine Clearance: 52.2 ml/min (by C-G formula based on Cr of 0.94).  Assessment: 80 year old female s/p cath Evaluating for possible CABG Heparin restarted early this AM  Heparin level now 0.26  Goal of Therapy:  Heparin level 0.3-0.7 units/ml Monitor platelets by anticoagulation protocol: Yes   Plan:  Increase heparin to 1100 units / hr Follow up AM labs  Thank you. Okey RegalLisa Sidharth Leverette, PharmD [phone]

## 2014-02-13 ENCOUNTER — Inpatient Hospital Stay (HOSPITAL_COMMUNITY): Payer: Medicare PPO

## 2014-02-13 DIAGNOSIS — Z01810 Encounter for preprocedural cardiovascular examination: Secondary | ICD-10-CM

## 2014-02-13 LAB — PULMONARY FUNCTION TEST
FEF 25-75 Post: 0.97 L/sec
FEF 25-75 Pre: 1.34 L/sec
FEF2575-%CHANGE-POST: -27 %
FEF2575-%PRED-POST: 69 %
FEF2575-%PRED-PRE: 96 %
FEV1-%Change-Post: -11 %
FEV1-%PRED-PRE: 83 %
FEV1-%Pred-Post: 73 %
FEV1-POST: 1.23 L
FEV1-PRE: 1.39 L
FEV1FVC-%Change-Post: -9 %
FEV1FVC-%PRED-PRE: 108 %
FEV6-%CHANGE-POST: 0 %
FEV6-%PRED-POST: 80 %
FEV6-%Pred-Pre: 79 %
FEV6-Post: 1.66 L
FEV6-Pre: 1.64 L
FEV6FVC-%Pred-Post: 104 %
FEV6FVC-%Pred-Pre: 104 %
FVC-%Change-Post: -2 %
FVC-%PRED-POST: 77 %
FVC-%Pred-Pre: 79 %
FVC-Post: 1.68 L
FVC-Pre: 1.71 L
POST FEV1/FVC RATIO: 73 %
PRE FEV1/FVC RATIO: 81 %
Post FEV6/FVC ratio: 100 %
Pre FEV6/FVC Ratio: 100 %

## 2014-02-13 LAB — CBC
HCT: 39.2 % (ref 36.0–46.0)
Hemoglobin: 13.2 g/dL (ref 12.0–15.0)
MCH: 28.3 pg (ref 26.0–34.0)
MCHC: 33.7 g/dL (ref 30.0–36.0)
MCV: 84.1 fL (ref 78.0–100.0)
Platelets: 237 10*3/uL (ref 150–400)
RBC: 4.66 MIL/uL (ref 3.87–5.11)
RDW: 15.5 % (ref 11.5–15.5)
WBC: 6.4 10*3/uL (ref 4.0–10.5)

## 2014-02-13 LAB — HEPARIN LEVEL (UNFRACTIONATED): Heparin Unfractionated: 0.31 IU/mL (ref 0.30–0.70)

## 2014-02-13 MED ORDER — AMLODIPINE BESYLATE 5 MG PO TABS
5.0000 mg | ORAL_TABLET | Freq: Every day | ORAL | Status: DC
  Administered 2014-02-14: 5 mg via ORAL
  Filled 2014-02-13 (×2): qty 1

## 2014-02-13 MED ORDER — FUROSEMIDE 40 MG PO TABS
40.0000 mg | ORAL_TABLET | Freq: Every day | ORAL | Status: DC
  Administered 2014-02-14: 40 mg via ORAL
  Filled 2014-02-13 (×2): qty 1

## 2014-02-13 MED ORDER — ATORVASTATIN CALCIUM 40 MG PO TABS
40.0000 mg | ORAL_TABLET | Freq: Every day | ORAL | Status: DC
  Administered 2014-02-14 – 2014-02-21 (×7): 40 mg via ORAL
  Filled 2014-02-13 (×8): qty 1

## 2014-02-13 MED ORDER — ALBUTEROL SULFATE (2.5 MG/3ML) 0.083% IN NEBU
2.5000 mg | INHALATION_SOLUTION | Freq: Once | RESPIRATORY_TRACT | Status: AC
  Administered 2014-02-13: 2.5 mg via RESPIRATORY_TRACT

## 2014-02-13 NOTE — Progress Notes (Addendum)
VASCULAR LAB PRELIMINARY  PRELIMINARY  PRELIMINARY  PRELIMINARY  Pre-op Cardiac Surgery  Carotid Findings:  Bilateral:  1-39% ICA stenosis.  Vertebral artery flow is antegrade.    Thereasa ParkinHelene Cestone, RVT 02/13/2014 12:44 PM      Upper Extremity Right Left  Brachial Pressures 118 triphasic 120 triphasic  Radial Waveforms triphasic triphasic  Ulnar Waveforms triphasic triphasic  Palmar Arch (Allen's Test) * **   Findings:  *Right:  Doppler waveforms obliterate with ulnar and radial compressions.  **Left:  Doppler waveforms remain normal with ulnar and radial compressions.    Lower  Extremity Right Left  Dorsalis Pedis    Anterior Tibial    Posterior Tibial    Ankle/Brachial Indices      Findings:  Palpable pedal pulses x 4.

## 2014-02-13 NOTE — Progress Notes (Signed)
ANTICOAGULATION CONSULT NOTE - Follow Up Consult  Pharmacy Consult for Heparin Indication: chest pain/ACS/NSTEMI  No Known Allergies  Patient Measurements: Height: 5\' 5"  (165.1 cm) Weight: 187 lb 9.8 oz (85.1 kg) IBW/kg (Calculated) : 57 Heparin Dosing Weight: 67 kg  Vital Signs: Temp: 98.5 F (36.9 C) (10/14 0700) Temp Source: Oral (10/14 0700) BP: 91/53 mmHg (10/14 0800) Pulse Rate: 54 (10/14 0800)  Labs:  Recent Labs  02/10/14 2011  02/11/14 0545 02/11/14 1154 02/12/14 0251 02/12/14 1210 02/13/14 0222  HGB 12.6  --  13.0  --  12.4  --  13.2  HCT 37.0  --  38.7  --  37.2  --  39.2  PLT 224  --  246  --  212  --  237  HEPARINUNFRC  --   --   --  0.68  --  0.26* 0.31  CREATININE 1.11*  --  0.90  --   --  0.94  --   TROPONINI  --   < > 0.52* 1.02*  --  0.99*  --   < > = values in this interval not displayed.  Estimated Creatinine Clearance: 52.2 ml/min (by C-G formula based on Cr of 0.94).  Assessment: 80 year old female s/p cath Evaluating for possible CABG-->planned for Friday Heparin level now 0.31 CBC stable  Goal of Therapy:  Heparin level 0.3-0.7 units/ml Monitor platelets by anticoagulation protocol: Yes   Plan:  Continue heparin at 1100 units / hr Follow up AM labs  Thank you. Okey RegalLisa Ewa Hipp, PharmD [phone]

## 2014-02-13 NOTE — Plan of Care (Signed)
Problem: Consults Goal: Cardiac Surgery Patient Education ( See Patient Education module for education specifics.) Outcome: Progressing Has watched videos and has received cardiac surgery education booklet

## 2014-02-13 NOTE — Progress Notes (Signed)
Patient Name: [name] [name] Date of Encounter: 02/13/2014     Principal Problem:   Unstable angina Active Problems:   HTN (hypertension)   HLD (hyperlipidemia)   Hypothyroidism    SUBJECTIVE  Patient feels well.  Awaiting CABG. BP low during the night and IV nitro had to be stopped. No chest pain or dyspnea. Rhythm NSR.  CURRENT MEDS . [START ON 02/14/2014] amLODipine  5 mg Oral Daily   And  . [START ON 02/14/2014] atorvastatin  40 mg Oral Daily  . aspirin EC  81 mg Oral Daily  . cholecalciferol  2,000 Units Oral Daily  . [START ON 02/14/2014] furosemide  40 mg Oral Daily  . levothyroxine  100 mcg Oral QAC breakfast  . metoprolol tartrate  12.5 mg Oral BID    OBJECTIVE  Filed Vitals:   02/13/14 0900 02/13/14 1000 02/13/14 1014 02/13/14 1100  BP: 112/50 115/44 115/44 101/68  Pulse: 60 51 72 56  Temp:    98.1 F (36.7 C)  TempSrc:    Oral  Resp:    18  Height:      Weight:      SpO2: 93% 95% 96% 96%    Intake/Output Summary (Last 24 hours) at 02/13/14 1154 Last data filed at 02/13/14 1100  Gross per 24 hour  Intake  609.5 ml  Output    200 ml  Net  409.5 ml   Filed Weights   02/10/14 2340 02/11/14 1354 02/11/14 1830  Weight: 191 lb 1.6 oz (86.682 kg) 188 lb 4.4 oz (85.4 kg) 187 lb 9.8 oz (85.1 kg)    PHYSICAL EXAM  General: Pleasant, NAD. Neuro: Alert and oriented X 3. Moves all extremities spontaneously. Psych: Normal affect. HEENT:  Normal  Neck: Supple without bruits or JVD. Lungs:  Resp regular and unlabored, CTA. Heart: RRR no s3, s4, or murmurs. Abdomen: Soft, non-tender, non-distended, BS + x 4.  Extremities: No clubbing, cyanosis or edema. DP/PT/Radials 2+ and equal bilaterally.  Accessory Clinical Findings  CBC  Recent Labs  02/12/14 0251 02/13/14 0222  WBC 6.7 6.4  HGB 12.4 13.2  HCT 37.2 39.2  MCV 84.4 84.1  PLT 212 237   Basic Metabolic Panel  Recent Labs  02/11/14 0545 02/11/14 2304 02/12/14 1210  NA 145  --  142    K 4.0 3.4* 4.0  CL 107  --  106  CO2 25  --  23  GLUCOSE 90  --  99  BUN 12  --  16  CREATININE 0.90  --  0.94  CALCIUM 10.0  --  9.9  MG  --  1.8  --    Liver Function Tests No results found for this basename: AST, ALT, ALKPHOS, BILITOT, PROT, ALBUMIN,  in the last 72 hours No results found for this basename: LIPASE, AMYLASE,  in the last 72 hours Cardiac Enzymes  Recent Labs  02/11/14 0545 02/11/14 1154 02/12/14 1210  TROPONINI 0.52* 1.02* 0.99*   BNP No components found with this basename: POCBNP,  D-Dimer No results found for this basename: DDIMER,  in the last 72 hours Hemoglobin A1C  Recent Labs  02/11/14 0140  HGBA1C 6.1*   Fasting Lipid Panel  Recent Labs  02/11/14 0545  CHOL 143  HDL 59  LDLCALC 78  TRIG 28  CHOLHDL 2.4   Thyroid Function Tests  Recent Labs  02/11/14 0140  TSH 2.530    TELE  Sinus bradycardia.  ECG   2D Echo: - Left ventricle:  The cavity size was mildly dilated. Wall thickness was normal. Systolic function was normal. The estimated ejection fraction was in the range of 50% to 55%. Wall motion was normal; there were no regional wall motion abnormalities. Doppler parameters are consistent with abnormal left ventricular relaxation (grade 1 diastolic dysfunction). - Aortic valve: There was mild regurgitation. - Ascending aorta: The ascending aorta was mildly dilated. - Mitral valve: There was mild regurgitation. - Left atrium: The atrium was mildly dilated. - Pulmonary arteries: Systolic pressure was mildly increased. PA peak pressure: 46 mm Hg (S).  Impressions:  - Low normal to mildly reduced LV function; EF 50; grade 1 diastolic dysfunction; mild LAE; mild MR and AI; mildly elevated pulmonary pressure.   Radiology/Studies  Dg Chest 2 View  02/10/2014   CLINICAL DATA:  Shortness of breath for 1 week. Chest pain today. History of hypertension. Initial encounter.  EXAM: CHEST  2 VIEW  COMPARISON:  None.   FINDINGS: Enlarged cardiac silhouette and mediastinal contours with tortuosity and ectasia of the thoracic aorta. Atherosclerotic plaque within the thoracic aorta. There is minimal linear heterogeneous subsegmental atelectasis / scar within the peripheral aspect of the left mid lung. No discrete focal airspace opacities. No pleural effusion or pneumothorax. No evidence of edema. No acute osseus abnormalities.  IMPRESSION: Cardiomegaly with tortuosity/ectasia of the thoracic aorta without acute cardiopulmonary disease.   Electronically Signed   By: Simonne ComeJohn  Watts M.D.   On: 02/10/2014 20:43    ASSESSMENT AND PLAN 1. NSTEMI.  For CABG Friday.  Appreciate Dr. Sharee PimpleBartle's consult.  2.Hypokalemia, resolved.  3. Hypertension. EF 50-55% with grade 1 diastolic dysfunction 4. HLD  5. Hypothyroidism  Plan: With soft BP will reduce lasix to 40 mg orally. Will reduce amlodipine to 5 mg daily. Consider adding ACEi once BP is higher.  Signed, Cassell Clementhomas Shaley Leavens MD

## 2014-02-13 NOTE — Progress Notes (Signed)
[id number] Discussed with pt and family importance of IS and mobility after surgery. Pt stated can get 1250 ml on IS. Discussed sternal precautions. Pt has [facility] booklet and has watched pre and post surgery videos. Gave care guide. Pt has family who will be with her 24/7 after discharge. Pt stated has strong knees and was walking 20 minutes on treadmill every morning before her exercise tolerance went down   Will follow up after surgery.[name] Nuttingharlene [name] Arpin RN BSN 02/13/2014 1:43 PM '

## 2014-02-13 NOTE — Care Management Note (Addendum)
    Page 1 of 1   02/21/2014     4:53:15 PM CARE MANAGEMENT NOTE 02/21/2014  Patient:  [name] [name] [name],[name] [name] [name]   Account Number:  [id number]  Date Initiated:  02/13/2014  Documentation initiated by:  [name] [name] [name],[name] [name]  Subjective/Objective Assessment:   adm w angina     Action/Plan:   lives alone, pcp dr [name] [name] [name]   Anticipated DC Date:  02/21/2014   Anticipated DC Plan:  HOME/SELF CARE      DC Planning Services  CM consult      Choice offered to / List presented to:             Status of service:  Completed, signed off Medicare Important Message given?  YES (If response is "NO", the following Medicare IM given date fields will be blank) Date Medicare IM given:  02/13/2014 Medicare IM given by:  [name] [name] [name],[name] [name] Date Additional Medicare IM given:  02/20/2014 Additional Medicare IM given by:  [name] [name] [name]  Discharge Disposition:  HOME/SELF CARE  Per UR Regulation:  Reviewed for med. necessity/level of care/duration of stay  If discussed at Long Length of Stay Meetings, dates discussed:   02/21/2014    Comments:  02/21/14 [name] AceJulie Takela Varden, [name] [name], [name] [name] [name] [name] [name] Pt for dc home today with family.  Family to provide 24h care at dc; states pt has all DME needed at home.  Denies need for any home health follow up.

## 2014-02-14 DIAGNOSIS — Z01810 Encounter for preprocedural cardiovascular examination: Secondary | ICD-10-CM

## 2014-02-14 LAB — CBC
HCT: 38.5 % (ref 36.0–46.0)
Hemoglobin: 13.1 g/dL (ref 12.0–15.0)
MCH: 28.4 pg (ref 26.0–34.0)
MCHC: 34 g/dL (ref 30.0–36.0)
MCV: 83.5 fL (ref 78.0–100.0)
Platelets: 212 10*3/uL (ref 150–400)
RBC: 4.61 MIL/uL (ref 3.87–5.11)
RDW: 15.2 % (ref 11.5–15.5)
WBC: 6.3 10*3/uL (ref 4.0–10.5)

## 2014-02-14 LAB — BASIC METABOLIC PANEL
Anion gap: 11 (ref 5–15)
BUN: 20 mg/dL (ref 6–23)
CO2: 25 mEq/L (ref 19–32)
Calcium: 9.6 mg/dL (ref 8.4–10.5)
Chloride: 104 mEq/L (ref 96–112)
Creatinine, Ser: 0.91 mg/dL (ref 0.50–1.10)
GFR calc Af Amer: 68 mL/min — ABNORMAL LOW (ref >90)
GFR calc non Af Amer: 58 mL/min — ABNORMAL LOW (ref >90)
Glucose, Bld: 96 mg/dL (ref 70–99)
Potassium: 3.7 mEq/L (ref 3.7–5.3)
SODIUM: 140 meq/L (ref 137–147)

## 2014-02-14 LAB — SURGICAL PCR SCREEN
MRSA, PCR: NEGATIVE
STAPHYLOCOCCUS AUREUS: NEGATIVE

## 2014-02-14 LAB — HEPARIN LEVEL (UNFRACTIONATED)
HEPARIN UNFRACTIONATED: 0.55 [IU]/mL (ref 0.30–0.70)
HEPARIN UNFRACTIONATED: 0.64 [IU]/mL (ref 0.30–0.70)
Heparin Unfractionated: 1.02 IU/mL — ABNORMAL HIGH (ref 0.30–0.70)

## 2014-02-14 LAB — TYPE AND SCREEN
ABO/RH(D): A POS
ANTIBODY SCREEN: NEGATIVE

## 2014-02-14 LAB — ABO/RH: ABO/RH(D): A POS

## 2014-02-14 MED ORDER — MAGNESIUM SULFATE 50 % IJ SOLN
40.0000 meq | INTRAMUSCULAR | Status: DC
  Filled 2014-02-14: qty 10

## 2014-02-14 MED ORDER — METOPROLOL TARTRATE 12.5 MG HALF TABLET
12.5000 mg | ORAL_TABLET | Freq: Once | ORAL | Status: AC
  Administered 2014-02-15: 12.5 mg via ORAL
  Filled 2014-02-14: qty 1

## 2014-02-14 MED ORDER — DEXTROSE 5 % IV SOLN
750.0000 mg | INTRAVENOUS | Status: DC
  Filled 2014-02-14 (×2): qty 750

## 2014-02-14 MED ORDER — POLYETHYLENE GLYCOL 3350 17 G PO PACK
17.0000 g | PACK | Freq: Every day | ORAL | Status: DC | PRN
  Administered 2014-02-14: 17 g via ORAL
  Filled 2014-02-14 (×2): qty 1

## 2014-02-14 MED ORDER — POTASSIUM CHLORIDE 2 MEQ/ML IV SOLN
80.0000 meq | INTRAVENOUS | Status: DC
  Filled 2014-02-14: qty 40

## 2014-02-14 MED ORDER — TEMAZEPAM 15 MG PO CAPS
15.0000 mg | ORAL_CAPSULE | Freq: Once | ORAL | Status: AC | PRN
  Administered 2014-02-14: 15 mg via ORAL
  Filled 2014-02-14: qty 1

## 2014-02-14 MED ORDER — CHLORHEXIDINE GLUCONATE CLOTH 2 % EX PADS
6.0000 | MEDICATED_PAD | Freq: Once | CUTANEOUS | Status: AC
  Administered 2014-02-14: 6 via TOPICAL

## 2014-02-14 MED ORDER — BISACODYL 5 MG PO TBEC
5.0000 mg | DELAYED_RELEASE_TABLET | Freq: Once | ORAL | Status: AC
  Administered 2014-02-14: 5 mg via ORAL

## 2014-02-14 MED ORDER — SODIUM CHLORIDE 0.9 % IV SOLN
INTRAVENOUS | Status: AC
  Administered 2014-02-15: 140 mL/h via INTRAVENOUS
  Filled 2014-02-14 (×2): qty 40

## 2014-02-14 MED ORDER — NITROGLYCERIN IN D5W 200-5 MCG/ML-% IV SOLN
2.0000 ug/min | INTRAVENOUS | Status: AC
  Administered 2014-02-15: 16.6 ug/min via INTRAVENOUS
  Filled 2014-02-14: qty 250

## 2014-02-14 MED ORDER — SODIUM CHLORIDE 0.9 % IV SOLN
INTRAVENOUS | Status: AC
  Administered 2014-02-15: 1.9 [IU]/h via INTRAVENOUS
  Filled 2014-02-14 (×2): qty 2.5

## 2014-02-14 MED ORDER — SODIUM CHLORIDE 0.9 % IJ SOLN
3.0000 mL | Freq: Two times a day (BID) | INTRAMUSCULAR | Status: DC
  Administered 2014-02-14: 3 mL via INTRAVENOUS

## 2014-02-14 MED ORDER — CHLORHEXIDINE GLUCONATE CLOTH 2 % EX PADS
6.0000 | MEDICATED_PAD | Freq: Once | CUTANEOUS | Status: AC
  Administered 2014-02-15: 6 via TOPICAL

## 2014-02-14 MED ORDER — VANCOMYCIN HCL 10 G IV SOLR
1250.0000 mg | INTRAVENOUS | Status: AC
  Administered 2014-02-15: 1250 mg via INTRAVENOUS
  Filled 2014-02-14: qty 1250

## 2014-02-14 MED ORDER — PHENYLEPHRINE HCL 10 MG/ML IJ SOLN
30.0000 ug/min | INTRAVENOUS | Status: DC
  Filled 2014-02-14 (×2): qty 2

## 2014-02-14 MED ORDER — PLASMA-LYTE 148 IV SOLN
INTRAVENOUS | Status: AC
  Administered 2014-02-15: 09:00:00
  Filled 2014-02-14 (×2): qty 2.5

## 2014-02-14 MED ORDER — EPINEPHRINE HCL 1 MG/ML IJ SOLN
0.5000 ug/min | INTRAVENOUS | Status: DC
  Filled 2014-02-14: qty 4

## 2014-02-14 MED ORDER — DEXMEDETOMIDINE HCL IN NACL 400 MCG/100ML IV SOLN
0.1000 ug/kg/h | INTRAVENOUS | Status: AC
  Administered 2014-02-15: 0.2 ug/kg/h via INTRAVENOUS
  Filled 2014-02-14: qty 100

## 2014-02-14 MED ORDER — DIAZEPAM 5 MG PO TABS
5.0000 mg | ORAL_TABLET | Freq: Once | ORAL | Status: AC
  Administered 2014-02-15: 5 mg via ORAL
  Filled 2014-02-14: qty 1

## 2014-02-14 MED ORDER — DOPAMINE-DEXTROSE 3.2-5 MG/ML-% IV SOLN
0.0000 ug/kg/min | INTRAVENOUS | Status: DC
  Filled 2014-02-14: qty 250

## 2014-02-14 MED ORDER — SODIUM CHLORIDE 0.9 % IV SOLN
INTRAVENOUS | Status: DC
  Filled 2014-02-14 (×2): qty 30

## 2014-02-14 MED ORDER — DEXTROSE 5 % IV SOLN
1.5000 g | INTRAVENOUS | Status: AC
  Administered 2014-02-15: 1.5 g via INTRAVENOUS
  Administered 2014-02-15: .75 g via INTRAVENOUS
  Filled 2014-02-14: qty 1.5

## 2014-02-14 MED ORDER — HEPARIN (PORCINE) IN NACL 100-0.45 UNIT/ML-% IJ SOLN
950.0000 [IU]/h | INTRAMUSCULAR | Status: DC
  Administered 2014-02-14 (×2): 950 [IU]/h via INTRAVENOUS
  Filled 2014-02-14 (×3): qty 250

## 2014-02-14 NOTE — Progress Notes (Signed)
ANTICOAGULATION CONSULT NOTE - Follow Up Consult  Pharmacy Consult for Heparin  Indication: Awaiting CABG  No Known Allergies  Patient Measurements: Height: 5\' 5"  (165.1 cm) Weight: 187 lb 9.8 oz (85.1 kg) IBW/kg (Calculated) : 57  Vital Signs: Temp: 98 F (36.7 C) (10/15 1100) Temp Source: Oral (10/15 1100) BP: 103/49 mmHg (10/15 1500) Pulse Rate: 56 (10/15 1400)  Labs:  Recent Labs  02/12/14 0251  02/12/14 1210 02/13/14 0222 02/14/14 0524 02/14/14 1430  HGB 12.4  --   --  13.2 13.1  --   HCT 37.2  --   --  39.2 38.5  --   PLT 212  --   --  237 212  --   HEPARINUNFRC  --   < > 0.26* 0.31 1.02* 0.55  CREATININE  --   --  0.94  --  0.91  --   TROPONINI  --   --  0.99*  --   --   --   < > = values in this interval not displayed.  Estimated Creatinine Clearance: 54 ml/min (by C-G formula based on Cr of 0.91).  Assessment: 80 yo female on IV heparin for CAD while awaiting CABG.  Heparin level was above goal this AM at 1.02.  Rate was reduced to 950 units/hr and now heparin level is within goal range.  No bleeding or complications noted.  Goal of Therapy:  Heparin level 0.3-0.7 units/ml Monitor platelets by anticoagulation protocol: Yes   Plan:  -Continue IV heparin at current rate. -Confirm heparin level at 2100 PM -Daily CBC/HL -Monitor for bleeding  Tad MooreJessica Steadman Prosperi, Pharm D, BCPS  Clinical Pharmacist Pager [phone]  02/14/2014 3:40 PM

## 2014-02-14 NOTE — Progress Notes (Signed)
3 Days Post-Op Procedure(s) (LRB): LEFT HEART CATHETERIZATION WITH CORONARY ANGIOGRAM (N/A) Subjective:  No chest pain. Had a good day  Objective: Vital signs in last 24 hours: Temp:  [98 F (36.7 C)-98.7 F (37.1 C)] 98.3 F (36.8 C) (10/15 1600) Pulse Rate:  [29-63] 59 (10/15 1800) Cardiac Rhythm:  [-] Sinus bradycardia (10/15 1600) Resp:  [16-20] 18 (10/15 1600) BP: (91-128)/(32-89) 96/32 mmHg (10/15 1800) SpO2:  [79 %-100 %] 97 % (10/15 1800)  Hemodynamic parameters for last 24 hours:    Intake/Output from previous day: 10/14 0701 - 10/15 0700 In: 904.8 [P.O.:560; I.V.:344.8] Out: 1050 [Urine:1050] Intake/Output this shift: Total I/O In: 1048.3 [P.O.:840; I.V.:208.3] Out: 550 [Urine:550]  General appearance: alert and cooperative Heart: regular rate and rhythm, S1, S2 normal, no murmur, click, rub or gallop Lungs: clear to auscultation bilaterally  Lab Results:  Recent Labs  02/13/14 0222 02/14/14 0524  WBC 6.4 6.3  HGB 13.2 13.1  HCT 39.2 38.5  PLT 237 212   BMET:  Recent Labs  02/12/14 1210 02/14/14 0524  NA 142 140  K 4.0 3.7  CL 106 104  CO2 23 25  GLUCOSE 99 96  BUN 16 20  CREATININE 0.94 0.91  CALCIUM 9.9 9.6    PT/INR: No results found for this basename: LABPROT, INR,  in the last 72 hours ABG No results found for this basename: phart, pco2, po2, hco3, tco2, acidbasedef, o2sat   CBG (last 3)  No results found for this basename: GLUCAP,  in the last 72 hours  Assessment/Plan: S/P Procedure(s) (LRB): LEFT HEART CATHETERIZATION WITH CORONARY ANGIOGRAM (N/A) Severe single vessel coronary disease Plan CABG in am. Patient and family have no further questions.   LOS: 4 days    Margarita Croke K 02/14/2014

## 2014-02-14 NOTE — Progress Notes (Signed)
ANTICOAGULATION CONSULT NOTE - Follow Up Consult  Pharmacy Consult for Heparin  Indication: Awaiting CABG  No Known Allergies  Patient Measurements: Height: 5\' 5"  (165.1 cm) Weight: 187 lb 9.8 oz (85.1 kg) IBW/kg (Calculated) : 57  Vital Signs: Temp: 98.6 F (37 C) (10/15 0300) Temp Source: Oral (10/15 0300) BP: 105/50 mmHg (10/15 0300) Pulse Rate: 50 (10/15 0300)  Labs:  Recent Labs  02/11/14 1154  02/12/14 0251 02/12/14 1210 02/13/14 0222 02/14/14 0524  HGB  --   < > 12.4  --  13.2 13.1  HCT  --   --  37.2  --  39.2 38.5  PLT  --   --  212  --  237 212  HEPARINUNFRC 0.68  --   --  0.26* 0.31 1.02*  CREATININE  --   --   --  0.94  --   --   TROPONINI 1.02*  --   --  0.99*  --   --   < > = values in this interval not displayed.  Estimated Creatinine Clearance: 52.2 ml/min (by C-G formula based on Cr of 0.94).  Assessment: SUPRA-therapeutic heparin level, drawn correctly from opposite arm, other labs as above, no other issues per RN.   Goal of Therapy:  Heparin level 0.3-0.7 units/ml Monitor platelets by anticoagulation protocol: Yes   Plan:  -Hold heparin x 1 hour -Restart heparin drip at 950 units/hr at 0715 -1500 HL -Daily CBC/HL -Monitor for bleeding  Abran DukeLedford, Dayanne Yiu 02/14/2014,6:14 AM

## 2014-02-14 NOTE — Progress Notes (Signed)
Patient Name: [name] [name] Date of Encounter: 02/14/2014     Principal Problem:   Unstable angina Active Problems:   HTN (hypertension)   HLD (hyperlipidemia)   Hypothyroidism    SUBJECTIVE  No chest pain. Complains of no BM since admission.  CURRENT MEDS . amLODipine  5 mg Oral Daily   And  . atorvastatin  40 mg Oral Daily  . aspirin EC  81 mg Oral Daily  . cholecalciferol  2,000 Units Oral Daily  . furosemide  40 mg Oral Daily  . levothyroxine  100 mcg Oral QAC breakfast  . metoprolol tartrate  12.5 mg Oral BID  . sodium chloride  3 mL Intravenous Q12H    OBJECTIVE  Filed Vitals:   02/14/14 0900 02/14/14 0928 02/14/14 1000 02/14/14 1100  BP: 109/64 109/64 128/54 111/69  Pulse: 58  53 29  Temp:      TempSrc:      Resp:      Height:      Weight:      SpO2: 99%  94% 79%    Intake/Output Summary (Last 24 hours) at 02/14/14 1146 Last data filed at 02/14/14 1100  Gross per 24 hour  Intake    632 ml  Output   1100 ml  Net   -468 ml   Filed Weights   02/10/14 2340 02/11/14 1354 02/11/14 1830  Weight: 191 lb 1.6 oz (86.682 kg) 188 lb 4.4 oz (85.4 kg) 187 lb 9.8 oz (85.1 kg)    PHYSICAL EXAM  General: Pleasant, NAD. Neuro: Alert and oriented X 3. Moves all extremities spontaneously. Psych: Normal affect. HEENT:  Normal  Neck: Supple without bruits or JVD. Lungs:  Resp regular and unlabored, CTA. Heart: RRR no s3, s4. Soft systolic ejection murmur at base. Abdomen: Soft, non-tender, non-distended, BS + x 4.  Extremities: No clubbing, cyanosis or edema. DP/PT/Radials 2+ and equal bilaterally.  Accessory Clinical Findings  CBC  Recent Labs  02/13/14 0222 02/14/14 0524  WBC 6.4 6.3  HGB 13.2 13.1  HCT 39.2 38.5  MCV 84.1 83.5  PLT 237 212   Basic Metabolic Panel  Recent Labs  02/11/14 2304 02/12/14 1210 02/14/14 0524  NA  --  142 140  K 3.4* 4.0 3.7  CL  --  106 104  CO2  --  23 25  GLUCOSE  --  99 96  BUN  --  16 20  CREATININE   --  0.94 0.91  CALCIUM  --  9.9 9.6  MG 1.8  --   --    Liver Function Tests No results found for this basename: AST, ALT, ALKPHOS, BILITOT, PROT, ALBUMIN,  in the last 72 hours No results found for this basename: LIPASE, AMYLASE,  in the last 72 hours Cardiac Enzymes  Recent Labs  02/11/14 1154 02/12/14 1210  TROPONINI 1.02* 0.99*   BNP No components found with this basename: POCBNP,  D-Dimer No results found for this basename: DDIMER,  in the last 72 hours Hemoglobin A1C No results found for this basename: HGBA1C,  in the last 72 hours Fasting Lipid Panel No results found for this basename: CHOL, HDL, LDLCALC, TRIG, CHOLHDL, LDLDIRECT,  in the last 72 hours Thyroid Function Tests No results found for this basename: TSH, T4TOTAL, FREET3, T3FREE, THYROIDAB,  in the last 72 hours  TELE  Sinus bradycardia.  ECG    Radiology/Studies  Dg Chest 2 View  02/10/2014   CLINICAL DATA:  Shortness of breath for 1  week. Chest pain today. History of hypertension. Initial encounter.  EXAM: CHEST  2 VIEW  COMPARISON:  None.  FINDINGS: Enlarged cardiac silhouette and mediastinal contours with tortuosity and ectasia of the thoracic aorta. Atherosclerotic plaque within the thoracic aorta. There is minimal linear heterogeneous subsegmental atelectasis / scar within the peripheral aspect of the left mid lung. No discrete focal airspace opacities. No pleural effusion or pneumothorax. No evidence of edema. No acute osseus abnormalities.  IMPRESSION: Cardiomegaly with tortuosity/ectasia of the thoracic aorta without acute cardiopulmonary disease.   Electronically Signed   By: Simonne ComeJohn  Watts M.D.   On: 02/10/2014 20:43    ASSESSMENT AND PLAN 1. NSTEMI. For CABG Friday. Appreciate Dr. Sharee PimpleBartle's consult.  2.Hypokalemia, resolved.  3. Hypertension. EF 50-55% with grade 1 diastolic dysfunction  4. HLD  5. Hypothyroidism 6. Constipation.  Continue current meds. BP still low normal so will not add ACEi  yet. Miralax for constipation. CABG tomorrow.     Signed, Cassell Clementhomas Andreina Outten MD

## 2014-02-14 NOTE — Progress Notes (Signed)
ANTICOAGULATION CONSULT NOTE - Follow Up Consult  Pharmacy Consult for Heparin Indication: awaiting CABG  No Known Allergies  Patient Measurements: Height: 5\' 5"  (165.1 cm) Weight: 187 lb 9.8 oz (85.1 kg) IBW/kg (Calculated) : 57 Heparin Dosing Weight: 75 kg  Labs:  Recent Labs  02/12/14 0251  02/12/14 1210 02/13/14 0222 02/14/14 0524 02/14/14 1430 02/14/14 2049  HGB 12.4  --   --  13.2 13.1  --   --   HCT 37.2  --   --  39.2 38.5  --   --   PLT 212  --   --  237 212  --   --   HEPARINUNFRC  --   < > 0.26* 0.31 1.02* 0.55 0.64  CREATININE  --   --  0.94  --  0.91  --   --   TROPONINI  --   --  0.99*  --   --   --   --   < > = values in this interval not displayed.  Estimated Creatinine Clearance: 54 ml/min (by C-G formula based on Cr of 0.91).  Assessment:   Heparin level remains therapeutic (0.64) on 950 units/hr.   For CABG in am.  Goal of Therapy:  Heparin level 0.3-0.7 units/ml Monitor platelets by anticoagulation protocol: Yes   Plan:   Continue heparin drip at 950 units/hr.  Heparin to be stopped on call to OR in am.  Dennie FettersEgan, Damarri Rampy Donovan, RPh Pager: [phone] 02/14/2014,9:57 PM

## 2014-02-15 ENCOUNTER — Encounter (HOSPITAL_COMMUNITY)
Admission: EM | Disposition: A | Discharge status: Home Health | Payer: Medicare PPO | Source: Home / Self Care | Attending: Surgery

## 2014-02-15 ENCOUNTER — Inpatient Hospital Stay (HOSPITAL_COMMUNITY): Payer: Medicare PPO | Admitting: Certified Registered"

## 2014-02-15 ENCOUNTER — Encounter (HOSPITAL_COMMUNITY): Payer: Self-pay | Admitting: Certified Registered"

## 2014-02-15 ENCOUNTER — Inpatient Hospital Stay (HOSPITAL_COMMUNITY): Payer: Medicare PPO

## 2014-02-15 DIAGNOSIS — Z951 Presence of aortocoronary bypass graft: Secondary | ICD-10-CM

## 2014-02-15 HISTORY — PX: TEE WITHOUT CARDIOVERSION: SHX5443

## 2014-02-15 HISTORY — PX: CORONARY ARTERY BYPASS GRAFT: SHX141

## 2014-02-15 LAB — POCT I-STAT, CHEM 8
BUN: 13 mg/dL (ref 6–23)
BUN: 12 mg/dL (ref 6–23)
BUN: 11 mg/dL (ref 6–23)
BUN: 10 mg/dL (ref 6–23)
BUN: 11 mg/dL (ref 6–23)
BUN: 8 mg/dL (ref 6–23)
CALCIUM ION: 1.09 mmol/L — AB (ref 1.13–1.30)
CHLORIDE: 105 meq/L (ref 96–112)
CHLORIDE: 100 meq/L (ref 96–112)
CHLORIDE: 103 meq/L (ref 96–112)
CHLORIDE: 104 meq/L (ref 96–112)
CHLORIDE: 108 meq/L (ref 96–112)
CREATININE: 0.5 mg/dL (ref 0.50–1.10)
CREATININE: 0.5 mg/dL (ref 0.50–1.10)
CREATININE: 0.6 mg/dL (ref 0.50–1.10)
Calcium, Ion: 1.25 mmol/L (ref 1.13–1.30)
Calcium, Ion: 1.28 mmol/L (ref 1.13–1.30)
Calcium, Ion: 1.1 mmol/L — ABNORMAL LOW (ref 1.13–1.30)
Calcium, Ion: 1.12 mmol/L — ABNORMAL LOW (ref 1.13–1.30)
Calcium, Ion: 1.22 mmol/L (ref 1.13–1.30)
Chloride: 106 mEq/L (ref 96–112)
Creatinine, Ser: 0.6 mg/dL (ref 0.50–1.10)
Creatinine, Ser: 0.6 mg/dL (ref 0.50–1.10)
Creatinine, Ser: 0.6 mg/dL (ref 0.50–1.10)
GLUCOSE: 109 mg/dL — AB (ref 70–99)
Glucose, Bld: 123 mg/dL — ABNORMAL HIGH (ref 70–99)
Glucose, Bld: 95 mg/dL (ref 70–99)
Glucose, Bld: 107 mg/dL — ABNORMAL HIGH (ref 70–99)
Glucose, Bld: 99 mg/dL (ref 70–99)
Glucose, Bld: 100 mg/dL — ABNORMAL HIGH (ref 70–99)
HCT: 33 % — ABNORMAL LOW (ref 36.0–46.0)
HCT: 26 % — ABNORMAL LOW (ref 36.0–46.0)
HCT: 25 % — ABNORMAL LOW (ref 36.0–46.0)
HCT: 31 % — ABNORMAL LOW (ref 36.0–46.0)
HEMATOCRIT: 32 % — AB (ref 36.0–46.0)
HEMATOCRIT: 25 % — AB (ref 36.0–46.0)
HEMOGLOBIN: 10.9 g/dL — AB (ref 12.0–15.0)
HEMOGLOBIN: 11.2 g/dL — AB (ref 12.0–15.0)
HEMOGLOBIN: 10.5 g/dL — AB (ref 12.0–15.0)
Hemoglobin: 8.8 g/dL — ABNORMAL LOW (ref 12.0–15.0)
Hemoglobin: 8.5 g/dL — ABNORMAL LOW (ref 12.0–15.0)
Hemoglobin: 8.5 g/dL — ABNORMAL LOW (ref 12.0–15.0)
POTASSIUM: 3.3 meq/L — AB (ref 3.7–5.3)
POTASSIUM: 3.4 meq/L — AB (ref 3.7–5.3)
POTASSIUM: 3.5 meq/L — AB (ref 3.7–5.3)
POTASSIUM: 4.6 meq/L (ref 3.7–5.3)
Potassium: 4.3 mEq/L (ref 3.7–5.3)
Potassium: 3.8 mEq/L (ref 3.7–5.3)
SODIUM: 139 meq/L (ref 137–147)
SODIUM: 141 meq/L (ref 137–147)
SODIUM: 139 meq/L (ref 137–147)
SODIUM: 142 meq/L (ref 137–147)
Sodium: 139 mEq/L (ref 137–147)
Sodium: 139 mEq/L (ref 137–147)
TCO2: 26 mmol/L (ref 0–100)
TCO2: 27 mmol/L (ref 0–100)
TCO2: 24 mmol/L (ref 0–100)
TCO2: 23 mmol/L (ref 0–100)
TCO2: 24 mmol/L (ref 0–100)
TCO2: 21 mmol/L (ref 0–100)

## 2014-02-15 LAB — POCT I-STAT 3, ART BLOOD GAS (G3+)
ACID-BASE DEFICIT: 1 mmol/L (ref 0.0–2.0)
Acid-base deficit: 1 mmol/L (ref 0.0–2.0)
Acid-base deficit: 2 mmol/L (ref 0.0–2.0)
Acid-base deficit: 4 mmol/L — ABNORMAL HIGH (ref 0.0–2.0)
BICARBONATE: 24.7 meq/L — AB (ref 20.0–24.0)
BICARBONATE: 23.2 meq/L (ref 20.0–24.0)
Bicarbonate: 23.6 mEq/L (ref 20.0–24.0)
Bicarbonate: 21.5 mEq/L (ref 20.0–24.0)
O2 SAT: 96 %
O2 SAT: 97 %
O2 Saturation: 100 %
O2 Saturation: 94 %
PCO2 ART: 43.5 mmHg (ref 35.0–45.0)
PCO2 ART: 39.7 mmHg (ref 35.0–45.0)
PH ART: 7.372 (ref 7.350–7.450)
PH ART: 7.337 — AB (ref 7.350–7.450)
PO2 ART: 294 mmHg — AB (ref 80.0–100.0)
Patient temperature: 37.5
Patient temperature: 37.8
TCO2: 26 mmol/L (ref 0–100)
TCO2: 25 mmol/L (ref 0–100)
TCO2: 24 mmol/L (ref 0–100)
TCO2: 23 mmol/L (ref 0–100)
pCO2 arterial: 42.6 mmHg (ref 35.0–45.0)
pCO2 arterial: 34.9 mmHg — ABNORMAL LOW (ref 35.0–45.0)
pH, Arterial: 7.429 (ref 7.350–7.450)
pH, Arterial: 7.344 — ABNORMAL LOW (ref 7.350–7.450)
pO2, Arterial: 68 mmHg — ABNORMAL LOW (ref 80.0–100.0)
pO2, Arterial: 96 mmHg (ref 80.0–100.0)
pO2, Arterial: 78 mmHg — ABNORMAL LOW (ref 80.0–100.0)

## 2014-02-15 LAB — CBC
HCT: 37.5 % (ref 36.0–46.0)
HEMATOCRIT: 30.4 % — AB (ref 36.0–46.0)
HEMATOCRIT: 30.8 % — AB (ref 36.0–46.0)
HEMOGLOBIN: 10.3 g/dL — AB (ref 12.0–15.0)
Hemoglobin: 12.5 g/dL (ref 12.0–15.0)
Hemoglobin: 10.4 g/dL — ABNORMAL LOW (ref 12.0–15.0)
MCH: 28.1 pg (ref 26.0–34.0)
MCH: 28.3 pg (ref 26.0–34.0)
MCH: 28.1 pg (ref 26.0–34.0)
MCHC: 33.3 g/dL (ref 30.0–36.0)
MCHC: 33.9 g/dL (ref 30.0–36.0)
MCHC: 33.8 g/dL (ref 30.0–36.0)
MCV: 84.3 fL (ref 78.0–100.0)
MCV: 83.5 fL (ref 78.0–100.0)
MCV: 83.2 fL (ref 78.0–100.0)
PLATELETS: 111 10*3/uL — AB (ref 150–400)
Platelets: 222 10*3/uL (ref 150–400)
Platelets: 126 10*3/uL — ABNORMAL LOW (ref 150–400)
RBC: 4.45 MIL/uL (ref 3.87–5.11)
RBC: 3.64 MIL/uL — AB (ref 3.87–5.11)
RBC: 3.7 MIL/uL — ABNORMAL LOW (ref 3.87–5.11)
RDW: 15.3 % (ref 11.5–15.5)
RDW: 15.2 % (ref 11.5–15.5)
RDW: 15.1 % (ref 11.5–15.5)
WBC: 6.6 10*3/uL (ref 4.0–10.5)
WBC: 8.5 10*3/uL (ref 4.0–10.5)
WBC: 10.9 10*3/uL — AB (ref 4.0–10.5)

## 2014-02-15 LAB — CREATININE, SERUM
CREATININE: 0.72 mg/dL (ref 0.50–1.10)
GFR calc Af Amer: >90 mL/min (ref >90)
GFR, EST NON AFRICAN AMERICAN: 80 mL/min — AB (ref >90)

## 2014-02-15 LAB — BASIC METABOLIC PANEL
Anion gap: 13 (ref 5–15)
BUN: 17 mg/dL (ref 6–23)
CHLORIDE: 103 meq/L (ref 96–112)
CO2: 24 meq/L (ref 19–32)
Calcium: 9.7 mg/dL (ref 8.4–10.5)
Creatinine, Ser: 0.96 mg/dL (ref 0.50–1.10)
GFR calc non Af Amer: 55 mL/min — ABNORMAL LOW (ref >90)
GFR, EST AFRICAN AMERICAN: 64 mL/min — AB (ref >90)
Glucose, Bld: 87 mg/dL (ref 70–99)
POTASSIUM: 3.8 meq/L (ref 3.7–5.3)
SODIUM: 140 meq/L (ref 137–147)

## 2014-02-15 LAB — POCT I-STAT 4, (NA,K, GLUC, HGB,HCT)
Glucose, Bld: 122 mg/dL — ABNORMAL HIGH (ref 70–99)
HCT: 30 % — ABNORMAL LOW (ref 36.0–46.0)
HEMOGLOBIN: 10.2 g/dL — AB (ref 12.0–15.0)
Potassium: 4 mEq/L (ref 3.7–5.3)
Sodium: 141 mEq/L (ref 137–147)

## 2014-02-15 LAB — PROTIME-INR
INR: 1.64 — ABNORMAL HIGH (ref 0.00–1.49)
Prothrombin Time: 19.5 seconds — ABNORMAL HIGH (ref 11.6–15.2)

## 2014-02-15 LAB — HEMOGLOBIN AND HEMATOCRIT, BLOOD
HCT: 24.9 % — ABNORMAL LOW (ref 36.0–46.0)
Hemoglobin: 8.5 g/dL — ABNORMAL LOW (ref 12.0–15.0)

## 2014-02-15 LAB — HEPARIN LEVEL (UNFRACTIONATED): HEPARIN UNFRACTIONATED: 0.69 [IU]/mL (ref 0.30–0.70)

## 2014-02-15 LAB — PLATELET COUNT: Platelets: 125 10*3/uL — ABNORMAL LOW (ref 150–400)

## 2014-02-15 LAB — MAGNESIUM: Magnesium: 3.2 mg/dL — ABNORMAL HIGH (ref 1.5–2.5)

## 2014-02-15 LAB — APTT: APTT: 40 s — AB (ref 24–37)

## 2014-02-15 SURGERY — CORONARY ARTERY BYPASS GRAFTING (CABG)
Anesthesia: General | Site: Chest

## 2014-02-15 MED ORDER — HEPARIN SODIUM (PORCINE) 1000 UNIT/ML IJ SOLN
INTRAMUSCULAR | Status: AC
  Filled 2014-02-15: qty 1

## 2014-02-15 MED ORDER — DEXTROSE 5 % IV SOLN
1.5000 g | Freq: Two times a day (BID) | INTRAVENOUS | Status: AC
  Administered 2014-02-15 – 2014-02-17 (×4): 1.5 g via INTRAVENOUS
  Filled 2014-02-15 (×4): qty 1.5

## 2014-02-15 MED ORDER — SODIUM CHLORIDE 0.9 % IV SOLN: INTRAVENOUS | Status: DC

## 2014-02-15 MED ORDER — THROMBIN 20000 UNITS EX SOLR
CUTANEOUS | Status: DC | PRN
  Administered 2014-02-15: 20000 [IU] via TOPICAL

## 2014-02-15 MED ORDER — MIDAZOLAM HCL 5 MG/5ML IJ SOLN
INTRAMUSCULAR | Status: DC | PRN
Start: 2014-02-15 — End: 2014-02-15
  Administered 2014-02-15 (×3): 2 mg via INTRAVENOUS

## 2014-02-15 MED ORDER — ROCURONIUM BROMIDE 50 MG/5ML IV SOLN
INTRAVENOUS | Status: AC
  Filled 2014-02-15: qty 2

## 2014-02-15 MED ORDER — THROMBIN 20000 UNITS EX SOLR
CUTANEOUS | Status: AC
  Filled 2014-02-15: qty 20000

## 2014-02-15 MED ORDER — ASPIRIN EC 325 MG PO TBEC
325.0000 mg | DELAYED_RELEASE_TABLET | Freq: Every day | ORAL | Status: DC
  Administered 2014-02-16 – 2014-02-19 (×4): 325 mg via ORAL
  Filled 2014-02-15 (×4): qty 1

## 2014-02-15 MED ORDER — PROTAMINE SULFATE 10 MG/ML IV SOLN
INTRAVENOUS | Status: AC
  Filled 2014-02-15: qty 5

## 2014-02-15 MED ORDER — PROPOFOL 10 MG/ML IV BOLUS
INTRAVENOUS | Status: AC
  Filled 2014-02-15: qty 20

## 2014-02-15 MED ORDER — ROCURONIUM BROMIDE 100 MG/10ML IV SOLN
INTRAVENOUS | Status: DC | PRN
Start: 2014-02-15 — End: 2014-02-15
  Administered 2014-02-15 (×3): 50 mg via INTRAVENOUS

## 2014-02-15 MED ORDER — PHENYLEPHRINE HCL 10 MG/ML IJ SOLN
0.0000 ug/min | INTRAVENOUS | Status: DC
  Filled 2014-02-15 (×2): qty 2

## 2014-02-15 MED ORDER — DEXMEDETOMIDINE HCL IN NACL 200 MCG/50ML IV SOLN: 0.0000 ug/kg/h | INTRAVENOUS | Status: DC

## 2014-02-15 MED ORDER — ONDANSETRON HCL 4 MG/2ML IJ SOLN: 4.0000 mg | Freq: Four times a day (QID) | INTRAMUSCULAR | Status: DC | PRN

## 2014-02-15 MED ORDER — SUFENTANIL CITRATE 250 MCG/5ML IV SOLN
INTRAVENOUS | Status: AC
  Filled 2014-02-15: qty 5

## 2014-02-15 MED ORDER — ALBUMIN HUMAN 5 % IV SOLN
250.0000 mL | INTRAVENOUS | Status: AC | PRN
  Administered 2014-02-15 (×3): 250 mL via INTRAVENOUS

## 2014-02-15 MED ORDER — ALBUMIN HUMAN 5 % IV SOLN
INTRAVENOUS | Status: DC | PRN
  Administered 2014-02-15: 11:00:00 via INTRAVENOUS

## 2014-02-15 MED ORDER — FAMOTIDINE IN NACL 20-0.9 MG/50ML-% IV SOLN
20.0000 mg | Freq: Two times a day (BID) | INTRAVENOUS | Status: DC
  Administered 2014-02-15: 20 mg via INTRAVENOUS

## 2014-02-15 MED ORDER — ACETAMINOPHEN 650 MG RE SUPP
650.0000 mg | Freq: Once | RECTAL | Status: AC
  Administered 2014-02-15: 650 mg via RECTAL

## 2014-02-15 MED ORDER — GLYCOPYRROLATE 0.2 MG/ML IJ SOLN
INTRAMUSCULAR | Status: DC | PRN
  Administered 2014-02-15: 0.2 mg via INTRAVENOUS

## 2014-02-15 MED ORDER — SODIUM CHLORIDE 0.9 % IV SOLN
INTRAVENOUS | Status: DC
  Filled 2014-02-15 (×2): qty 2.5

## 2014-02-15 MED ORDER — GLYCOPYRROLATE 0.2 MG/ML IJ SOLN
INTRAMUSCULAR | Status: AC
  Filled 2014-02-15: qty 1

## 2014-02-15 MED ORDER — PROPOFOL 10 MG/ML IV BOLUS
INTRAVENOUS | Status: DC | PRN
  Administered 2014-02-15: 90 mg via INTRAVENOUS

## 2014-02-15 MED ORDER — HEPARIN SODIUM (PORCINE) 1000 UNIT/ML IJ SOLN
INTRAMUSCULAR | Status: DC | PRN
  Administered 2014-02-15: 30000 [IU] via INTRAVENOUS

## 2014-02-15 MED ORDER — MAGNESIUM SULFATE 4000MG/100ML IJ SOLN
4.0000 g | Freq: Once | INTRAMUSCULAR | Status: AC
Start: 2014-02-15 — End: 2014-02-15
  Administered 2014-02-15: 4 g via INTRAVENOUS
  Filled 2014-02-15: qty 100

## 2014-02-15 MED ORDER — SODIUM CHLORIDE 0.9 % IJ SOLN
INTRAMUSCULAR | Status: AC
  Filled 2014-02-15: qty 10

## 2014-02-15 MED ORDER — MIDAZOLAM HCL 2 MG/2ML IJ SOLN: 2.0000 mg | INTRAMUSCULAR | Status: DC | PRN

## 2014-02-15 MED ORDER — MORPHINE SULFATE 2 MG/ML IJ SOLN
2.0000 mg | INTRAMUSCULAR | Status: DC | PRN
  Administered 2014-02-16: 2 mg via INTRAVENOUS
  Filled 2014-02-15 (×2): qty 1

## 2014-02-15 MED ORDER — 0.9 % SODIUM CHLORIDE (POUR BTL) OPTIME
TOPICAL | Status: DC | PRN
  Administered 2014-02-15: 6000 mL

## 2014-02-15 MED ORDER — TRAMADOL HCL 50 MG PO TABS
50.0000 mg | ORAL_TABLET | ORAL | Status: DC | PRN
  Administered 2014-02-16: 100 mg via ORAL
  Filled 2014-02-15: qty 2

## 2014-02-15 MED ORDER — PROTAMINE SULFATE 10 MG/ML IV SOLN
INTRAVENOUS | Status: DC | PRN
  Administered 2014-02-15: 250 mg via INTRAVENOUS

## 2014-02-15 MED ORDER — METOPROLOL TARTRATE 25 MG/10 ML ORAL SUSPENSION
12.5000 mg | Freq: Two times a day (BID) | ORAL | Status: DC
  Filled 2014-02-15 (×7): qty 5

## 2014-02-15 MED ORDER — LIDOCAINE HCL (CARDIAC) 20 MG/ML IV SOLN
INTRAVENOUS | Status: DC | PRN
  Administered 2014-02-15: 100 mg via INTRAVENOUS

## 2014-02-15 MED ORDER — VANCOMYCIN HCL IN DEXTROSE 1-5 GM/200ML-% IV SOLN
1000.0000 mg | Freq: Once | INTRAVENOUS | Status: AC
  Administered 2014-02-15: 1000 mg via INTRAVENOUS
  Filled 2014-02-15: qty 200

## 2014-02-15 MED ORDER — ACETAMINOPHEN 160 MG/5ML PO SOLN: 1000.0000 mg | Freq: Four times a day (QID) | ORAL | Status: DC

## 2014-02-15 MED ORDER — EPHEDRINE SULFATE 50 MG/ML IJ SOLN
INTRAMUSCULAR | Status: AC
  Filled 2014-02-15: qty 1

## 2014-02-15 MED ORDER — CHLORHEXIDINE GLUCONATE 0.12 % MT SOLN
OROMUCOSAL | Status: AC
  Administered 2014-02-15: 15 mL via OROMUCOSAL
  Filled 2014-02-15: qty 15

## 2014-02-15 MED ORDER — LACTATED RINGERS IV SOLN: INTRAVENOUS | Status: DC

## 2014-02-15 MED ORDER — SODIUM CHLORIDE 0.9 % IJ SOLN: 3.0000 mL | INTRAMUSCULAR | Status: DC | PRN

## 2014-02-15 MED ORDER — ONDANSETRON HCL 4 MG/2ML IJ SOLN
INTRAMUSCULAR | Status: AC
  Filled 2014-02-15: qty 2

## 2014-02-15 MED ORDER — MORPHINE SULFATE 2 MG/ML IJ SOLN
1.0000 mg | INTRAMUSCULAR | Status: AC | PRN
  Administered 2014-02-16: 2 mg via INTRAVENOUS
  Filled 2014-02-15: qty 1

## 2014-02-15 MED ORDER — MIDAZOLAM HCL 10 MG/2ML IJ SOLN
INTRAMUSCULAR | Status: AC
  Filled 2014-02-15: qty 2

## 2014-02-15 MED ORDER — METOPROLOL TARTRATE 12.5 MG HALF TABLET
12.5000 mg | ORAL_TABLET | Freq: Two times a day (BID) | ORAL | Status: DC
  Filled 2014-02-15 (×7): qty 1

## 2014-02-15 MED ORDER — ACETAMINOPHEN 500 MG PO TABS
1000.0000 mg | ORAL_TABLET | Freq: Four times a day (QID) | ORAL | Status: DC
  Administered 2014-02-16 – 2014-02-18 (×8): 1000 mg via ORAL
  Filled 2014-02-15 (×13): qty 2

## 2014-02-15 MED ORDER — EPHEDRINE SULFATE 50 MG/ML IJ SOLN
INTRAMUSCULAR | Status: DC | PRN
  Administered 2014-02-15: 5 mg via INTRAVENOUS

## 2014-02-15 MED ORDER — DOCUSATE SODIUM 100 MG PO CAPS
200.0000 mg | ORAL_CAPSULE | Freq: Every day | ORAL | Status: DC
Start: 2014-02-16 — End: 2014-02-19
  Administered 2014-02-16 – 2014-02-19 (×3): 200 mg via ORAL
  Filled 2014-02-15 (×3): qty 2

## 2014-02-15 MED ORDER — SUFENTANIL CITRATE 50 MCG/ML IV SOLN
INTRAVENOUS | Status: DC | PRN
  Administered 2014-02-15: 10 ug via INTRAVENOUS
  Administered 2014-02-15: 20 ug via INTRAVENOUS
  Administered 2014-02-15: 10 ug via INTRAVENOUS
  Administered 2014-02-15: 20 ug via INTRAVENOUS
  Administered 2014-02-15 (×2): 10 ug via INTRAVENOUS

## 2014-02-15 MED ORDER — THROMBIN 20000 UNITS EX SOLR
OROMUCOSAL | Status: DC | PRN
  Administered 2014-02-15 (×3): via TOPICAL

## 2014-02-15 MED ORDER — LIDOCAINE HCL (CARDIAC) 20 MG/ML IV SOLN
INTRAVENOUS | Status: AC
  Filled 2014-02-15: qty 5

## 2014-02-15 MED ORDER — METOPROLOL TARTRATE 1 MG/ML IV SOLN: 2.5000 mg | INTRAVENOUS | Status: DC | PRN

## 2014-02-15 MED ORDER — PANTOPRAZOLE SODIUM 40 MG PO TBEC
40.0000 mg | DELAYED_RELEASE_TABLET | Freq: Every day | ORAL | Status: DC
  Administered 2014-02-17 – 2014-02-19 (×3): 40 mg via ORAL
  Filled 2014-02-15 (×4): qty 1

## 2014-02-15 MED ORDER — OXYCODONE HCL 5 MG PO TABS: 5.0000 mg | ORAL_TABLET | ORAL | Status: DC | PRN

## 2014-02-15 MED ORDER — BISACODYL 10 MG RE SUPP: 10.0000 mg | Freq: Every day | RECTAL | Status: DC

## 2014-02-15 MED ORDER — POTASSIUM CHLORIDE 10 MEQ/50ML IV SOLN: 10.0000 meq | INTRAVENOUS | Status: AC

## 2014-02-15 MED ORDER — CHLORHEXIDINE GLUCONATE 0.12 % MT SOLN
15.0000 mL | Freq: Two times a day (BID) | OROMUCOSAL | Status: DC
  Administered 2014-02-15: 15 mL via OROMUCOSAL

## 2014-02-15 MED ORDER — INSULIN REGULAR BOLUS VIA INFUSION
0.0000 [IU] | Freq: Three times a day (TID) | INTRAVENOUS | Status: DC
  Filled 2014-02-15: qty 10

## 2014-02-15 MED ORDER — ACETAMINOPHEN 160 MG/5ML PO SOLN: 650.0000 mg | Freq: Once | ORAL | Status: AC

## 2014-02-15 MED ORDER — LACTATED RINGERS IV SOLN
INTRAVENOUS | Status: DC | PRN
  Administered 2014-02-15 (×2): via INTRAVENOUS

## 2014-02-15 MED ORDER — NITROGLYCERIN IN D5W 200-5 MCG/ML-% IV SOLN: 0.0000 ug/min | INTRAVENOUS | Status: DC

## 2014-02-15 MED ORDER — HEMOSTATIC AGENTS (NO CHARGE) OPTIME
TOPICAL | Status: DC | PRN
  Administered 2014-02-15: 1 via TOPICAL

## 2014-02-15 MED ORDER — ASPIRIN 81 MG PO CHEW: 324.0000 mg | CHEWABLE_TABLET | Freq: Every day | ORAL | Status: DC

## 2014-02-15 MED ORDER — LACTATED RINGERS IV SOLN: 500.0000 mL | Freq: Once | INTRAVENOUS | Status: AC | PRN

## 2014-02-15 MED ORDER — BISACODYL 5 MG PO TBEC
10.0000 mg | DELAYED_RELEASE_TABLET | Freq: Every day | ORAL | Status: DC
  Administered 2014-02-16 – 2014-02-17 (×2): 10 mg via ORAL
  Filled 2014-02-15 (×2): qty 2

## 2014-02-15 MED ORDER — SODIUM CHLORIDE 0.9 % IV SOLN: 250.0000 mL | INTRAVENOUS | Status: DC

## 2014-02-15 MED ORDER — SODIUM CHLORIDE 0.45 % IV SOLN
INTRAVENOUS | Status: DC
  Administered 2014-02-15 – 2014-02-18 (×3): via INTRAVENOUS

## 2014-02-15 MED ORDER — CETYLPYRIDINIUM CHLORIDE 0.05 % MT LIQD
7.0000 mL | Freq: Four times a day (QID) | OROMUCOSAL | Status: DC
  Administered 2014-02-15: 7 mL via OROMUCOSAL

## 2014-02-15 MED ORDER — SODIUM CHLORIDE 0.9 % IJ SOLN
3.0000 mL | Freq: Two times a day (BID) | INTRAMUSCULAR | Status: DC
  Administered 2014-02-16 – 2014-02-19 (×7): 3 mL via INTRAVENOUS

## 2014-02-15 MED FILL — Sodium Chloride IV Soln 0.9%: INTRAVENOUS | Qty: 2000 | Status: AC

## 2014-02-15 MED FILL — Heparin Sodium (Porcine) Inj 1000 Unit/ML: INTRAMUSCULAR | Qty: 10 | Status: AC

## 2014-02-15 MED FILL — Lidocaine HCl IV Inj 20 MG/ML: INTRAVENOUS | Qty: 5 | Status: AC

## 2014-02-15 MED FILL — Sodium Bicarbonate IV Soln 8.4%: INTRAVENOUS | Qty: 50 | Status: AC

## 2014-02-15 MED FILL — Electrolyte-R (PH 7.4) Solution: INTRAVENOUS | Qty: 4000 | Status: AC

## 2014-02-15 MED FILL — Mannitol IV Soln 20%: INTRAVENOUS | Qty: 500 | Status: AC

## 2014-02-15 SURGICAL SUPPLY — 101 items
ATTRACTOMAT 16X20 MAGNETIC DRP (DRAPES) ×4 IMPLANT
BAG DECANTER FOR FLEXI CONT (MISCELLANEOUS) ×4 IMPLANT
BANDAGE ELASTIC 4 VELCRO ST LF (GAUZE/BANDAGES/DRESSINGS) ×4 IMPLANT
BANDAGE ELASTIC 6 VELCRO ST LF (GAUZE/BANDAGES/DRESSINGS) ×4 IMPLANT
BASKET HEART  (ORDER IN 25'S) (MISCELLANEOUS) ×1
BASKET HEART (ORDER IN 25'S) (MISCELLANEOUS) ×1
BASKET HEART (ORDER IN 25S) (MISCELLANEOUS) ×2 IMPLANT
BLADE STERNUM SYSTEM 6 (BLADE) ×4 IMPLANT
BNDG GAUZE ELAST 4 BULKY (GAUZE/BANDAGES/DRESSINGS) ×4 IMPLANT
CANISTER SUCTION 2500CC (MISCELLANEOUS) ×4 IMPLANT
CARDIAC SUCTION (MISCELLANEOUS) ×4 IMPLANT
CATH ROBINSON RED A/P 18FR (CATHETERS) ×8 IMPLANT
CATH THORACIC 28FR (CATHETERS) ×4 IMPLANT
CATH THORACIC 36FR (CATHETERS) ×4 IMPLANT
CATH THORACIC 36FR RT ANG (CATHETERS) ×4 IMPLANT
CLIP TI MEDIUM 24 (CLIP) IMPLANT
CLIP TI WIDE RED SMALL 24 (CLIP) ×4 IMPLANT
COVER SURGICAL LIGHT HANDLE (MISCELLANEOUS) ×4 IMPLANT
CRADLE DONUT ADULT HEAD (MISCELLANEOUS) ×4 IMPLANT
DRAPE CARDIOVASCULAR INCISE (DRAPES) ×2
DRAPE SLUSH/WARMER DISC (DRAPES) ×4 IMPLANT
DRAPE SRG 135X102X78XABS (DRAPES) ×2 IMPLANT
DRSG COVADERM 4X14 (GAUZE/BANDAGES/DRESSINGS) ×4 IMPLANT
ELECT CAUTERY BLADE 6.4 (BLADE) ×4 IMPLANT
ELECT REM PT RETURN 9FT ADLT (ELECTROSURGICAL) ×8
ELECTRODE REM PT RTRN 9FT ADLT (ELECTROSURGICAL) ×4 IMPLANT
GAUZE SPONGE 4X4 12PLY STRL (GAUZE/BANDAGES/DRESSINGS) ×8 IMPLANT
GLOVE BIO SURGEON STRL SZ 6 (GLOVE) ×12 IMPLANT
GLOVE BIO SURGEON STRL SZ 6.5 (GLOVE) ×18 IMPLANT
GLOVE BIO SURGEON STRL SZ7 (GLOVE) IMPLANT
GLOVE BIO SURGEON STRL SZ7.5 (GLOVE) ×8 IMPLANT
GLOVE BIO SURGEONS STRL SZ 6.5 (GLOVE) ×6
GLOVE BIOGEL PI IND STRL 6 (GLOVE) IMPLANT
GLOVE BIOGEL PI IND STRL 6.5 (GLOVE) IMPLANT
GLOVE BIOGEL PI IND STRL 7.0 (GLOVE) IMPLANT
GLOVE BIOGEL PI INDICATOR 6 (GLOVE)
GLOVE BIOGEL PI INDICATOR 6.5 (GLOVE)
GLOVE BIOGEL PI INDICATOR 7.0 (GLOVE)
GLOVE EUDERMIC 7 POWDERFREE (GLOVE) ×8 IMPLANT
GLOVE ORTHO TXT STRL SZ7.5 (GLOVE) IMPLANT
GOWN STRL REUS W/ TWL LRG LVL3 (GOWN DISPOSABLE) ×12 IMPLANT
GOWN STRL REUS W/ TWL XL LVL3 (GOWN DISPOSABLE) ×2 IMPLANT
GOWN STRL REUS W/TWL LRG LVL3 (GOWN DISPOSABLE) ×12
GOWN STRL REUS W/TWL XL LVL3 (GOWN DISPOSABLE) ×2
HEMOSTAT POWDER SURGIFOAM 1G (HEMOSTASIS) ×12 IMPLANT
HEMOSTAT SURGICEL 2X14 (HEMOSTASIS) ×4 IMPLANT
INSERT FOGARTY 61MM (MISCELLANEOUS) IMPLANT
INSERT FOGARTY XLG (MISCELLANEOUS) IMPLANT
KIT BASIN OR (CUSTOM PROCEDURE TRAY) ×4 IMPLANT
KIT CATH CPB BARTLE (MISCELLANEOUS) ×4 IMPLANT
KIT ROOM TURNOVER OR (KITS) ×4 IMPLANT
KIT SUCTION CATH 14FR (SUCTIONS) ×4 IMPLANT
KIT VASOVIEW W/TROCAR VH 2000 (KITS) ×4 IMPLANT
NS IRRIG 1000ML POUR BTL (IV SOLUTION) ×20 IMPLANT
PACK OPEN HEART (CUSTOM PROCEDURE TRAY) ×4 IMPLANT
PAD ARMBOARD 7.5X6 YLW CONV (MISCELLANEOUS) ×8 IMPLANT
PAD ELECT DEFIB RADIOL ZOLL (MISCELLANEOUS) ×4 IMPLANT
PENCIL BUTTON HOLSTER BLD 10FT (ELECTRODE) ×4 IMPLANT
PUNCH AORTIC ROTATE 4.0MM (MISCELLANEOUS) IMPLANT
PUNCH AORTIC ROTATE 4.5MM 8IN (MISCELLANEOUS) ×4 IMPLANT
PUNCH AORTIC ROTATE 5MM 8IN (MISCELLANEOUS) IMPLANT
SET CARDIOPLEGIA MPS 5001102 (MISCELLANEOUS) ×4 IMPLANT
SPONGE GAUZE 4X4 12PLY STER LF (GAUZE/BANDAGES/DRESSINGS) ×8 IMPLANT
SPONGE INTESTINAL PEANUT (DISPOSABLE) IMPLANT
SPONGE LAP 18X18 X RAY DECT (DISPOSABLE) IMPLANT
SPONGE LAP 4X18 X RAY DECT (DISPOSABLE) ×4 IMPLANT
SUT BONE WAX W31G (SUTURE) ×4 IMPLANT
SUT MNCRL AB 4-0 PS2 18 (SUTURE) IMPLANT
SUT PROLENE 3 0 SH DA (SUTURE) IMPLANT
SUT PROLENE 3 0 SH1 36 (SUTURE) ×4 IMPLANT
SUT PROLENE 4 0 RB 1 (SUTURE)
SUT PROLENE 4 0 SH DA (SUTURE) IMPLANT
SUT PROLENE 4-0 RB1 .5 CRCL 36 (SUTURE) IMPLANT
SUT PROLENE 5 0 C 1 36 (SUTURE) IMPLANT
SUT PROLENE 6 0 C 1 30 (SUTURE) IMPLANT
SUT PROLENE 7 0 BV 1 (SUTURE) IMPLANT
SUT PROLENE 7 0 BV1 MDA (SUTURE) ×8 IMPLANT
SUT PROLENE 8 0 BV175 6 (SUTURE) IMPLANT
SUT SILK  1 MH (SUTURE)
SUT SILK 1 MH (SUTURE) IMPLANT
SUT STEEL STERNAL CCS#1 18IN (SUTURE) IMPLANT
SUT STEEL SZ 6 DBL 3X14 BALL (SUTURE) ×12 IMPLANT
SUT VIC AB 1 CTX 36 (SUTURE) ×6
SUT VIC AB 1 CTX36XBRD ANBCTR (SUTURE) ×6 IMPLANT
SUT VIC AB 2-0 CT1 27 (SUTURE) ×2
SUT VIC AB 2-0 CT1 TAPERPNT 27 (SUTURE) ×2 IMPLANT
SUT VIC AB 2-0 CTX 27 (SUTURE) IMPLANT
SUT VIC AB 3-0 SH 27 (SUTURE)
SUT VIC AB 3-0 SH 27X BRD (SUTURE) IMPLANT
SUT VIC AB 3-0 X1 27 (SUTURE) ×4 IMPLANT
SUT VICRYL 4-0 PS2 18IN ABS (SUTURE) IMPLANT
SUTURE E-PAK OPEN HEART (SUTURE) ×4 IMPLANT
SYSTEM SAHARA CHEST DRAIN ATS (WOUND CARE) ×4 IMPLANT
TAPE CLOTH SURG 4X10 WHT LF (GAUZE/BANDAGES/DRESSINGS) ×4 IMPLANT
TAPE PAPER 2X10 WHT MICROPORE (GAUZE/BANDAGES/DRESSINGS) ×4 IMPLANT
TOWEL OR 17X24 6PK STRL BLUE (TOWEL DISPOSABLE) ×4 IMPLANT
TOWEL OR 17X26 10 PK STRL BLUE (TOWEL DISPOSABLE) ×4 IMPLANT
TRAY FOLEY IC TEMP SENS 16FR (CATHETERS) ×4 IMPLANT
TUBING INSUFFLATION (TUBING) ×4 IMPLANT
UNDERPAD 30X30 INCONTINENT (UNDERPADS AND DIAPERS) ×4 IMPLANT
WATER STERILE IRR 1000ML POUR (IV SOLUTION) ×8 IMPLANT

## 2014-02-15 NOTE — Brief Op Note (Signed)
      [address]       [place] [id number]             [phone]     02/10/2014 - 02/15/2014  10:42 AM  PATIENT:  [name] ConnersEtrulia [name]  80 y.o. female  PRE-OPERATIVE DIAGNOSIS:  CAD  POST-OPERATIVE DIAGNOSIS:  CAD  PROCEDURE:  Procedure(s): CORONARY ARTERY BYPASS GRAFTING (CABG) X3 LIMA-LAD; SVG-DIAG 1; SVG-DIAG 2 TRANSESOPHAGEAL ECHOCARDIOGRAM (TEE) EVH RIGHT THIGH  SURGEON:  Surgeon(s): Alleen BorneBryan K Bartle, MD  PHYSICIAN ASSISTANT: [name] PA-C  ANESTHESIA:   general  PATIENT CONDITION:  ICU - intubated and hemodynamically stable.  PRE-OPERATIVE WEIGHT: 85kg  EBL: SEE ANEST/PERFUSION RECORDS  COMPLICATIONS: NO KNOWN

## 2014-02-15 NOTE — Progress Notes (Signed)
Follows commands, sticks tongue out, moves all 4 extremities equally and holds head up off pillow. Meets weaning criteria and will proceed with rapid weaning protocol.

## 2014-02-15 NOTE — Progress Notes (Signed)
*  PRELIMINARY RESULTS* Echocardiogram Echocardiogram Transesophageal has been performed.  Jeryl ColumbiaLLIOTT, Tishina Lown 02/15/2014, 9:47 AM

## 2014-02-15 NOTE — OR Nursing (Signed)
11:50 - 2nd call to SICU charge nurse.

## 2014-02-15 NOTE — Progress Notes (Signed)
Unable to obtain EKG after surgery due to profound bradycardia HR <30.

## 2014-02-15 NOTE — Anesthesia Preprocedure Evaluation (Signed)
Anesthesia Evaluation  Patient identified by MRN, date of birth, ID band Patient awake    Reviewed: Allergy & Precautions, H&P , NPO status , Patient's Chart, lab work & pertinent test results  Airway Mallampati: I      Dental   Pulmonary neg pulmonary ROS,  breath sounds clear to auscultation        Cardiovascular hypertension, + angina Rhythm:Regular Rate:Normal     Neuro/Psych    GI/Hepatic negative GI ROS, Neg liver ROS,   Endo/Other  diabetesHypothyroidism   Renal/GU negative Renal ROS     Musculoskeletal   Abdominal   Peds  Hematology   Anesthesia Other Findings   Reproductive/Obstetrics                           Anesthesia Physical Anesthesia Plan  ASA: III  Anesthesia Plan: General   Post-op Pain Management:    Induction: Intravenous  Airway Management Planned: Oral ETT  Additional Equipment: Arterial line, PA Cath and TEE  Intra-op Plan:   Post-operative Plan: Post-operative intubation/ventilation  Informed Consent: I have reviewed the patients History and Physical, chart, labs and discussed the procedure including the risks, benefits and alternatives for the proposed anesthesia with the patient or authorized representative who has indicated his/her understanding and acceptance.   Dental advisory given  Plan Discussed with: CRNA and Anesthesiologist  Anesthesia Plan Comments:         Anesthesia Quick Evaluation

## 2014-02-15 NOTE — Procedures (Signed)
Extubation Procedure Note  Patient Details:   Name: [name] [name] DOB: [birth year] MRN: [id number]   Airway Documentation:  AIRWAYS 8 mm (Active)  Secured at (cm) 21 cm 02/15/2014 12:00 AM    Evaluation  O2 sats: stable throughout Complications: No apparent complications Patient did tolerate procedure well. Bilateral Breath Sounds: Clear   Yes  Pt tolerated rapid wean, VC 650mL, NIF -30, positive for cuff leak, extubated to 4L [place]. No dyspnea or stridor noted after extubation.   [name] [name], Clova Morlock C 02/15/2014, 11:37 PM

## 2014-02-15 NOTE — Transfer of Care (Signed)
Immediate Anesthesia Transfer of Care Note  Patient: [name] [name]  Procedure(s) Performed: Procedure(s): CORONARY ARTERY BYPASS GRAFTING (CABG) x three,  using left internal mammary artery and right leg greater saphenous vein harvested endoscopically (N/A) TRANSESOPHAGEAL ECHOCARDIOGRAM (TEE) (N/A)  Patient Location: ICU  Anesthesia Type:General  Level of Consciousness: sedated, unresponsive and Patient remains intubated per anesthesia plan  Airway & Oxygen Therapy: Patient remains intubated per anesthesia plan and Patient placed on Ventilator (see vital sign flow sheet for setting)  Post-op Assessment: Post -op Vital signs reviewed and stable and Report to ICU RN  Post vital signs: Reviewed and stable  Complications: No apparent anesthesia complications

## 2014-02-15 NOTE — OR Nursing (Signed)
11:20am - 1st call to SICU charge nurse

## 2014-02-15 NOTE — Anesthesia Procedure Notes (Signed)
Procedures

## 2014-02-15 NOTE — Op Note (Signed)
CARDIOVASCULAR SURGERY OPERATIVE NOTE  02/15/2014  Surgeon:  Alleen BorneBryan K. Bartle, MD  First Assistant: Gershon CraneWayne Gold,  PA-C   Preoperative Diagnosis:  Severe single vessel coronary artery disease   Postoperative Diagnosis:  Same   Procedure:  1. Median Sternotomy 2. Extracorporeal circulation 3.   Coronary artery bypass grafting x 3   Left internal mammary graft to the LAD  SVG to diagonal 1  SVG to diagonal 2  4.   Endoscopic vein harvest from the right leg   Anesthesia:  General Endotracheal   Clinical History/Surgical Indication:  She has no prior heart disease history but notes a several month history of tiredness and shortness of breath, fatigue with exertion. She has been walking on a treadmill at home but over the past few weeks she has had to stop after a couple minutes due to a tired sensation in her chest and left arm. On Sunday she finished preparing a meal and afterwards developed sudden severe chest pain at rest. ECG showed nonspecific changes and Troponin was 0.52. The peak was 1.02. Cath yesterday showed the LAD to be heavily calcified with a 95% stenosis in the proximal vessel at the takeoff of the first septal. The mid LAD was occluded at the takeoff of the first and second diagonals with faint filling of the distal LAD by right to left collaterals. EF was 35% with severe anterior hypo and apical akinesis. LVEDP was 29. She had 4/10 CP at the end of the procedure that resolved with NTG. She has had none since on heparin and NTG drips. Severe single vessel coronary disease involving the LAD, s/p NSTEMI. I suspect that the LAD occluded at some time in the past and now the tight proximal LAD lesion is causing problems by compromising the septal and diagonals. I discussed the operative procedure of coronary artery bypass graft surgery with the patient and her daughter and sister  including alternatives, benefits and risks; including but not limited to bleeding, blood transfusion, infection, stroke, myocardial infarction, graft failure, heart block requiring a permanent pacemaker, organ dysfunction, and death. [name] [name] understands and agrees to proceed.    Preparation:  The patient was seen in the preoperative holding area and the correct patient, correct operation were confirmed with the patient after reviewing the medical record and catheterization. The consent was signed by me. Preoperative antibiotics were given. A pulmonary arterial line and radial arterial line were placed by the anesthesia team. The patient was taken back to the operating room and positioned supine on the operating room table. After being placed under general endotracheal anesthesia by the anesthesia team a foley catheter was placed. The neck, chest, abdomen, and both legs were prepped with betadine soap and solution and draped in the usual sterile manner. A surgical time-out was taken and the correct patient and operative procedure were confirmed with the nursing and anesthesia staff.   Cardiopulmonary Bypass:  A median sternotomy was performed. The pericardium was opened in the midline. Right ventricular function appeared normal. The ascending aorta was of normal size and had no palpable plaque. There were no contraindications to aortic cannulation or cross-clamping. The patient was fully systemically heparinized and the ACT was maintained > 400 sec. The proximal aortic arch was cannulated with a 20 F aortic cannula for arterial inflow. Venous cannulation was performed via the right atrial appendage using a two-staged venous cannula. An antegrade cardioplegia/vent cannula was inserted into the mid-ascending aorta. Aortic occlusion was performed with a single cross-clamp. Systemic cooling  to 32 degrees Centigrade and topical cooling of the heart with iced saline were used. Hyperkalemic antegrade cold  blood cardioplegia was used to induce diastolic arrest and was then given at about 20 minute intervals throughout the period of arrest to maintain myocardial temperature at or below 10 degrees centigrade. A temperature probe was inserted into the interventricular septum and an insulating pad was placed in the pericardium.   Left internal mammary harvest:  The left side of the sternum was retracted using the Rultract retractor. The left internal mammary artery was harvested as a pedicle graft. All side branches were clipped. It was a medium-sized vessel of good quality with excellent blood flow. It was ligated distally and divided. It was sprayed with topical papaverine solution to prevent vasospasm.   Endoscopic vein harvest:  The right greater saphenous vein was harvested endoscopically through a 2 cm incision medial to the right knee. It was harvested from the upper thigh to below the knee. It was a medium-sized vein of good quality. The side branches were all ligated with 4-0 silk ties.    Coronary arteries:  The coronary arteries were examined.   LAD:  Diffusely diseased with calcific plaque. There was one short area in the mid to distal vessel that was soft enough to open. A 1.5 mm probe passed up the LAD for about 2 cm and a 1.0 mm probe would not pass distally to the apex due to disease within the lumen that I could feel with the probe.  LCX:  No visible disease  RCA:  Segmental plaque throughout.   Grafts:  1. LIMA to the LAD: 1.5 mm. It was sewn end to side using 8-0 prolene continuous suture. 2. SVG to diagonal 1:  1.6 mm. It was sewn end to side using 7-0 prolene continuous suture. 3. SVG to diagonal 2:  1.5 mm. It was sewn end to side using 7-0 prolene continuous suture.   The proximal vein graft anastomoses were performed to the mid-ascending aorta using continuous 6-0 prolene suture. Graft markers were placed around the proximal anastomoses.   Completion:  The  patient was rewarmed to 37 degrees Centigrade. The clamp was removed from the LIMA pedicle and there was rapid warming of the septum and return of ventricular fibrillation. The crossclamp was removed with a time of 62 minutes. There was spontaneous return of sinus rhythm. The distal and proximal anastomoses were checked for hemostasis. The position of the grafts was satisfactory. Two temporary epicardial pacing wires were placed on the right atrium and two on the right ventricle. The patient was weaned from CPB without difficulty on no inotropes. CPB time was 83 minutes. Cardiac output was 5 LPM. TEE showed normal LV function. Heparin was fully reversed with protamine and the aortic and venous cannulas removed. Hemostasis was achieved. Mediastinal and left pleural drainage tubes were placed. The sternum was closed with double #6 stainless steel wires. The fascia was closed with continuous # 1 vicryl suture. The subcutaneous tissue was closed with 2-0 vicryl continuous suture. The skin was closed with 3-0 vicryl subcuticular suture. All sponge, needle, and instrument counts were reported correct at the end of the case. Dry sterile dressings were placed over the incisions and around the chest tubes which were connected to pleurevac suction. The patient was then transported to the surgical intensive care unit in critical but stable condition.

## 2014-02-16 ENCOUNTER — Inpatient Hospital Stay (HOSPITAL_COMMUNITY): Payer: Medicare PPO

## 2014-02-16 LAB — CBC
HCT: 29 % — ABNORMAL LOW (ref 36.0–46.0)
HCT: 30.5 % — ABNORMAL LOW (ref 36.0–46.0)
Hemoglobin: 9.7 g/dL — ABNORMAL LOW (ref 12.0–15.0)
Hemoglobin: 10.2 g/dL — ABNORMAL LOW (ref 12.0–15.0)
MCH: 28.2 pg (ref 26.0–34.0)
MCH: 28.3 pg (ref 26.0–34.0)
MCHC: 33.4 g/dL (ref 30.0–36.0)
MCHC: 33.4 g/dL (ref 30.0–36.0)
MCV: 84.3 fL (ref 78.0–100.0)
MCV: 84.7 fL (ref 78.0–100.0)
PLATELETS: 131 10*3/uL — AB (ref 150–400)
Platelets: 125 10*3/uL — ABNORMAL LOW (ref 150–400)
RBC: 3.44 MIL/uL — ABNORMAL LOW (ref 3.87–5.11)
RBC: 3.6 MIL/uL — AB (ref 3.87–5.11)
RDW: 15.5 % (ref 11.5–15.5)
RDW: 15.6 % — ABNORMAL HIGH (ref 11.5–15.5)
WBC: 11.3 10*3/uL — ABNORMAL HIGH (ref 4.0–10.5)
WBC: 11.9 10*3/uL — AB (ref 4.0–10.5)

## 2014-02-16 LAB — GLUCOSE, CAPILLARY
GLUCOSE-CAPILLARY: 119 mg/dL — AB (ref 70–99)
Glucose-Capillary: 120 mg/dL — ABNORMAL HIGH (ref 70–99)
Glucose-Capillary: 121 mg/dL — ABNORMAL HIGH (ref 70–99)
Glucose-Capillary: 128 mg/dL — ABNORMAL HIGH (ref 70–99)
Glucose-Capillary: 135 mg/dL — ABNORMAL HIGH (ref 70–99)
Glucose-Capillary: 149 mg/dL — ABNORMAL HIGH (ref 70–99)

## 2014-02-16 LAB — BASIC METABOLIC PANEL
Anion gap: 12 (ref 5–15)
BUN: 9 mg/dL (ref 6–23)
CHLORIDE: 107 meq/L (ref 96–112)
CO2: 21 mEq/L (ref 19–32)
CREATININE: 0.72 mg/dL (ref 0.50–1.10)
Calcium: 8.6 mg/dL (ref 8.4–10.5)
GFR calc Af Amer: >90 mL/min (ref >90)
GFR calc non Af Amer: 80 mL/min — ABNORMAL LOW (ref >90)
GLUCOSE: 132 mg/dL — AB (ref 70–99)
POTASSIUM: 4 meq/L (ref 3.7–5.3)
Sodium: 140 mEq/L (ref 137–147)

## 2014-02-16 LAB — POCT I-STAT, CHEM 8
BUN: 10 mg/dL (ref 6–23)
Calcium, Ion: 1.31 mmol/L — ABNORMAL HIGH (ref 1.13–1.30)
Chloride: 104 mEq/L (ref 96–112)
Creatinine, Ser: 0.8 mg/dL (ref 0.50–1.10)
Glucose, Bld: 133 mg/dL — ABNORMAL HIGH (ref 70–99)
HCT: 32 % — ABNORMAL LOW (ref 36.0–46.0)
HEMOGLOBIN: 10.9 g/dL — AB (ref 12.0–15.0)
Potassium: 3.9 mEq/L (ref 3.7–5.3)
SODIUM: 138 meq/L (ref 137–147)
TCO2: 21 mmol/L (ref 0–100)

## 2014-02-16 LAB — CREATININE, SERUM
Creatinine, Ser: 0.81 mg/dL (ref 0.50–1.10)
GFR calc non Af Amer: 67 mL/min — ABNORMAL LOW (ref >90)
GFR, EST AFRICAN AMERICAN: 78 mL/min — AB (ref >90)

## 2014-02-16 LAB — MAGNESIUM
Magnesium: 2.4 mg/dL (ref 1.5–2.5)
Magnesium: 2.3 mg/dL (ref 1.5–2.5)

## 2014-02-16 MED ORDER — OXYCODONE HCL 5 MG PO TABS: 5.0000 mg | ORAL_TABLET | ORAL | Status: DC | PRN

## 2014-02-16 MED ORDER — INSULIN ASPART 100 UNIT/ML ~~LOC~~ SOLN
0.0000 [IU] | SUBCUTANEOUS | Status: DC
  Administered 2014-02-16 – 2014-02-17 (×3): 2 [IU] via SUBCUTANEOUS

## 2014-02-16 MED ORDER — FUROSEMIDE 10 MG/ML IJ SOLN
20.0000 mg | Freq: Two times a day (BID) | INTRAMUSCULAR | Status: DC
  Administered 2014-02-16 – 2014-02-17 (×3): 20 mg via INTRAVENOUS
  Filled 2014-02-16 (×5): qty 2

## 2014-02-16 MED ORDER — LEVOTHYROXINE SODIUM 100 MCG PO TABS
100.0000 ug | ORAL_TABLET | Freq: Every day | ORAL | Status: DC
  Administered 2014-02-16 – 2014-02-21 (×6): 100 ug via ORAL
  Filled 2014-02-16 (×7): qty 1

## 2014-02-16 MED ORDER — INSULIN ASPART 100 UNIT/ML ~~LOC~~ SOLN
0.0000 [IU] | SUBCUTANEOUS | Status: DC
  Administered 2014-02-16: 2 [IU] via SUBCUTANEOUS

## 2014-02-16 MED ORDER — INSULIN ASPART 100 UNIT/ML ~~LOC~~ SOLN: 0.0000 [IU] | SUBCUTANEOUS | Status: DC

## 2014-02-16 NOTE — Progress Notes (Signed)
1 Day Post-Op Procedure(s) (LRB): CORONARY ARTERY BYPASS GRAFTING (CABG) x three,  using left internal mammary artery and right leg greater saphenous vein harvested endoscopically (N/A) TRANSESOPHAGEAL ECHOCARDIOGRAM (TEE) (N/A) Subjective: The patient is doing well after multivessel CABG with minimal discomfort Maintaining sinus rhythm Out of bed to chair with assistance Chest x-ray clear pulmonary status satisfactory  Objective: Vital signs in last 24 hours: Temp:  [98 F (36.7 C)-100.4 F (38 C)] 98.2 F (36.8 C) (10/17 1500) Pulse Rate:  [79-93] 80 (10/17 1600) Cardiac Rhythm:  [-] Atrial paced (10/17 1600) Resp:  [0-39] 23 (10/17 1600) BP: (91-111)/(49-63) 100/57 mmHg (10/17 1600) SpO2:  [92 %-100 %] 95 % (10/17 1600) Arterial Line BP: (85-129)/(44-81) 110/54 mmHg (10/17 1600) Weight:  [194 lb 3.6 oz (88.1 kg)] 194 lb 3.6 oz (88.1 kg) (10/17 0500)  Hemodynamic parameters for last 24 hours: PAP: (28-41)/(5-18) 35/12 mmHg CO:  [4.1 L/min-4.7 L/min] 4.1 L/min CI:  [2.1 L/min/m2-2.4 L/min/m2] 2.1 L/min/m2  Intake/Output from previous day: 10/16 0701 - 10/17 0700 In: 6134.4 [I.V.:3794.4; Blood:890; IV Piggyback:1450] Out: 6150 [Urine:4215; Blood:1405; Chest Tube:530] Intake/Output this shift: Total I/O In: 805 [P.O.:360; I.V.:395; IV Piggyback:50] Out: 725 [Urine:595; Chest Tube:130]  Alert and comfortable Lungs clear Extremities warm Neuro intact  Lab Results:  Recent Labs  02/15/14 1810  02/16/14 0430 02/16/14 1707  WBC 10.9*  --  11.3*  --   HGB 10.4*  < > 9.7* 10.9*  HCT 30.8*  < > 29.0* 32.0*  PLT 126*  --  125*  --   < > = values in this interval not displayed. BMET:  Recent Labs  02/15/14 0225  02/16/14 0430 02/16/14 1707  NA 140  < > 140 138  K 3.8  < > 4.0 3.9  CL 103  < > 107 104  CO2 24  --  21  --   GLUCOSE 87  < > 132* 133*  BUN 17  < > 9 10  CREATININE 0.96  < > 0.72 0.80  CALCIUM 9.7  --  8.6  --   < > = values in this interval not  displayed.  PT/INR:  Recent Labs  02/15/14 1200  LABPROT 19.5*  INR 1.64*   ABG    Component Value Date/Time   PHART 7.344* 02/15/2014 1951   HCO3 21.5 02/15/2014 1951   TCO2 21 02/16/2014 1707   ACIDBASEDEF 4.0* 02/15/2014 1951   O2SAT 94.0 02/15/2014 1951   CBG (last 3)   Recent Labs  02/16/14 0018 02/16/14 0414 02/16/14 0806  GLUCAP 135* 128* 119*    Assessment/Plan: S/P Procedure(s) (LRB): CORONARY ARTERY BYPASS GRAFTING (CABG) x three,  using left internal mammary artery and right leg greater saphenous vein harvested endoscopically (N/A) TRANSESOPHAGEAL ECHOCARDIOGRAM (TEE) (N/A) See progression orders Lasix twice a day for fluid retention following CABG  LOS: 6 days    VAN TRIGT III,Leili Eskenazi 02/16/2014

## 2014-02-16 NOTE — Progress Notes (Signed)
Patient has been up sitting in the chair for a few hours with no c/o of pain or discomfort. VS remain stable with neo at 20/hr. Attempted to get patient up to walk but she when she stood she stated that she felt very warm, she was unable to remain focused and keep her head up. Instructed to her to sit back into the chair when her eyes rolled to the back of head and she became limp and was unable to assist staff. RN called for extra help and we were able to get patient back into the chair when she seemed to come back to and carried on to be alert and oriented. She was able to recall every detail about the previous event. VS remained the same. Will continue to monitor. Franki CabotBrianna Nicolai Labonte, RN

## 2014-02-17 ENCOUNTER — Inpatient Hospital Stay (HOSPITAL_COMMUNITY): Payer: Medicare PPO

## 2014-02-17 LAB — BASIC METABOLIC PANEL
Anion gap: 10 (ref 5–15)
BUN: 12 mg/dL (ref 6–23)
CO2: 23 mEq/L (ref 19–32)
Calcium: 8.8 mg/dL (ref 8.4–10.5)
Chloride: 105 mEq/L (ref 96–112)
Creatinine, Ser: 0.79 mg/dL (ref 0.50–1.10)
GFR calc Af Amer: 89 mL/min — ABNORMAL LOW (ref >90)
GFR calc non Af Amer: 77 mL/min — ABNORMAL LOW (ref >90)
Glucose, Bld: 107 mg/dL — ABNORMAL HIGH (ref 70–99)
Potassium: 3.7 mEq/L (ref 3.7–5.3)
Sodium: 138 mEq/L (ref 137–147)

## 2014-02-17 LAB — GLUCOSE, CAPILLARY
GLUCOSE-CAPILLARY: 98 mg/dL (ref 70–99)
GLUCOSE-CAPILLARY: 122 mg/dL — AB (ref 70–99)
GLUCOSE-CAPILLARY: 107 mg/dL — AB (ref 70–99)
GLUCOSE-CAPILLARY: 131 mg/dL — AB (ref 70–99)
Glucose-Capillary: 103 mg/dL — ABNORMAL HIGH (ref 70–99)
Glucose-Capillary: 104 mg/dL — ABNORMAL HIGH (ref 70–99)
Glucose-Capillary: 112 mg/dL — ABNORMAL HIGH (ref 70–99)
Glucose-Capillary: 120 mg/dL — ABNORMAL HIGH (ref 70–99)
Glucose-Capillary: 122 mg/dL — ABNORMAL HIGH (ref 70–99)
Glucose-Capillary: 122 mg/dL — ABNORMAL HIGH (ref 70–99)

## 2014-02-17 LAB — CBC
HCT: 28.3 % — ABNORMAL LOW (ref 36.0–46.0)
Hemoglobin: 9.5 g/dL — ABNORMAL LOW (ref 12.0–15.0)
MCH: 29.1 pg (ref 26.0–34.0)
MCHC: 33.6 g/dL (ref 30.0–36.0)
MCV: 86.5 fL (ref 78.0–100.0)
Platelets: 115 10*3/uL — ABNORMAL LOW (ref 150–400)
RBC: 3.27 MIL/uL — ABNORMAL LOW (ref 3.87–5.11)
RDW: 15.8 % — ABNORMAL HIGH (ref 11.5–15.5)
WBC: 11 10*3/uL — ABNORMAL HIGH (ref 4.0–10.5)

## 2014-02-17 MED ORDER — FUROSEMIDE 10 MG/ML IJ SOLN: 20.0000 mg | Freq: Every day | INTRAMUSCULAR | Status: DC

## 2014-02-17 MED ORDER — ENOXAPARIN SODIUM 30 MG/0.3ML ~~LOC~~ SOLN
30.0000 mg | SUBCUTANEOUS | Status: DC
  Administered 2014-02-17: 30 mg via SUBCUTANEOUS
  Filled 2014-02-17 (×2): qty 0.3

## 2014-02-17 MED ORDER — POTASSIUM CHLORIDE 10 MEQ/50ML IV SOLN
10.0000 meq | INTRAVENOUS | Status: AC
  Administered 2014-02-17 (×3): 10 meq via INTRAVENOUS
  Filled 2014-02-17 (×3): qty 50

## 2014-02-17 MED ORDER — PHENYLEPHRINE HCL 10 MG/ML IJ SOLN
0.0000 ug/min | INTRAVENOUS | Status: DC
  Administered 2014-02-17 – 2014-02-18 (×2): 10 ug/min via INTRAVENOUS
  Filled 2014-02-17 (×3): qty 1

## 2014-02-17 NOTE — Progress Notes (Signed)
2 Days Post-Op Procedure(s) (LRB): CORONARY ARTERY BYPASS GRAFTING (CABG) x three,  using left internal mammary artery and right leg greater saphenous vein harvested endoscopically (N/A) TRANSESOPHAGEAL ECHOCARDIOGRAM (TEE) (N/A) Subjective: Weaned off neo this am Very weak - not out of room yet- orthostatic -vagal episodes PT ordered  Objective: Vital signs in last 24 hours: Temp:  [98 F (36.7 C)-98.6 F (37 C)] 98.6 F (37 C) (10/18 0700) Pulse Rate:  [79-81] 79 (10/18 0900) Cardiac Rhythm:  [-] Atrial paced (10/18 0800) Resp:  [14-37] 32 (10/18 0900) BP: (81-119)/(47-73) 115/57 mmHg (10/18 0900) SpO2:  [90 %-98 %] 94 % (10/18 0900) Arterial Line BP: (90-136)/(44-85) 117/55 mmHg (10/18 0700) Weight:  [194 lb 0.1 oz (88 kg)] 194 lb 0.1 oz (88 kg) (10/18 0600)  Hemodynamic parameters for last 24 hours:  nsr  Intake/Output from previous day: 10/17 0701 - 10/18 0700 In: 1254.5 [P.O.:360; I.V.:794.5; IV Piggyback:100] Out: 1610 [[id number] [Urine:1645; Chest Tube:130] Intake/Output this shift: Total I/O In: 100 [IV Piggyback:100] Out: 150 [Urine:150]  Neuro intact Lungs clear  Lab Results:  Recent Labs  02/16/14 1715 02/17/14 0355  WBC 11.9* 11.0*  HGB 10.2* 9.5*  HCT 30.5* 28.3*  PLT 131* 115*   BMET:  Recent Labs  02/16/14 0430 02/16/14 1707 02/16/14 1715 02/17/14 0355  NA 140 138  --  138  K 4.0 3.9  --  3.7  CL 107 104  --  105  CO2 21  --   --  23  GLUCOSE 132* 133*  --  107*  BUN 9 10  --  12  CREATININE 0.72 0.80 0.81 0.79  CALCIUM 8.6  --   --  8.8    PT/INR:  Recent Labs  02/15/14 1200  LABPROT 19.5*  INR 1.64*   ABG    Component Value Date/Time   PHART 7.344* 02/15/2014 1951   HCO3 21.5 02/15/2014 1951   TCO2 21 02/16/2014 1707   ACIDBASEDEF 4.0* 02/15/2014 1951   O2SAT 94.0 02/15/2014 1951   CBG (last 3)   Recent Labs  02/17/14 0005 02/17/14 0407 02/17/14 0800  GLUCAP 122* 104* 107*    Assessment/Plan: S/P Procedure(s)  (LRB): CORONARY ARTERY BYPASS GRAFTING (CABG) x three,  using left internal mammary artery and right leg greater saphenous vein harvested endoscopically (N/A) TRANSESOPHAGEAL ECHOCARDIOGRAM (TEE) (N/A) Cont current care- ICU today   LOS: 7 days    [name] [name],[name] [name] 02/17/2014

## 2014-02-17 NOTE — Progress Notes (Signed)
CT surgery p.m. Rounds  Patient back on low-dose Neo-Synephrine drip for maintenance of systolic pressure greater than 100 and improved urine output Maintaining sinus rhythm Patient felt stronger today and was able to be late in the hallway 70 feet  Resting comfortably this evening  Plan-wean neo-drip as tolerated

## 2014-02-17 NOTE — Plan of Care (Signed)
Problem: Problem: Cardiovascular Progression Goal: HEMODYNAMIC STABILITY Outcome: Progressing Pt back on vasoactive gtts today Goal: NO EVIDENCE OF VOLUME OVERLOAD Outcome: Progressing Delicate volume status balance currently, addressed on rounds today  Problem: Problem: Mobility Progression Goal: INCREASED MOBILITY OR STRENGTH Outcome: Progressing First ambulation today Goal: NO EVIDENCE OF LIGHTHEADED/DIZZINESS Outcome: Progressing Improved at end of day today Goal: NO EVIDENCE OF SHORTNESS OF BREATH WITH EXERTION Outcome: Progressing Improved respiratory response/tolerance with activity

## 2014-02-17 NOTE — Progress Notes (Signed)
Dr. Donata ClayVan Trigt notified that patient's UO has dropped since she diuresed this morning with her 20mg  Lasix dose. Her SBP was in the 90's with a MAP of 64. New orders were received to start back Neo at 10mcg and titrate for BP management. Will continue monitor. Franki CabotBrianna Chritopher Coster, RN

## 2014-02-18 ENCOUNTER — Inpatient Hospital Stay (HOSPITAL_COMMUNITY): Payer: Medicare PPO

## 2014-02-18 LAB — CBC
HCT: 27.4 % — ABNORMAL LOW (ref 36.0–46.0)
Hemoglobin: 9.2 g/dL — ABNORMAL LOW (ref 12.0–15.0)
MCH: 28.5 pg (ref 26.0–34.0)
MCHC: 33.6 g/dL (ref 30.0–36.0)
MCV: 84.8 fL (ref 78.0–100.0)
Platelets: 140 10*3/uL — ABNORMAL LOW (ref 150–400)
RBC: 3.23 MIL/uL — ABNORMAL LOW (ref 3.87–5.11)
RDW: 16 % — ABNORMAL HIGH (ref 11.5–15.5)
WBC: 9.9 10*3/uL (ref 4.0–10.5)

## 2014-02-18 LAB — BASIC METABOLIC PANEL
Anion gap: 11 (ref 5–15)
BUN: 13 mg/dL (ref 6–23)
CO2: 23 mEq/L (ref 19–32)
Calcium: 9.1 mg/dL (ref 8.4–10.5)
Chloride: 105 mEq/L (ref 96–112)
Creatinine, Ser: 0.81 mg/dL (ref 0.50–1.10)
GFR calc Af Amer: 78 mL/min — ABNORMAL LOW (ref >90)
GFR calc non Af Amer: 67 mL/min — ABNORMAL LOW (ref >90)
Glucose, Bld: 106 mg/dL — ABNORMAL HIGH (ref 70–99)
Potassium: 3.8 mEq/L (ref 3.7–5.3)
Sodium: 139 mEq/L (ref 137–147)

## 2014-02-18 LAB — GLUCOSE, CAPILLARY: Glucose-Capillary: 120 mg/dL — ABNORMAL HIGH (ref 70–99)

## 2014-02-18 MED ORDER — ENOXAPARIN SODIUM 40 MG/0.4ML ~~LOC~~ SOLN
40.0000 mg | SUBCUTANEOUS | Status: DC
  Administered 2014-02-18 – 2014-02-21 (×4): 40 mg via SUBCUTANEOUS
  Filled 2014-02-18 (×4): qty 0.4

## 2014-02-18 MED ORDER — MIDODRINE HCL 5 MG PO TABS
10.0000 mg | ORAL_TABLET | Freq: Three times a day (TID) | ORAL | Status: DC
  Administered 2014-02-18 – 2014-02-20 (×7): 10 mg via ORAL
  Filled 2014-02-18 (×10): qty 2

## 2014-02-18 MED ORDER — POTASSIUM CHLORIDE CRYS ER 20 MEQ PO TBCR
40.0000 meq | EXTENDED_RELEASE_TABLET | Freq: Once | ORAL | Status: AC
  Administered 2014-02-18: 40 meq via ORAL
  Filled 2014-02-18: qty 2

## 2014-02-18 MED FILL — Phenylephrine HCl Inj 10 MG/ML: INTRAMUSCULAR | Qty: 2 | Status: AC

## 2014-02-18 MED FILL — Potassium Chloride Inj 2 mEq/ML: INTRAVENOUS | Qty: 40 | Status: AC

## 2014-02-18 MED FILL — Dextrose Inj 5%: INTRAVENOUS | Qty: 250 | Status: AC

## 2014-02-18 MED FILL — Heparin Sodium (Porcine) Inj 1000 Unit/ML: INTRAMUSCULAR | Qty: 30 | Status: AC

## 2014-02-18 MED FILL — Magnesium Sulfate Inj 50%: INTRAMUSCULAR | Qty: 10 | Status: AC

## 2014-02-18 NOTE — Evaluation (Signed)
Physical Therapy Evaluation Patient Details Name: [name] [name] MRN: [id number] DOB: [birth year] Today's Date: 02/18/2014   History of Present Illness  59F with HTN, HLD, hypothyroidism who presents with progressive anginal symptoms pt now s/p CABGx3 with orthostasis during admission  Clinical Impression  Pt moving well with cues throughout for precautions, transfers and function. Pt with drop in BP with initial standing but with static standing BP elevated prior to ambulation and no presyncope throughout session. Pt educated for all precautions including slow transition for BP to accommodate to change. Pt with decreased transfers, gait and function who will benefit from acute therapy to maximize mobility, gait, safety and function prior to D/C.    Follow Up Recommendations Home health PT;Supervision/Assistance - 24 hour    Equipment Recommendations  Rolling walker with 5" wheels    Recommendations for Other Services OT consult     Precautions / Restrictions Precautions Precautions: Sternal;Fall      Mobility  Bed Mobility               General bed mobility comments: pt in recliner on arrival  Transfers Overall transfer level: Needs assistance   Transfers: Sit to/from Stand Sit to Stand: Min assist         General transfer comment: cues for hand placement, sequence, safety and anterior translation  Ambulation/Gait Ambulation/Gait assistance: Min guard Ambulation Distance (Feet): 150 Feet Assistive device: Rolling walker (2 wheeled) Gait Pattern/deviations: Step-through pattern;Decreased stride length   Gait velocity interpretation: Below normal speed for age/gender    Stairs            Wheelchair Mobility    Modified Rankin (Stroke Patients Only)       Balance Overall balance assessment: Needs assistance   Sitting balance-Leahy Scale: Good       Standing balance-Leahy Scale: Fair                               Pertinent  Vitals/Pain Pain Assessment: No/denies pain HR 81 BP sitting 116/63, drop to 85/58 with initial standing, 106/61 standing after 3 min    Home Living Family/patient expects to be discharged to:: Private residence Living Arrangements: Children Available Help at Discharge: Family;Available 24 hours/day Type of Home: House Home Access: Stairs to enter Entrance Stairs-Rails: None Entrance Stairs-Number of Steps: 3 Home Layout: One level Home Equipment: None Additional Comments: pt reports toilets are taller    Prior Function Level of Independence: Independent               Hand Dominance        Extremity/Trunk Assessment   Upper Extremity Assessment: Generalized weakness           Lower Extremity Assessment: Generalized weakness      Cervical / Trunk Assessment: Normal  Communication   Communication: No difficulties  Cognition Arousal/Alertness: Awake/alert Behavior During Therapy: WFL for tasks assessed/performed Overall Cognitive Status: Within Functional Limits for tasks assessed                      General Comments      Exercises General Exercises - Lower Extremity Long Arc Quad: AROM;Seated;Both;10 reps      Assessment/Plan    PT Assessment Patient needs continued PT services  PT Diagnosis Difficulty walking;Generalized weakness   PT Problem List Decreased strength;Decreased activity tolerance;Decreased balance;Decreased knowledge of precautions;Decreased knowledge of use of DME;Decreased mobility  PT Treatment Interventions Gait training;Stair training;DME  instruction;Functional mobility training;Therapeutic activities;Therapeutic exercise;Patient/family education   PT Goals (Current goals can be found in the Care Plan section) Acute Rehab PT Goals Patient Stated Goal: return home PT Goal Formulation: With patient/family Time For Goal Achievement: 03/04/14 Potential to Achieve Goals: Good    Frequency Min 3X/week   Barriers to  discharge        Co-evaluation               End of Session Equipment Utilized During Treatment: Gait belt Activity Tolerance: Patient tolerated treatment well Patient left: in chair;with call bell/phone within reach;with family/visitor present Nurse Communication: Mobility status;Precautions         Time: [id number] PT Time Calculation (min): 21 min   Charges:   PT Evaluation $Initial PT Evaluation Tier I: 1 Procedure PT Treatments $Gait Training: 8-22 mins   PT G CodesDelorse Lek:          Tabor, Jashay Roddy Beth 02/18/2014, 9:44 AM Delaney MeigsMaija Tabor Glendell Fouse, PT [phone]

## 2014-02-18 NOTE — Progress Notes (Signed)
POD # 3 CABG  Visiting with family  BP 109/50  Pulse 76  Temp(Src) 98.9 F (37.2 C) (Oral)  Resp 19  Ht 5' 4.96" (1.65 m)  Wt 190 lb 11.2 oz (86.5 kg)  BMI 31.77 kg/m2  SpO2 93%   Intake/Output Summary (Last 24 hours) at 02/18/14 1723 Last data filed at 02/18/14 1600  Gross per 24 hour  Intake   1461 ml  Output   2250 ml  Net   -789 ml    Started on midodrine earlier today Neo being weaned

## 2014-02-18 NOTE — Progress Notes (Signed)
3 Days Post-Op Procedure(s) (LRB): CORONARY ARTERY BYPASS GRAFTING (CABG) x three,  using left internal mammary artery and right leg greater saphenous vein harvested endoscopically (N/A) TRANSESOPHAGEAL ECHOCARDIOGRAM (TEE) (N/A) Subjective:  No complaints  She is still on neo and had a hot flash and dizziness when walking a little this am. She passed out yesterday while walking only to her door.  Objective: Vital signs in last 24 hours: Temp:  [97.4 F (36.3 C)-98.4 F (36.9 C)] 98.4 F (36.9 C) (10/19 0731) Pulse Rate:  [75-85] 80 (10/19 0700) Cardiac Rhythm:  [-] Atrial paced (10/18 2000) Resp:  [15-32] 17 (10/19 0700) BP: (84-129)/(44-68) 105/59 mmHg (10/19 0700) SpO2:  [88 %-99 %] 96 % (10/19 0700) Weight:  [86.5 kg (190 lb 11.2 oz)] 86.5 kg (190 lb 11.2 oz) (10/19 0500)  Hemodynamic parameters for last 24 hours:    Intake/Output from previous day: 10/18 0701 - 10/19 0700 In: 1429.6 [P.O.:640; I.V.:689.6; IV Piggyback:100] Out: 2351 [Urine:2350; Stool:1] Intake/Output this shift:    General appearance: alert and cooperative Neurologic: intact Heart: regular rate and rhythm, S1, S2 normal, no murmur, click, rub or gallop Lungs: diminished breath sounds bibasilar Extremities: extremities normal, atraumatic, no cyanosis or edema Wound: incision ok  Lab Results:  Recent Labs  02/17/14 0355 02/18/14 0354  WBC 11.0* 9.9  HGB 9.5* 9.2*  HCT 28.3* 27.4*  PLT 115* 140*   BMET:  Recent Labs  02/17/14 0355 02/18/14 0354  NA 138 139  K 3.7 3.8  CL 105 105  CO2 23 23  GLUCOSE 107* 106*  BUN 12 13  CREATININE 0.79 0.81  CALCIUM 8.8 9.1    PT/INR:  Recent Labs  02/15/14 1200  LABPROT 19.5*  INR 1.64*   ABG    Component Value Date/Time   PHART 7.344* 02/15/2014 1951   HCO3 21.5 02/15/2014 1951   TCO2 21 02/16/2014 1707   ACIDBASEDEF 4.0* 02/15/2014 1951   O2SAT 94.0 02/15/2014 1951   CBG (last 3)   Recent Labs  02/17/14 0800 02/17/14 1148  02/17/14 1622  GLUCAP 107* 131* 120*   CXR: bibasilar atelectasis and small effusions.  Assessment/Plan: S/P Procedure(s) (LRB): CORONARY ARTERY BYPASS GRAFTING (CABG) x three,  using left internal mammary artery and right leg greater saphenous vein harvested endoscopically (N/A) TRANSESOPHAGEAL ECHOCARDIOGRAM (TEE) (N/A)  She is still vasopressor dependent. Will start Midodrine so that neo can be weaned off. I think this is probably a combination of age, obesity, debilitation from a week of not moving much preop, mild anemia. She has not received any beta blocker over the weekend. Will stop this for now.  Continue mobilization and IS.  Will need to stay in ICU until BP stable off neo.   LOS: 8 days    Alaja Goldinger K 02/18/2014

## 2014-02-19 ENCOUNTER — Encounter (HOSPITAL_COMMUNITY): Payer: Self-pay | Admitting: Surgery

## 2014-02-19 LAB — GLUCOSE, CAPILLARY
GLUCOSE-CAPILLARY: 114 mg/dL — AB (ref 70–99)
Glucose-Capillary: 112 mg/dL — ABNORMAL HIGH (ref 70–99)

## 2014-02-19 MED ORDER — POTASSIUM CHLORIDE CRYS ER 20 MEQ PO TBCR
40.0000 meq | EXTENDED_RELEASE_TABLET | Freq: Every day | ORAL | Status: AC
  Administered 2014-02-19 – 2014-02-21 (×3): 40 meq via ORAL
  Filled 2014-02-19 (×3): qty 2

## 2014-02-19 MED ORDER — SODIUM CHLORIDE 0.9 % IJ SOLN
3.0000 mL | Freq: Two times a day (BID) | INTRAMUSCULAR | Status: DC
  Administered 2014-02-19 – 2014-02-21 (×5): 3 mL via INTRAVENOUS

## 2014-02-19 MED ORDER — MOVING RIGHT ALONG BOOK
Freq: Once | Status: DC
  Filled 2014-02-19: qty 1

## 2014-02-19 MED ORDER — PANTOPRAZOLE SODIUM 40 MG PO TBEC
40.0000 mg | DELAYED_RELEASE_TABLET | Freq: Every day | ORAL | Status: DC
  Administered 2014-02-20: 40 mg via ORAL
  Filled 2014-02-19: qty 1

## 2014-02-19 MED ORDER — TRAMADOL HCL 50 MG PO TABS: 50.0000 mg | ORAL_TABLET | ORAL | Status: DC | PRN

## 2014-02-19 MED ORDER — ACETAMINOPHEN 325 MG PO TABS: 650.0000 mg | ORAL_TABLET | Freq: Four times a day (QID) | ORAL | Status: DC | PRN

## 2014-02-19 MED ORDER — SODIUM CHLORIDE 0.9 % IV SOLN: 250.0000 mL | INTRAVENOUS | Status: DC | PRN

## 2014-02-19 MED ORDER — ONDANSETRON HCL 4 MG PO TABS: 4.0000 mg | ORAL_TABLET | Freq: Four times a day (QID) | ORAL | Status: DC | PRN

## 2014-02-19 MED ORDER — ONDANSETRON HCL 4 MG/2ML IJ SOLN: 4.0000 mg | Freq: Four times a day (QID) | INTRAMUSCULAR | Status: DC | PRN

## 2014-02-19 MED ORDER — FUROSEMIDE 40 MG PO TABS
40.0000 mg | ORAL_TABLET | Freq: Every day | ORAL | Status: AC
  Administered 2014-02-19 – 2014-02-21 (×3): 40 mg via ORAL
  Filled 2014-02-19 (×4): qty 1

## 2014-02-19 MED ORDER — INSULIN ASPART 100 UNIT/ML ~~LOC~~ SOLN
0.0000 [IU] | Freq: Three times a day (TID) | SUBCUTANEOUS | Status: DC
Start: 2014-02-19 — End: 2014-02-21
  Administered 2014-02-20: 2 [IU] via SUBCUTANEOUS

## 2014-02-19 MED ORDER — SODIUM CHLORIDE 0.9 % IJ SOLN: 3.0000 mL | INTRAMUSCULAR | Status: DC | PRN

## 2014-02-19 MED ORDER — DOCUSATE SODIUM 100 MG PO CAPS
200.0000 mg | ORAL_CAPSULE | Freq: Every day | ORAL | Status: DC
  Administered 2014-02-20 – 2014-02-21 (×2): 200 mg via ORAL
  Filled 2014-02-19 (×2): qty 2

## 2014-02-19 MED ORDER — OXYCODONE HCL 5 MG PO TABS: 5.0000 mg | ORAL_TABLET | ORAL | Status: DC | PRN

## 2014-02-19 MED ORDER — ASPIRIN EC 325 MG PO TBEC
325.0000 mg | DELAYED_RELEASE_TABLET | Freq: Every day | ORAL | Status: DC
  Administered 2014-02-20 – 2014-02-21 (×2): 325 mg via ORAL
  Filled 2014-02-19 (×3): qty 1

## 2014-02-19 NOTE — Progress Notes (Signed)
Attempted to call report to 2W.  RN unable to take report.  To return call back.

## 2014-02-19 NOTE — Progress Notes (Signed)
4 Days Post-Op Procedure(s) (LRB): CORONARY ARTERY BYPASS GRAFTING (CABG) x three,  using left internal mammary artery and right leg greater saphenous vein harvested endoscopically (N/A) TRANSESOPHAGEAL ECHOCARDIOGRAM (TEE) (N/A) Subjective:  No complaints  She walked this am with nurse and felt good  BP has risen on Midodrine and neo is off  Has had BM.  Objective: Vital signs in last 24 hours: Temp:  [98.1 F (36.7 C)-98.9 F (37.2 C)] 98.1 F (36.7 C) (10/20 0700) Pulse Rate:  [67-85] 80 (10/20 0600) Cardiac Rhythm:  [-] Normal sinus rhythm (10/19 2000) Resp:  [17-35] 28 (10/20 0600) BP: (49-136)/(36-62) 126/55 mmHg (10/20 0600) SpO2:  [91 %-98 %] 96 % (10/20 0600) Weight:  [86.5 kg (190 lb 11.2 oz)] 86.5 kg (190 lb 11.2 oz) (10/20 0600)  Hemodynamic parameters for last 24 hours:    Intake/Output from previous day: 10/19 0701 - 10/20 0700 In: 999.8 [P.O.:600; I.V.:399.8] Out: 1600 [Urine:1600] Intake/Output this shift:    General appearance: alert and cooperative Heart: regular rate and rhythm, S1, S2 normal, no murmur, click, rub or gallop Lungs: diminished breath sounds LLL and RLL Extremities: edema minimal Wound: incisions ok  Lab Results:  Recent Labs  02/17/14 0355 02/18/14 0354  WBC 11.0* 9.9  HGB 9.5* 9.2*  HCT 28.3* 27.4*  PLT 115* 140*   BMET:  Recent Labs  02/17/14 0355 02/18/14 0354  NA 138 139  K 3.7 3.8  CL 105 105  CO2 23 23  GLUCOSE 107* 106*  BUN 12 13  CREATININE 0.79 0.81  CALCIUM 8.8 9.1    PT/INR: No results found for this basename: LABPROT, INR,  in the last 72 hours ABG    Component Value Date/Time   PHART 7.344* 02/15/2014 1951   HCO3 21.5 02/15/2014 1951   TCO2 21 02/16/2014 1707   ACIDBASEDEF 4.0* 02/15/2014 1951   O2SAT 94.0 02/15/2014 1951   CBG (last 3)   Recent Labs  02/17/14 0800 02/17/14 1148 02/17/14 1622  GLUCAP 107* 131* 120*    Assessment/Plan: S/P Procedure(s) (LRB): CORONARY ARTERY BYPASS  GRAFTING (CABG) x three,  using left internal mammary artery and right leg greater saphenous vein harvested endoscopically (N/A) TRANSESOPHAGEAL ECHOCARDIOGRAM (TEE) (N/A) Mobilize Diuresis: wt is 3 lbs over preop Diabetes: Hgb A1c 6.1 preop. Continue SSI Work on IS. Will check CXR in am. Plan for transfer to step-down: see transfer orders   LOS: 9 days    Duriel Deery K 02/19/2014

## 2014-02-19 NOTE — Progress Notes (Signed)
UR complete.  Navaya Wiatrek RN, MSN 

## 2014-02-19 NOTE — Progress Notes (Signed)
Attempted to call report to 2W.  RN unable to take report.  Will return call shortly.

## 2014-02-19 NOTE — Anesthesia Postprocedure Evaluation (Signed)
  Anesthesia Post-op Note  Patient: [name] ConnersEtrulia [name]  Procedure(s) Performed: Procedure(s): CORONARY ARTERY BYPASS GRAFTING (CABG) x three,  using left internal mammary artery and right leg greater saphenous vein harvested endoscopically (N/A) TRANSESOPHAGEAL ECHOCARDIOGRAM (TEE) (N/A)  Patient Location: PACU and SICU  Anesthesia Type:General  Level of Consciousness: sedated  Airway and Oxygen Therapy: 100% O2  Post-op Pain: mild  Post-op Assessment: Post-op Vital signs reviewed  Post-op Vital Signs: Reviewed  Last Vitals:  Filed Vitals:   02/18/14 2347  BP:   Pulse:   Temp: 37.1 C  Resp:     Complications: No apparent anesthesia complications

## 2014-02-20 ENCOUNTER — Inpatient Hospital Stay (HOSPITAL_COMMUNITY): Payer: Medicare PPO

## 2014-02-20 LAB — GLUCOSE, CAPILLARY
GLUCOSE-CAPILLARY: 103 mg/dL — AB (ref 70–99)
Glucose-Capillary: 126 mg/dL — ABNORMAL HIGH (ref 70–99)
Glucose-Capillary: 107 mg/dL — ABNORMAL HIGH (ref 70–99)
Glucose-Capillary: 106 mg/dL — ABNORMAL HIGH (ref 70–99)

## 2014-02-20 MED ORDER — MIDODRINE HCL 5 MG PO TABS
5.0000 mg | ORAL_TABLET | Freq: Two times a day (BID) | ORAL | Status: DC
  Administered 2014-02-20 – 2014-02-21 (×2): 5 mg via ORAL
  Filled 2014-02-20 (×4): qty 1

## 2014-02-20 NOTE — Progress Notes (Addendum)
[address]       [place] [id number]             [phone]      5 Days Post-Op Procedure(s) (LRB): CORONARY ARTERY BYPASS GRAFTING (CABG) x three,  using left internal mammary artery and right leg greater saphenous vein harvested endoscopically (N/A) TRANSESOPHAGEAL ECHOCARDIOGRAM (TEE) (N/A) Subjective: conts to make good progress  Objective: Vital signs in last 24 hours: Temp:  [98.4 F (36.9 C)-98.6 F (37 C)] 98.4 F (36.9 C) (10/21 0342) Pulse Rate:  [73-81] 73 (10/21 0342) Cardiac Rhythm:  [-] Normal sinus rhythm;Heart block (10/20 1930) Resp:  [18-31] 18 (10/21 0342) BP: (109-140)/(54-69) 140/65 mmHg (10/21 0342) SpO2:  [93 %-98 %] 98 % (10/21 0342) Weight:  [190 lb 4.1 oz (86.3 kg)] 190 lb 4.1 oz (86.3 kg) (10/21 0342)  Hemodynamic parameters for last 24 hours:    Intake/Output from previous day: 10/20 0701 - 10/21 0700 In: 480 [P.O.:480] Out: 900 [Urine:900] Intake/Output this shift:    General appearance: alert, cooperative and no distress Heart: regular rate and rhythm Lungs: dim in bases Abdomen: benign Extremities: min edema Wound: incis healing well  Lab Results:  Recent Labs  02/18/14 0354  WBC 9.9  HGB 9.2*  HCT 27.4*  PLT 140*   BMET:  Recent Labs  02/18/14 0354  NA 139  K 3.8  CL 105  CO2 23  GLUCOSE 106*  BUN 13  CREATININE 0.81  CALCIUM 9.1    PT/INR: No results found for this basename: LABPROT, INR,  in the last 72 hours ABG    Component Value Date/Time   PHART 7.344* 02/15/2014 1951   HCO3 21.5 02/15/2014 1951   TCO2 21 02/16/2014 1707   ACIDBASEDEF 4.0* 02/15/2014 1951   O2SAT 94.0 02/15/2014 1951   CBG (last 3)   Recent Labs  02/19/14 1712 02/19/14 2059 02/20/14 0623  GLUCAP 112* 114* 103*    Meds Scheduled Meds: . aspirin EC  325 mg Oral Daily  . atorvastatin  40 mg Oral Daily  . docusate sodium  200 mg Oral Daily  . enoxaparin (LOVENOX) injection  40 mg Subcutaneous Q24H  .  furosemide  40 mg Oral Daily  . insulin aspart  0-24 Units Subcutaneous TID AC & HS  . levothyroxine  100 mcg Oral QAC breakfast  . midodrine  10 mg Oral TID WC  . moving right along book   Does not apply Once  . pantoprazole  40 mg Oral QAC breakfast  . potassium chloride  40 mEq Oral Daily  . sodium chloride  3 mL Intravenous Q12H   Continuous Infusions:  PRN Meds:.sodium chloride, acetaminophen, ondansetron (ZOFRAN) IV, ondansetron, oxyCODONE, sodium chloride, traMADol  Xrays Dg Chest 2 View  02/20/2014   CLINICAL DATA:  Weakness, chest soreness, cardiac surgery  EXAM: CHEST  2 VIEW  COMPARISON:  Chest x-ray of 02/18/2014  FINDINGS: There is little change in moderate cardiomegaly with bilateral pleural effusions and basilar atelectasis. Median sternotomy sutures are noted from CABG. No bony abnormality is seen.  IMPRESSION: Little change in moderate cardiomegaly with pleural effusions and basilar atelectasis.   Electronically Signed   By: Dwyane DeePaul  Barry M.D.   On: 02/20/2014 08:28    Assessment/Plan: S/P Procedure(s) (LRB): CORONARY ARTERY BYPASS GRAFTING (CABG) x three,  using left internal mammary artery and right leg greater saphenous vein harvested endoscopically (N/A) TRANSESOPHAGEAL ECHOCARDIOGRAM (TEE) (N/A)  1 doing well 2 cont gentle diuresis/pulm  toilet /rehab 3 BP is elevated, may consider d/c midodrine soon 4 rhythm stable 5 sugars controlled 6 d/c epw's 7 poss home 24-48 hours  LOS: 10 days    GOLD,WAYNE E 02/20/2014   Chart reviewed, patient examined, agree with above. Her BP is 140 this am so will back off on the Midodrine to 5 bid.  CXR shows mild bibasilar atelectasis and effusions. Continue diuretic. Continue IS and ambulation Wean oxygen

## 2014-02-20 NOTE — Discharge Summary (Signed)
[address]       [place] [id number]             [phone]              Discharge Summary  Name: [name] Connerstrulia [name] DOB: 1932-10-17 80 y.o. MRN: [id number]   Admission Date: 02/10/2014 Discharge Date:     Admitting Diagnosis: Chest pain   Discharge Diagnosis:  Severe single vessel coronary artery disease Non-ST elevation myocardial infarction Expected postoperative blood loss anemia Postoperative hypotension  Past Medical History  Diagnosis Date  . Hypertension   . High cholesterol      Procedures: CORONARY ARTERY BYPASS GRAFTING x 3 (Left internal mammary artery to left anterior descending, saphenous vein graft to diagonal 1, saphenous vein graft to diagonal 2) ENDOSCOPIC VEIN HARVEST RIGHT LEG -  02/15/2014    HPI:  The patient is a 80 y.o. female with no prior history of coronary artery disease.  She reports a several month history of tiredness and shortness of breath, fatigue with exertion. She has been walking on a treadmill at home but over the past few weeks she has had to stop after a couple minutes due to a tired sensation in her chest and left arm. On Sunday she finished preparing a meal and afterwards developed sudden severe chest pain at rest. [facility] for further evaluation. She was seen by cardiology and admitted for catheterization.    Hospital Course:  The [facility]. ECG showed nonspecific changes and Troponin was 0.52. The peak was 1.02. Catheterization showed the LAD to be heavily calcified with a 95% stenosis in the proximal vessel at the takeoff of the first septal. The mid LAD was occluded at the takeoff of the first and second diagonals with faint filling of the distal LAD by right to left collaterals. EF was 35% with severe anterior hypo and apical akinesis. LVEDP was 29. She had 4/10 chest pain at the end of the procedure that resolved with nitroglycerin.  She had no further pain after starting heparin and nitroglycerin.  A cardiac surgery consult was requested and Dr. Laneta SimmersBartle saw the patient.  He recommended proceeding with surgical revascularization. All risks, benefits and alternatives of surgery were explained in detail, and the patient agreed to proceed. The patient was taken to the operating room and underwent the above procedure.    The postoperative course was notable for early hypotension requiring pressors adn Midodrine. Blood pressures eventually recovered and all drips were weaned and discontinued. She also was volume overloaded and was started on an aggressive diuretic regimen.  Her condition slowly improved and she was transferred from the ICU to stepdown for further convalescence.  The patient has overall remained stable.  Blood pressures are improving and Midodrine has been discontinued.  She has been ambulating in the hall with cardiac rehab and is progressing well. She is tolerating a regular diet and continues to diurese well. Pre-op hemoglobin A1C was 6.1 and she has been maintained postoperatively on sliding scale insulin with good control.  Incisions are healing well.  We anticipate discharge home in the next 24-48 hours provided no acute changes occur and she continues to progress as expected.     Recent vital signs:  Filed Vitals:   02/20/14 0342  BP: 140/65  Pulse: 73  Temp: 98.4 F (36.9 C)  Resp: 18    Recent laboratory studies:  CBC:  Recent Labs  02/18/14 0354  WBC 9.9  HGB 9.2*  HCT 27.4*  PLT 140*   BMET:  Recent Labs  02/18/14 0354  NA 139  K 3.8  CL 105  CO2 23  GLUCOSE 106*  BUN 13  CREATININE 0.81  CALCIUM 9.1    PT/INR: No results found for this basename: LABPROT, INR,  in the last 72 hours   Discharge Medications:     Medication List    STOP taking these medications       amlodipine-atorvastatin 10-10 MG per tablet  Commonly known as:  CADUET      TAKE these medications        aspirin 325 MG EC tablet  Take 1 tablet (325 mg total) by mouth daily.     atorvastatin 40 MG tablet  Commonly known as:  LIPITOR  Take 1 tablet (40 mg total) by mouth daily.     CITRACAL PO  Take 1 tablet by mouth daily.     levothyroxine 100 MCG tablet  Commonly known as:  SYNTHROID, LEVOTHROID  Take 100 mcg by mouth daily.     oxyCODONE 5 MG immediate release tablet  Commonly known as:  Oxy IR/ROXICODONE  Take 1-2 tablets (5-10 mg total) by mouth every 6 (six) hours as needed for severe pain.     Vitamin D3 2000 UNITS Tabs  Take 2,000 Units by mouth daily.         Discharge Instructions:  The patient is to refrain from driving, heavy lifting or strenuous activity.  May shower daily and clean incisions with soap and water.  May resume regular diet.   Follow-up Information   Follow up with Donato SchultzSKAINS, MARK, MD.   Specialty:  Cardiology   Contact information:   [address] [place] [address] [phone]       Follow up with Alleen BorneBARTLE,BRYAN K, MD On 03/27/2014. ([facility] at 12:00, then see MD at 1:00)    Specialty:  Cardiothoracic Surgery   Contact information:   [address] [place] [address] [phone]        The patient has been discharged on:  1.Beta Blocker: Yes [  ]  No [ x ]  If No, reason: hypotension   2.Ace Inhibitor/ARB: Yes [ ]   No [ x ]  If No, reason: hypotension   3.Statin: Yes [ x ]  No [ ]   If No, reason:    4.Ecasa: Yes [ x ]  No [ ]   If No, reason:     COLLINS,GINA H 02/20/2014, 10:32 AM

## 2014-02-20 NOTE — Progress Notes (Signed)
CARDIAC REHAB PHASE I   PRE:  Rate/Rhythm: 70 SR    BP: sitting 112/60    SaO2: 95 1L  MODE:  Ambulation: 300 ft   POST:  Rate/Rhythm: 92 SR    BP: sitting 116/64     SaO2: 92 RA  Pt moving very well. No orthostasis noted. Pt did feel warm but able to take off her robe and feel better. Used RW, assist x1. Some SOB walking, SaO2 90-92 RA. Left O2 off in room in recliner. Pt has RW at home.  [phone]  Elissa LovettReeve, Lamesha Tibbits PelkieKristan CES, ACSM 02/20/2014 10:15 AM

## 2014-02-20 NOTE — Progress Notes (Signed)
Pt assessment unchanged.  Endocardial pacing wires removed.  No drainage or signs of infection at insertion site.  Pt vital signs WDL and blood pressure WDL according to pt's normal blood pressure pattern.  Pt monitored according to protocol.  Pt in bed resting, bed in lowest position with call bell in reach.  [name],[name] Pine A 02/20/2014

## 2014-02-21 DIAGNOSIS — I959 Hypotension, unspecified: Secondary | ICD-10-CM

## 2014-02-21 DIAGNOSIS — Z951 Presence of aortocoronary bypass graft: Secondary | ICD-10-CM

## 2014-02-21 DIAGNOSIS — I9589 Other hypotension: Secondary | ICD-10-CM

## 2014-02-21 LAB — GLUCOSE, CAPILLARY: GLUCOSE-CAPILLARY: 91 mg/dL (ref 70–99)

## 2014-02-21 MED ORDER — ATORVASTATIN CALCIUM 40 MG PO TABS: 40.0000 mg | ORAL_TABLET | Freq: Every day | ORAL | Status: DC

## 2014-02-21 MED ORDER — OXYCODONE HCL 5 MG PO TABS: 5.0000 mg | ORAL_TABLET | Freq: Four times a day (QID) | ORAL | Status: DC | PRN

## 2014-02-21 MED ORDER — ASPIRIN 325 MG PO TBEC: 325.0000 mg | DELAYED_RELEASE_TABLET | Freq: Every day | ORAL | Status: DC

## 2014-02-21 NOTE — Discharge Instructions (Signed)
Endoscopic Saphenous Vein Harvesting °Care After °Refer to this sheet in the next few weeks. These instructions provide you with information on caring for yourself after your procedure. Your health care provider may also give you more specific instructions. Your treatment has been planned according to current medical practices, but problems sometimes occur. Call your health care provider if you have any problems or questions after your procedure. °HOME CARE INSTRUCTIONS °Medicine °· Take whatever pain medicine your surgeon prescribes. Follow the directions carefully. Do not take over-the-counter pain medicine unless your surgeon says it is okay. Some pain medicine can cause bleeding problems for several weeks after surgery. °· Follow your surgeon's instructions about driving. You will probably not be permitted to drive after heart surgery. °· Take any medicines your surgeon prescribes. Any medicines you took before your heart surgery should be checked with your health care provider before you start taking them again. °Wound care °· If your surgeon has prescribed an elastic bandage or stocking, ask how long you should wear it. °· Check the area around your surgical cuts (incisions) whenever your bandages (dressings) are changed. Look for any redness or swelling. °· You will need to return to have the stitches (sutures) or staples taken out. Ask your surgeon when to do that. °· Ask your surgeon when you can shower or bathe. °Activity °· Try to keep your legs raised when you are sitting. °· Do any exercises your health care providers have given you. These may include deep breathing exercises, coughing, walking, or other exercises. °SEEK MEDICAL CARE IF: °· You have any questions about your medicines. °· You have more leg pain, especially if your pain medicine stops working. °· New or growing bruises develop on your leg. °· Your leg swells, feels tight, or becomes red. °· You have numbness in your leg. °SEEK IMMEDIATE  MEDICAL CARE IF: °· Your pain gets much worse. °· Blood or fluid leaks from any of the incisions. °· Your incisions become warm, swollen, or red. °· You have chest pain. °· You have trouble breathing. °· You have a fever. °· You have more pain near your leg incision. °MAKE SURE YOU: °· Understand these instructions. °· Will watch your condition. °· Will get help right away if you are not doing well or get worse. °Document Released: 12/30/2010 Document Revised: 04/24/2013 Document Reviewed: 12/30/2010 °ExitCare® Patient Information ©2015 [employer]. This information is not intended to replace advice given to you by your health care provider. Make sure you discuss any questions you have with your health care provider. °Coronary Artery Bypass Grafting, Care After °These instructions give you information on caring for yourself after your procedure. Your doctor may also give you more specific instructions. Call your doctor if you have any problems or questions after your procedure.  °HOME CARE °· Only take medicine as told by your doctor. Take medicines exactly as told. Do not stop taking medicines or start any new medicines without talking to your doctor first. °· Take your pulse as told by your doctor. °· Do deep breathing as told by your doctor. Use your breathing device (incentive spirometer), if given, to practice deep breathing several times a day. Support your chest with a pillow or your arms when you take deep breaths or cough. °· Keep the area clean, dry, and protected where the surgery cuts (incisions) were made. Remove bandages (dressings) only as told by your doctor. If strips were applied to surgical area, do not take them off. They fall off   on their own. °· Check the surgery area daily for puffiness (swelling), redness, or leaking fluid. °· If surgery cuts were made in your legs: °· Avoid crossing your legs. °· Avoid sitting for long periods of time. Change positions every 30 minutes. °· Raise your legs  when you are sitting. Place them on pillows. °· Wear stockings that help keep blood clots from forming in your legs (compression stockings). °· Only take sponge baths until your doctor says it is okay to take showers. Pat the surgery area dry. Do not rub the surgery area with a washcloth or towel. Do not bathe, swim, or use a hot tub until your doctor says it is okay. °· Eat foods that are high in fiber. These include raw fruits and vegetables, whole grains, beans, and nuts. Choose lean meats. Avoid canned, processed, and fried foods. °· Drink enough fluids to keep your pee (urine) clear or pale yellow. °· Weigh yourself every day. °· Rest and limit activity as told by your doctor. You may be told to: °· Stop any activity if you have chest pain, shortness of breath, changes in heartbeat, or dizziness. Get help right away if this happens. °· Move around often for short amounts of time or take short walks as told by your doctor. Gradually become more active. You may need help to strengthen your muscles and build endurance. °· Avoid lifting, pushing, or pulling anything heavier than 10 pounds (4.5 kg) for at least 6 weeks after surgery. °· Do not drive until your doctor says it is okay. °· Ask your doctor when you can go back to work. °· Ask your doctor when you can begin sexual activity again. °· Follow up with your doctor as told. °GET HELP IF: °· You have puffiness, redness, more pain, or fluid draining from the incision site. °· You have a fever. °· You have puffiness in your ankles or legs. °· You have pain in your legs. °· You gain 2 or more pounds (0.9 kg) a day. °· You feel sick to your stomach (nauseous) or throw up (vomit). °· You have watery poop (diarrhea). °GET HELP RIGHT AWAY IF: °· You have chest pain that goes to your jaw or arms. °· You have shortness of breath. °· You have a fast or irregular heartbeat. °· You notice a "clicking" in your breastbone when you move. °· You have numbness or weakness in  your arms or legs. °· You feel dizzy or light-headed. °MAKE SURE YOU: °· Understand these instructions. °· Will watch your condition. °· Will get help right away if you are not doing well or get worse. °Document Released: 04/24/2013 Document Reviewed: 04/24/2013 °ExitCare® Patient Information ©2015 [employer]. This information is not intended to replace advice given to you by your health care provider. Make sure you discuss any questions you have with your health care provider. ° °

## 2014-02-21 NOTE — Progress Notes (Signed)
    SUBJECTIVE:  She feels OK and wants to go home.  No fever or chills   PHYSICAL EXAM Filed Vitals:   02/20/14 1532 02/20/14 1600 02/20/14 2036 02/21/14 0534  BP: 103/56 118/59 127/59 131/65  Pulse: 76 79 80 79  Temp:  99.1 F (37.3 C) 99.7 F (37.6 C) 98.5 F (36.9 C)  TempSrc:  Oral Oral Oral  Resp:  20 21 19   Height:      Weight:    190 lb 1.6 oz (86.229 kg)  SpO2: 98% 98% 95% 95%   General:  No distress Lungs:  Few basilar crackles Heart:  RRR, no rub Abdomen:  Positive bowel sounds, no rebound no guarding Extremities:  No edema   LABS:  Results for orders placed during the hospital encounter of 02/10/14 (from the past 24 hour(s))  GLUCOSE, CAPILLARY     Status: Abnormal   Collection Time    02/20/14 11:05 AM      Result Value Ref Range   Glucose-Capillary 126 (*) 70 - 99 mg/dL  GLUCOSE, CAPILLARY     Status: Abnormal   Collection Time    02/20/14  4:36 PM      Result Value Ref Range   Glucose-Capillary 107 (*) 70 - 99 mg/dL  GLUCOSE, CAPILLARY     Status: Abnormal   Collection Time    02/20/14  9:18 PM      Result Value Ref Range   Glucose-Capillary 106 (*) 70 - 99 mg/dL   Comment 1 Documented in Chart     Comment 2 Notify RN    GLUCOSE, CAPILLARY     Status: None   Collection Time    02/21/14  6:25 AM      Result Value Ref Range   Glucose-Capillary 91  70 - 99 mg/dL   Comment 1 Documented in Chart     Comment 2 Notify RN      Intake/Output Summary (Last 24 hours) at 02/21/14 0912 Last data filed at 02/21/14 0900  Gross per 24 hour  Intake    480 ml  Output   1050 ml  Net   -570 ml     ASSESSMENT AND PLAN:  Hypotension:  This looks like it is resolved and she can stop the midodrine.    CAD:  Discharge today.  She has follow up in our office and can follow with Dr. Anne FuSkains long term.      Fayrene FearingJames [facility] 02/21/2014 9:12 AM

## 2014-02-21 NOTE — Progress Notes (Signed)
Pt assessment unchanged.  Pt given verbal and written discharge instructions as well as written prescriptions.  Pt accompanied by daughter and sister at time of discharge.  Pt and family stated and demonstrated that they understand discharge instructions, incision care, and sternal precautions.  They plan to fill prescriptions after leaving the hospital today.  Pt has all personal belongings.  Pt transported downstairs via wheelchair with hospital volunteer to vehicle at the main entrance.    Johnson,Aspyn Warnke A 02/21/2014

## 2014-02-21 NOTE — Progress Notes (Addendum)
[address]       [place] [id number]             [phone]      6 Days Post-Op Procedure(s) (LRB): CORONARY ARTERY BYPASS GRAFTING (CABG) x three,  using left internal mammary artery and right leg greater saphenous vein harvested endoscopically (N/A) TRANSESOPHAGEAL ECHOCARDIOGRAM (TEE) (N/A) Subjective: Feels well  Objective: Vital signs in last 24 hours: Temp:  [98.3 F (36.8 C)-99.7 F (37.6 C)] 98.5 F (36.9 C) (10/22 0534) Pulse Rate:  [67-80] 79 (10/22 0534) Cardiac Rhythm:  [-]  Resp:  [18-21] 19 (10/22 0534) BP: (103-131)/(49-65) 131/65 mmHg (10/22 0534) SpO2:  [95 %-98 %] 95 % (10/22 0534) Weight:  [190 lb 1.6 oz (86.229 kg)] 190 lb 1.6 oz (86.229 kg) (10/22 0534)  Hemodynamic parameters for last 24 hours:    Intake/Output from previous day: 10/21 0701 - 10/22 0700 In: 240 [P.O.:240] Out: 1050 [Urine:1050] Intake/Output this shift:    General appearance: alert, cooperative and no distress Heart: regular rate and rhythm Lungs: clear to auscultation bilaterally Abdomen: benign Extremities: no edema Wound: incis healing well  Lab Results: No results found for this basename: WBC, HGB, HCT, PLT,  in the last 72 hours BMET: No results found for this basename: NA, K, CL, CO2, GLUCOSE, BUN, CREATININE, CALCIUM,  in the last 72 hours  PT/INR: No results found for this basename: LABPROT, INR,  in the last 72 hours ABG    Component Value Date/Time   PHART 7.344* 02/15/2014 1951   HCO3 21.5 02/15/2014 1951   TCO2 21 02/16/2014 1707   ACIDBASEDEF 4.0* 02/15/2014 1951   O2SAT 94.0 02/15/2014 1951   CBG (last 3)   Recent Labs  02/20/14 1636 02/20/14 2118 02/21/14 0625  GLUCAP 107* 106* 91    Meds Scheduled Meds: . aspirin EC  325 mg Oral Daily  . atorvastatin  40 mg Oral Daily  . docusate sodium  200 mg Oral Daily  . enoxaparin (LOVENOX) injection  40 mg Subcutaneous Q24H  . furosemide  40 mg Oral Daily  . insulin aspart   0-24 Units Subcutaneous TID AC & HS  . levothyroxine  100 mcg Oral QAC breakfast  . midodrine  5 mg Oral BID WC  . moving right along book   Does not apply Once  . pantoprazole  40 mg Oral QAC breakfast  . potassium chloride  40 mEq Oral Daily  . sodium chloride  3 mL Intravenous Q12H   Continuous Infusions:  PRN Meds:.sodium chloride, acetaminophen, ondansetron (ZOFRAN) IV, ondansetron, oxyCODONE, sodium chloride, traMADol  Xrays Dg Chest 2 View  02/20/2014   CLINICAL DATA:  Weakness, chest soreness, cardiac surgery  EXAM: CHEST  2 VIEW  COMPARISON:  Chest x-ray of 02/18/2014  FINDINGS: There is little change in moderate cardiomegaly with bilateral pleural effusions and basilar atelectasis. Median sternotomy sutures are noted from CABG. No bony abnormality is seen.  IMPRESSION: Little change in moderate cardiomegaly with pleural effusions and basilar atelectasis.   Electronically Signed   By: Dwyane DeePaul  Barry M.D.   On: 02/20/2014 08:28    Assessment/Plan: S/P Procedure(s) (LRB): CORONARY ARTERY BYPASS GRAFTING (CABG) x three,  using left internal mammary artery and right leg greater saphenous vein harvested endoscopically (N/A) TRANSESOPHAGEAL ECHOCARDIOGRAM (TEE) (N/A)  1 conts to do well , appears stable for d/c . Will discuss midodrine with MD as may not need .      LOS: 11 days  [name],[name] 02/21/2014   Chart reviewed, patient examined, agree with above. He blood pressure has been stable and is 131/65 this am. Will stop Midodrine. She feels well and is walking to the bathroom on her own, off oxygen. Will discharge today.

## 2014-02-21 NOTE — Progress Notes (Signed)
CARDIAC REHAB PHASE I   Ed completed with pt and daughters. Voiced understanding and requests her name be sent to [facility]. They have watched d/c video. [id number]  [name] LovettReeve, [name] [name] [name] PlatteKristan CES, ACSM 02/21/2014 9:53 AM

## 2014-02-28 ENCOUNTER — Ambulatory Visit (INDEPENDENT_AMBULATORY_CARE_PROVIDER_SITE_OTHER): Payer: Self-pay

## 2014-02-28 DIAGNOSIS — I2510 Atherosclerotic heart disease of native coronary artery without angina pectoris: Secondary | ICD-10-CM

## 2014-02-28 DIAGNOSIS — Z4802 Encounter for removal of sutures: Secondary | ICD-10-CM

## 2014-02-28 NOTE — Progress Notes (Signed)
Pt was recieved in office to have sutures removed.Pt's site looks to be healing well. No draining,Mild pain. Pt tolerated removal well.

## 2014-03-01 ENCOUNTER — Ambulatory Visit: Payer: Medicare PPO

## 2014-03-12 ENCOUNTER — Encounter: Payer: Self-pay | Admitting: *Deleted

## 2014-03-14 ENCOUNTER — Telehealth (HOSPITAL_COMMUNITY): Payer: Self-pay | Admitting: *Deleted

## 2014-03-14 ENCOUNTER — Encounter: Payer: Self-pay | Admitting: Physician Assistant

## 2014-03-14 ENCOUNTER — Ambulatory Visit (INDEPENDENT_AMBULATORY_CARE_PROVIDER_SITE_OTHER): Payer: Medicare PPO | Admitting: Physician Assistant

## 2014-03-14 VITALS — BP 130/58 | HR 73 | Ht 60.0 in | Wt 185.0 lb

## 2014-03-14 DIAGNOSIS — I6529 Occlusion and stenosis of unspecified carotid artery: Secondary | ICD-10-CM | POA: Insufficient documentation

## 2014-03-14 DIAGNOSIS — I2510 Atherosclerotic heart disease of native coronary artery without angina pectoris: Secondary | ICD-10-CM

## 2014-03-14 DIAGNOSIS — I255 Ischemic cardiomyopathy: Secondary | ICD-10-CM | POA: Insufficient documentation

## 2014-03-14 DIAGNOSIS — I10 Essential (primary) hypertension: Secondary | ICD-10-CM | POA: Insufficient documentation

## 2014-03-14 DIAGNOSIS — I200 Unstable angina: Secondary | ICD-10-CM

## 2014-03-14 DIAGNOSIS — E785 Hyperlipidemia, unspecified: Secondary | ICD-10-CM

## 2014-03-14 DIAGNOSIS — I6523 Occlusion and stenosis of bilateral carotid arteries: Secondary | ICD-10-CM

## 2014-03-14 MED ORDER — METOPROLOL SUCCINATE ER 25 MG PO TB24: 12.5000 mg | ORAL_TABLET | Freq: Every day | ORAL | Status: DC

## 2014-03-14 NOTE — Patient Instructions (Addendum)
LAB WORK IN 6-8 WEEKS FOR FASTING LIPID AND LIVER PANEL  Your physician recommends that you schedule a follow-up appointment in: 8 WEEKS WITH DR. Anne FuSKAINS  You have been referred to CARDIAC REHAB AT [facility]

## 2014-03-14 NOTE — Telephone Encounter (Signed)
Unable to leave message on contact phone number.  Please contact letter sent to pt's address listed in Epic. Alanson Alyarlette Carlton RN, BSN

## 2014-03-14 NOTE — Progress Notes (Signed)
Cardiology Office Note   Date:  03/14/2014   ID:  [name] Connerstrulia Mccart, DOB 09/04/1932, MRN [id number]  PCP:  Alva GarnetSHELTON,KIMBERLY R., MD  Cardiologist:  Dr. Donato SchultzMark [name]     History of Present Illness: [name] [name] is a 80 y.o. female with a hx of HTN, HLD, hypothyroidism.  She was admitted 10/11-10/22 with NSTEMI.  LHC demonstrated critical LAD stenosis and reduced LVF with EF 35%.  FU Echo demonstrated EF 50-55% and Gr 1 diastolic dysfunction.  She was referred for CABG.  She underwent CABG with Dr. Laneta SimmersBartle (L-LAD, S-D1, S-D2).  Post op course was c/b hypotension and volume excess.  She remained in NSR.  She returns for FU.    She is here with her daughter.  She is, overall, doing very well.  She notes very little chest soreness. She only takes Tylenol prn.  She denies significant dyspnea. She denies orthopnea, PND, edema. She denies syncope. She denies fever, cough.  She is eating well.  She is walking about 20 minutes a day.  She is interested in pursuing cardiac rehab.    Studies:  - LHC (02/11/14):  pLAD 95%, mLAD occl, CFX < 20%, pRCA 30%, EF 35%, ant and apical AK  - Echo (10/15):  EF 50% to 55%. Wall motion was normal; Grade 1 diastolic dysfunction.  Mild AI.  Ascending aorta mildly dilated. Mild MR.  Mild LAE.  PA peak pressure: 46 mm Hg (S).  - Carotid US (10/15):  Bilateral ICA 1-39%   Recent Labs/Images:  02/11/2014: LDL (calc) 78; TSH 2.530 02/18/2014: BUN 13; Creatinine 0.81; Hemoglobin 9.2*; Potassium 3.8; Sodium 139   Dg Chest 2 View   02/10/2014    IMPRESSION: Cardiomegaly with tortuosity/ectasia of the thoracic aorta without acute cardiopulmonary disease.   Electronically Signed   By: Simonne ComeJohn  Watts M.D.   On: 02/10/2014 20:43     Wt Readings from Last 3 Encounters:  03/14/14 185 lb (83.915 kg)  02/21/14 190 lb 1.6 oz (86.229 kg)     Past Medical History  Diagnosis Date  . Hypertension   . HLD (hyperlipidemia)   . Coronary artery disease     a. NSTEMI >> LHC (02/11/14):   pLAD 95%, mLAD occl, CFX < 20%, pRCA 30%, EF 35%, ant and apical AK >> CABG  . Ischemic cardiomyopathy     a. EF 35% by cath at time of NSTEMI >> b. Echo (10/15):  EF 50% to 55%. Wall motion was normal; Grade 1 diastolic dysfunction.  Mild AI.  Ascending aorta mildly dilated. Mild MR.  Mild LAE.  PA peak pressure: 46 mm Hg (S).  . Carotid stenosis     a. Carotid US (10/15):  Bilateral ICA 1-39%  . Glucose intolerance (impaired glucose tolerance)     a. A1c 6.1 (01/2014)  . H/O non-ST elevation myocardial infarction (NSTEMI)     01/2014    Current Outpatient Prescriptions  Medication Sig Dispense Refill  . aspirin EC 325 MG EC tablet Take 1 tablet (325 mg total) by mouth daily.    Marland Kitchen. atorvastatin (LIPITOR) 40 MG tablet Take 1 tablet (40 mg total) by mouth daily. 30 tablet 1  . Calcium Citrate (CITRACAL PO) Take 1 tablet by mouth daily.    . Cholecalciferol (VITAMIN D3) 2000 UNITS TABS Take 2,000 Units by mouth daily.    Marland Kitchen. levothyroxine (SYNTHROID, LEVOTHROID) 100 MCG tablet Take 100 mcg by mouth daily.     No current facility-administered medications for this visit.  Allergies:   Review of patient's allergies indicates no known allergies.   Social History:  The patient  reports that she has never smoked. She does not have any smokeless tobacco history on file. She reports that she does not drink alcohol or use illicit drugs.   Family History:  The patient's family history includes Diabetes in her brother and sister; Hypertension in her mother and sister; Stroke in her mother. There is no history of Heart attack.   ROS:  Please see the history of present illness.       All other systems reviewed and negative.    PHYSICAL EXAM: VS:  BP 130/58 mmHg  Pulse 73  Ht 5' (1.524 m)  Wt 185 lb (83.915 kg)  BMI 36.13 kg/m2 Well nourished, well developed, in no acute distress HEENT: normal Neck:  no JVD Cardiac:  normal S1, S2;  RRR; no murmur Chest:  Median sternotomy well healed, no  erythema or discharge  Lungs:   clear to auscultation bilaterally, no wheezing, rhonchi or rales Abd: soft, nontender, no hepatomegaly Ext:  no edema Skin: warm and dry Neuro:  CNs 2-12 intact, no focal abnormalities noted  EKG:  NSR, HR 73, LAD, anterolateral TWI, no change from prior tracing.       ASSESSMENT AND PLAN:  1.  Coronary artery disease:  She is progressing well after recent CABG x 3 following presentation with a NSTEMI.  She sees Dr. Laneta SimmersBartle next week.    -  Continue ASA, statin.    -  Add Toprol-XL 12.5 mg QD.    -  Refer to cardiac rehab.  2.  Essential hypertension:  Controlled.  3.  Ischemic cardiomyopathy:  EF improved by echo to normal.  Add beta blocker as noted.  If BP increases, consider adding ACEI. 4.  HLD (hyperlipidemia):  Continue statin.     - Plan: Lipid panel, Hepatic function panel in 6 weeks.  5.  Carotid stenosis, bilateral:  Mild plaque pre-CABG.  Consider repeat US in 1 year.   Disposition:   FU with Dr. Donato SchultzMark [name] 8 weeks.    Signed, Brynda RimScott Elmer Merwin, PA-C, MHS 03/14/2014 12:19 PM    [facility] [address], [place], [place]  [address] Phone: [phone]; Fax: [phone]

## 2014-03-26 ENCOUNTER — Other Ambulatory Visit: Payer: Self-pay | Admitting: Surgery

## 2014-03-26 DIAGNOSIS — Z951 Presence of aortocoronary bypass graft: Secondary | ICD-10-CM

## 2014-03-27 ENCOUNTER — Ambulatory Visit: Payer: Self-pay | Admitting: Surgery

## 2014-04-03 ENCOUNTER — Ambulatory Visit
Admission: RE | Admit: 2014-04-03 | Discharge: 2014-04-03 | Disposition: A | Discharge status: Home / Self Care | Payer: Medicare PPO | Source: Ambulatory Visit | Attending: Surgery | Admitting: Surgery

## 2014-04-03 ENCOUNTER — Ambulatory Visit (INDEPENDENT_AMBULATORY_CARE_PROVIDER_SITE_OTHER): Payer: Self-pay | Admitting: Surgery

## 2014-04-03 ENCOUNTER — Encounter: Payer: Self-pay | Admitting: Surgery

## 2014-04-03 VITALS — BP 178/86 | HR 65 | Resp 20 | Ht 60.0 in | Wt 185.0 lb

## 2014-04-03 DIAGNOSIS — Z951 Presence of aortocoronary bypass graft: Secondary | ICD-10-CM

## 2014-04-04 ENCOUNTER — Ambulatory Visit (HOSPITAL_COMMUNITY): Payer: Medicare PPO

## 2014-04-05 ENCOUNTER — Encounter: Payer: Self-pay | Admitting: Surgery

## 2014-04-05 NOTE — Progress Notes (Signed)
      HPI:  Patient returns for routine postoperative follow-up having undergone CABG x 3 on 02/15/2014. The patient's early postoperative recovery while in the hospital was notable for an uncomplicated postop course. Since hospital discharge the patient reports that she has been feeling well. She is ambulating without chest pain or shortness of breath.   Current Outpatient Prescriptions  Medication Sig Dispense Refill  . aspirin EC 325 MG EC tablet Take 1 tablet (325 mg total) by mouth daily.    Marland Kitchen. atorvastatin (LIPITOR) 40 MG tablet Take 1 tablet (40 mg total) by mouth daily. 30 tablet 1  . Calcium Citrate (CITRACAL PO) Take 1 tablet by mouth daily.    . Cholecalciferol (VITAMIN D3) 2000 UNITS TABS Take 2,000 Units by mouth daily.    Marland Kitchen. levothyroxine (SYNTHROID, LEVOTHROID) 100 MCG tablet Take 100 mcg by mouth daily.    . metoprolol succinate (TOPROL XL) 25 MG 24 hr tablet Take 0.5 tablets (12.5 mg total) by mouth daily. 15 tablet 11   No current facility-administered medications for this visit.    Physical Exam: BP 178/86 mmHg  Pulse 65  Resp 20  Ht 5' (1.524 m)  Wt 185 lb (83.915 kg)  BMI 36.13 kg/m2  SpO2 98% She looks well. Lung exam is clear. Cardiac exam shows a regular rate and rhythm with normal heart sounds. Chest incision is healing well and sternum is stable. The leg incisions are healing well and there is no peripheral edema.    Diagnostic Tests:  CLINICAL DATA: History of previous MI and October 2015 status post CABG in on February 15, 2014  EXAM: CHEST 2 VIEW  COMPARISON: PA and lateral chest of February 20, 2014  FINDINGS: The lungs are adequately inflated. There is no focal infiltrate. Minimal linear scarring persists in the left mid lung. The bilateral pleural effusions have resolved. The cardiac silhouette has decreased further in size. The pulmonary vascularity is not engorged. There is tortuosity of the descending thoracic aorta. There  are 6 intact sternal wires. There is mild degenerative disc space narrowing of the mid thoracic spine.  IMPRESSION: There is no evidence of pulmonary edema, pleural effusion nor other acute abnormality. There is mild cardiomegaly which has improved since the previous study.   Electronically Signed  By: David SwazilandJordan  On: 04/03/2014 15:50   Impression:  Overall I think she is doing well. I encouraged her to continue walking. She is planning to participate in cardiac rehab. I told her not lift anything heavier than 10 lbs for three months postop.   Plan:  She is going to follow up with Dr. Anne FuSkains and Dr. Renae GlossShelton and will contact me if she develops any problems with her incisions.

## 2014-04-10 ENCOUNTER — Ambulatory Visit (HOSPITAL_COMMUNITY): Payer: Medicare PPO

## 2014-04-11 ENCOUNTER — Encounter (HOSPITAL_COMMUNITY): Payer: Self-pay | Admitting: Cardiology

## 2014-04-12 ENCOUNTER — Ambulatory Visit (HOSPITAL_COMMUNITY): Payer: Medicare PPO

## 2014-04-17 ENCOUNTER — Ambulatory Visit (HOSPITAL_COMMUNITY): Payer: Medicare PPO

## 2014-04-19 ENCOUNTER — Ambulatory Visit (HOSPITAL_COMMUNITY): Payer: Medicare PPO

## 2014-04-23 ENCOUNTER — Other Ambulatory Visit: Payer: Self-pay | Admitting: *Deleted

## 2014-04-23 MED ORDER — ATORVASTATIN CALCIUM 40 MG PO TABS: 40.0000 mg | ORAL_TABLET | Freq: Every day | ORAL | Status: DC

## 2014-04-24 ENCOUNTER — Ambulatory Visit (HOSPITAL_COMMUNITY): Payer: Medicare PPO

## 2014-04-29 ENCOUNTER — Other Ambulatory Visit: Payer: Medicare PPO

## 2014-05-01 ENCOUNTER — Ambulatory Visit (HOSPITAL_COMMUNITY): Payer: Medicare PPO

## 2014-05-01 ENCOUNTER — Other Ambulatory Visit (INDEPENDENT_AMBULATORY_CARE_PROVIDER_SITE_OTHER): Payer: Medicare PPO | Admitting: *Deleted

## 2014-05-01 DIAGNOSIS — E785 Hyperlipidemia, unspecified: Secondary | ICD-10-CM

## 2014-05-01 LAB — HEPATIC FUNCTION PANEL
ALBUMIN: 4 g/dL (ref 3.5–5.2)
ALT: 10 U/L (ref 0–35)
AST: 13 U/L (ref 0–37)
Alkaline Phosphatase: 46 U/L (ref 39–117)
Bilirubin, Direct: 0 mg/dL (ref 0.0–0.3)
Total Bilirubin: 0.5 mg/dL (ref 0.2–1.2)
Total Protein: 7.5 g/dL (ref 6.0–8.3)

## 2014-05-01 LAB — LIPID PANEL
CHOLESTEROL: 125 mg/dL (ref 0–200)
HDL: 47.6 mg/dL (ref >39.00)
LDL Cholesterol: 65 mg/dL (ref 0–99)
NonHDL: 77.4
TRIGLYCERIDES: 61 mg/dL (ref 0.0–149.0)
Total CHOL/HDL Ratio: 3
VLDL: 12.2 mg/dL (ref 0.0–40.0)

## 2014-05-02 ENCOUNTER — Telehealth: Payer: Self-pay | Admitting: *Deleted

## 2014-05-02 NOTE — Telephone Encounter (Signed)
Pt's daughter cb and was wanting lab results. I had said I did not see a DPR on file giving permission to s/w anyone. Daughter said hold on and she put pt on the phone. Pt was then notified of results with verbal understanding.

## 2014-05-08 ENCOUNTER — Ambulatory Visit (HOSPITAL_COMMUNITY): Payer: Medicare PPO

## 2014-05-10 ENCOUNTER — Ambulatory Visit (HOSPITAL_COMMUNITY): Payer: Medicare PPO

## 2014-05-10 ENCOUNTER — Encounter: Payer: Self-pay | Admitting: Cardiology

## 2014-05-10 ENCOUNTER — Ambulatory Visit (INDEPENDENT_AMBULATORY_CARE_PROVIDER_SITE_OTHER): Payer: Medicare PPO | Admitting: Cardiology

## 2014-05-10 VITALS — BP 138/82 | HR 61 | Ht 60.0 in | Wt 181.0 lb

## 2014-05-10 DIAGNOSIS — I10 Essential (primary) hypertension: Secondary | ICD-10-CM

## 2014-05-10 DIAGNOSIS — I6523 Occlusion and stenosis of bilateral carotid arteries: Secondary | ICD-10-CM

## 2014-05-10 DIAGNOSIS — I2583 Coronary atherosclerosis due to lipid rich plaque: Secondary | ICD-10-CM

## 2014-05-10 DIAGNOSIS — E785 Hyperlipidemia, unspecified: Secondary | ICD-10-CM

## 2014-05-10 DIAGNOSIS — I2510 Atherosclerotic heart disease of native coronary artery without angina pectoris: Secondary | ICD-10-CM

## 2014-05-10 DIAGNOSIS — Z951 Presence of aortocoronary bypass graft: Secondary | ICD-10-CM

## 2014-05-10 NOTE — Patient Instructions (Addendum)
The current medical regimen is effective;  continue present plan and medications.  Follow up in 4 months with Dr. Skains.  You will receive a letter in the mail 2 months before you are due.  Please call us when you receive this letter to schedule your follow up appointment.  Thank you for choosing [facility]!!     

## 2014-05-10 NOTE — Progress Notes (Signed)
Cardiology Office Note   Date:  05/10/2014   ID:  [name] Connerstrulia Tamas, DOB 1932-05-07, MRN [id number]  PCP:  Alva GarnetSHELTON,KIMBERLY R., MD  Cardiologist:  Dr. Donato SchultzMark Jerrell Hart     History of Present Illness: [name] [name] is a 81 y.o. female with a hx of HTN, HLD, hypothyroidism.  She was admitted 10/11-10/22 with NSTEMI.  LHC demonstrated critical LAD stenosis and reduced LVF with EF 35%.  FU Echo demonstrated EF 50-55% and Gr 1 diastolic dysfunction.  She was referred for CABG.  She underwent CABG with Dr. Laneta SimmersBartle (L-LAD, S-D1, S-D2).  Post op course was c/b hypotension and volume excess.  She remained in NSR.  She returns for FU.    She is, overall, doing very well.  She notes very little chest soreness. She only takes Tylenol prn.  She denies significant dyspnea. She denies orthopnea, PND, edema. She denies syncope. She denies fever, cough.  She is eating well.  She is walking about 20 minutes a day.  She was interested in pursuing cardiac rehab but too expensive. Now she is doing the treadmill at home. Slowly.   Studies:  - LHC (02/11/14):  pLAD 95%, mLAD occl, CFX < 20%, pRCA 30%, EF 35%, ant and apical AK  - Echo (10/15):  EF 50% to 55%. Wall motion was normal; Grade 1 diastolic dysfunction.  Mild AI.  Ascending aorta mildly dilated. Mild MR.  Mild LAE.  PA peak pressure: 46 mm Hg (S).  - Carotid US (10/15):  Bilateral ICA 1-39%   Recent Labs/Images:  02/11/2014: TSH 2.530 02/18/2014: BUN 13; Creatinine 0.81; Hemoglobin 9.2*; Potassium 3.8; Sodium 139 05/01/2014: ALT 10; LDL (calc) 65   Dg Chest 2 View   02/10/2014    IMPRESSION: Cardiomegaly with tortuosity/ectasia of the thoracic aorta without acute cardiopulmonary disease.   Electronically Signed   By: Simonne ComeJohn  Watts M.D.   On: 02/10/2014 20:43     Wt Readings from Last 3 Encounters:  05/10/14 181 lb (82.101 kg)  04/03/14 185 lb (83.915 kg)  03/14/14 185 lb (83.915 kg)     Past Medical History  Diagnosis Date  . Hypertension   . HLD  (hyperlipidemia)   . Coronary artery disease     a. NSTEMI >> LHC (02/11/14):  pLAD 95%, mLAD occl, CFX < 20%, pRCA 30%, EF 35%, ant and apical AK >> CABG  . Ischemic cardiomyopathy     a. EF 35% by cath at time of NSTEMI >> b. Echo (10/15):  EF 50% to 55%. Wall motion was normal; Grade 1 diastolic dysfunction.  Mild AI.  Ascending aorta mildly dilated. Mild MR.  Mild LAE.  PA peak pressure: 46 mm Hg (S).  . Carotid stenosis     a. Carotid US (10/15):  Bilateral ICA 1-39%  . Glucose intolerance (impaired glucose tolerance)     a. A1c 6.1 (01/2014)  . H/O non-ST elevation myocardial infarction (NSTEMI)     01/2014    Current Outpatient Prescriptions  Medication Sig Dispense Refill  . aspirin EC 325 MG EC tablet Take 1 tablet (325 mg total) by mouth daily.    Marland Kitchen. atorvastatin (LIPITOR) 40 MG tablet Take 1 tablet (40 mg total) by mouth daily. 30 tablet 1  . Calcium Citrate (CITRACAL PO) Take 1 tablet by mouth daily.    . Calcium-Magnesium-Vitamin D 600-40-500 MG-MG-UNIT TB24 Take 1,200 mg by mouth daily.    . Cholecalciferol (VITAMIN D3) 2000 UNITS TABS Take 2,000 Units by mouth daily.    .Marland Kitchen  levothyroxine (SYNTHROID, LEVOTHROID) 100 MCG tablet Take 100 mcg by mouth daily.    . metoprolol succinate (TOPROL XL) 25 MG 24 hr tablet Take 0.5 tablets (12.5 mg total) by mouth daily. 15 tablet 11   No current facility-administered medications for this visit.     Allergies:   Review of patient's allergies indicates no known allergies.   Social History:  The patient  reports that she has never smoked. She does not have any smokeless tobacco history on file. She reports that she does not drink alcohol or use illicit drugs.   Family History:  The patient's family history includes Diabetes in her brother and sister; Hypertension in her mother and sister; Stroke in her mother. There is no history of Heart attack.   ROS:  Please see the history of present illness. Still having some mild chest scar pain.  Mild keloid. All other systems reviewed and negative.    PHYSICAL EXAM: VS:  BP 138/82 mmHg  Pulse 61  Ht 5' (1.524 m)  Wt 181 lb (82.101 kg)  BMI 35.35 kg/m2 Well nourished, well developed, in no acute distress HEENT: normal Neck:  no JVD Cardiac:  normal S1, S2;  RRR; no murmur Chest:  Median sternotomy well healed, mild keloid  Lungs:   clear to auscultation bilaterally, no wheezing, rhonchi or rales Abd: soft, nontender, no hepatomegaly Ext:  no edema Skin: warm and dry Neuro:  CNs 2-12 intact, no focal abnormalities noted  EKG:  Prior EKG NSR, HR 73, LAD, anterolateral TWI, no change from prior tracing.         ASSESSMENT AND PLAN:  1.  Coronary artery disease:  She is progressing well after recent CABG x 3 following presentation with a NSTEMI.  [name]     -  Continue ASA, statin.    -  Add Toprol-XL 12.5 mg QD.    -  Cardiac rehab.  2.  Essential hypertension:  Controlled.  3.  Ischemic cardiomyopathy:  EF improved by echo to normal.  Add beta blocker as noted.  If BP increases, consider adding ACEI. 4.  HLD (hyperlipidemia):  Continue statin.     - Lipid panel - 05/01/14- LDL 65, ALT 10 5.  Carotid stenosis, bilateral:  Mild plaque pre-CABG.  Consider repeat US in 1 year.   Disposition:      Mathews Robinsons, MD, [facility]  05/10/2014 1:41 PM    [facility] [address] [place], [place], [place]  [address] Phone: [phone]; Fax: [phone]

## 2014-05-15 ENCOUNTER — Ambulatory Visit (HOSPITAL_COMMUNITY): Payer: Medicare PPO

## 2014-05-17 ENCOUNTER — Ambulatory Visit (HOSPITAL_COMMUNITY): Payer: Medicare PPO

## 2014-05-22 ENCOUNTER — Ambulatory Visit (HOSPITAL_COMMUNITY): Payer: Medicare PPO

## 2014-05-24 ENCOUNTER — Ambulatory Visit (HOSPITAL_COMMUNITY): Payer: Medicare PPO

## 2014-05-29 ENCOUNTER — Ambulatory Visit (HOSPITAL_COMMUNITY): Payer: Medicare PPO

## 2014-05-31 ENCOUNTER — Ambulatory Visit (HOSPITAL_COMMUNITY): Payer: Medicare PPO

## 2014-06-05 ENCOUNTER — Ambulatory Visit (HOSPITAL_COMMUNITY): Payer: Medicare PPO

## 2014-06-07 ENCOUNTER — Ambulatory Visit (HOSPITAL_COMMUNITY): Payer: Medicare PPO

## 2014-06-12 ENCOUNTER — Ambulatory Visit (HOSPITAL_COMMUNITY): Payer: Medicare PPO

## 2014-06-14 ENCOUNTER — Ambulatory Visit (HOSPITAL_COMMUNITY): Payer: Medicare PPO

## 2014-06-16 ENCOUNTER — Other Ambulatory Visit: Payer: Self-pay | Admitting: Cardiology

## 2014-06-17 ENCOUNTER — Other Ambulatory Visit (HOSPITAL_COMMUNITY): Payer: Self-pay | Admitting: Internal Medicine

## 2014-06-17 DIAGNOSIS — Z1231 Encounter for screening mammogram for malignant neoplasm of breast: Secondary | ICD-10-CM

## 2014-06-17 NOTE — Telephone Encounter (Signed)
PATIENT NEEDS APPT FOR REFILLS AND LABS

## 2014-06-19 ENCOUNTER — Ambulatory Visit (HOSPITAL_COMMUNITY): Payer: Medicare PPO

## 2014-06-21 ENCOUNTER — Ambulatory Visit (HOSPITAL_COMMUNITY): Payer: Medicare PPO

## 2014-06-26 ENCOUNTER — Ambulatory Visit (HOSPITAL_COMMUNITY)
Admission: RE | Admit: 2014-06-26 | Discharge: 2014-06-26 | Disposition: A | Discharge status: Home / Self Care | Payer: Medicare PPO | Source: Ambulatory Visit | Attending: Internal Medicine | Admitting: Internal Medicine

## 2014-06-26 ENCOUNTER — Ambulatory Visit (HOSPITAL_COMMUNITY): Payer: Medicare PPO

## 2014-06-26 DIAGNOSIS — Z1231 Encounter for screening mammogram for malignant neoplasm of breast: Secondary | ICD-10-CM | POA: Diagnosis not present

## 2014-06-28 ENCOUNTER — Ambulatory Visit (HOSPITAL_COMMUNITY): Payer: Medicare PPO

## 2014-07-03 ENCOUNTER — Ambulatory Visit (HOSPITAL_COMMUNITY): Payer: Medicare PPO

## 2014-07-05 ENCOUNTER — Ambulatory Visit (HOSPITAL_COMMUNITY): Payer: Medicare PPO

## 2014-07-10 ENCOUNTER — Ambulatory Visit (HOSPITAL_COMMUNITY): Payer: Medicare PPO

## 2014-07-12 ENCOUNTER — Ambulatory Visit (HOSPITAL_COMMUNITY): Payer: Medicare PPO

## 2014-07-17 ENCOUNTER — Ambulatory Visit (HOSPITAL_COMMUNITY): Payer: Medicare PPO

## 2014-07-19 ENCOUNTER — Ambulatory Visit (HOSPITAL_COMMUNITY): Payer: Medicare PPO

## 2014-07-24 ENCOUNTER — Ambulatory Visit (HOSPITAL_COMMUNITY): Payer: Medicare PPO

## 2014-07-26 ENCOUNTER — Ambulatory Visit (HOSPITAL_COMMUNITY): Payer: Medicare PPO

## 2014-07-27 ENCOUNTER — Encounter (HOSPITAL_COMMUNITY): Payer: Self-pay | Admitting: Emergency Medicine

## 2014-07-27 ENCOUNTER — Emergency Department (HOSPITAL_COMMUNITY)
Admission: EM | Admit: 2014-07-27 | Discharge: 2014-07-27 | Disposition: A | Discharge status: Home / Self Care | Payer: Medicare PPO | Attending: Emergency Medicine | Admitting: Emergency Medicine

## 2014-07-27 DIAGNOSIS — Z79899 Other long term (current) drug therapy: Secondary | ICD-10-CM | POA: Diagnosis not present

## 2014-07-27 DIAGNOSIS — I2510 Atherosclerotic heart disease of native coronary artery without angina pectoris: Secondary | ICD-10-CM | POA: Diagnosis not present

## 2014-07-27 DIAGNOSIS — Z951 Presence of aortocoronary bypass graft: Secondary | ICD-10-CM | POA: Diagnosis not present

## 2014-07-27 DIAGNOSIS — E785 Hyperlipidemia, unspecified: Secondary | ICD-10-CM | POA: Insufficient documentation

## 2014-07-27 DIAGNOSIS — I10 Essential (primary) hypertension: Secondary | ICD-10-CM | POA: Insufficient documentation

## 2014-07-27 DIAGNOSIS — M79602 Pain in left arm: Secondary | ICD-10-CM | POA: Diagnosis present

## 2014-07-27 DIAGNOSIS — Z7982 Long term (current) use of aspirin: Secondary | ICD-10-CM | POA: Insufficient documentation

## 2014-07-27 LAB — CBC
HCT: 37.1 % (ref 36.0–46.0)
Hemoglobin: 11.8 g/dL — ABNORMAL LOW (ref 12.0–15.0)
MCH: 26 pg (ref 26.0–34.0)
MCHC: 31.8 g/dL (ref 30.0–36.0)
MCV: 81.9 fL (ref 78.0–100.0)
PLATELETS: 224 10*3/uL (ref 150–400)
RBC: 4.53 MIL/uL (ref 3.87–5.11)
RDW: 19.1 % — ABNORMAL HIGH (ref 11.5–15.5)
WBC: 6 10*3/uL (ref 4.0–10.5)

## 2014-07-27 LAB — BASIC METABOLIC PANEL
ANION GAP: 5 (ref 5–15)
BUN: 10 mg/dL (ref 6–23)
CALCIUM: 10 mg/dL (ref 8.4–10.5)
CO2: 30 mmol/L (ref 19–32)
Chloride: 104 mmol/L (ref 96–112)
Creatinine, Ser: 1 mg/dL (ref 0.50–1.10)
GFR calc Af Amer: 60 mL/min — ABNORMAL LOW (ref >90)
GFR calc non Af Amer: 52 mL/min — ABNORMAL LOW (ref >90)
Glucose, Bld: 101 mg/dL — ABNORMAL HIGH (ref 70–99)
POTASSIUM: 3.8 mmol/L (ref 3.5–5.1)
SODIUM: 139 mmol/L (ref 135–145)

## 2014-07-27 LAB — I-STAT TROPONIN, ED
TROPONIN I, POC: 0.01 ng/mL (ref 0.00–0.08)
Troponin i, poc: 0.01 ng/mL (ref 0.00–0.08)

## 2014-07-27 NOTE — ED Notes (Signed)
MD at bedside. 

## 2014-07-27 NOTE — ED Notes (Addendum)
Pt c/o pain to left arm onset 1 hour PTA. Pt has history of MI in past with similar symptoms. Pt reports shortness of breath. Pt took extra strength tylenol and a regular aspirin.

## 2014-07-27 NOTE — Discharge Instructions (Signed)

## 2014-07-27 NOTE — ED Provider Notes (Signed)
CSN: [id number]     Arrival date & time 07/27/14  1856 History   First MD Initiated Contact with Patient 07/27/14 2109     Chief Complaint  Patient presents with  . Arm Pain     (Consider location/radiation/quality/duration/timing/severity/associated sxs/prior Treatment) HPI  81 year old female with past medical history of hypertension hyperlipidemia coronary disease, who comes in with this left arm "funny feeling". This feeling went on for approximately 2 hours earlier today and then went away. She can recall no injuries she doesn't describe it as a pain but a funny feeling. No chest pain shortness of breath. The patient is concerned because with her previous MI she had left arm pain as well as chest discomfort at that time. The arm discomfort this time feels different than with her previous MI.  Past Medical History  Diagnosis Date  . Hypertension   . HLD (hyperlipidemia)   . Coronary artery disease     a. NSTEMI >> LHC (02/11/14):  pLAD 95%, mLAD occl, CFX < 20%, pRCA 30%, EF 35%, ant and apical AK >> CABG  . Ischemic cardiomyopathy     a. EF 35% by cath at time of NSTEMI >> b. Echo (10/15):  EF 50% to 55%. Wall motion was normal; Grade 1 diastolic dysfunction.  Mild AI.  Ascending aorta mildly dilated. Mild MR.  Mild LAE.  PA peak pressure: 46 mm Hg (S).  . Carotid stenosis     a. Carotid US (10/15):  Bilateral ICA 1-39%  . Glucose intolerance (impaired glucose tolerance)     a. A1c 6.1 (01/2014)  . H/O non-ST elevation myocardial infarction (NSTEMI)     01/2014   Past Surgical History  Procedure Laterality Date  . Cesarean section    . Coronary artery bypass graft N/A 02/15/2014    Procedure: CORONARY ARTERY BYPASS GRAFTING (CABG) x three,  using left internal mammary artery and right leg greater saphenous vein harvested endoscopically;  Surgeon: Alleen Borne, MD;  Location: [facility] OR;  Service: Open Heart Surgery;  Laterality: N/A;  . Tee without cardioversion N/A 02/15/2014     Procedure: TRANSESOPHAGEAL ECHOCARDIOGRAM (TEE);  Surgeon: Alleen Borne, MD;  Location[facility] OR;  Service: Open Heart Surgery;  Laterality: N/A;  . Left heart catheterization with coronary angiogram N/A 02/11/2014    Procedure: LEFT HEART CATHETERIZATION WITH CORONARY ANGIOGRAM;  Surgeon: Peter M Swaziland, MD;  Location: [facility] CATH LAB;  Service: Cardiovascular;  Laterality: N/A;   Family History  Problem Relation Age of Onset  . Heart attack Neg Hx   . Stroke Mother   . Hypertension Mother   . Hypertension Sister   . Diabetes Brother   . Diabetes Sister    History  Substance Use Topics  . Smoking status: Never Smoker   . Smokeless tobacco: Not on file  . Alcohol Use: No   OB History    No data available     Review of Systems  Constitutional: Negative for fever and chills.  HENT: Negative for nosebleeds.   Eyes: Negative for visual disturbance.  Respiratory: Negative for cough and shortness of breath.   Cardiovascular: Negative for chest pain.  Gastrointestinal: Negative for nausea, vomiting, abdominal pain, diarrhea and constipation.  Genitourinary: Negative for dysuria.  Musculoskeletal:       Left arm pain  Skin: Negative for rash.  Neurological: Negative for weakness.  All other systems reviewed and are negative.     Allergies  Review of patient's allergies indicates no known allergies.  Home Medications   Prior to Admission medications   Medication Sig Start Date End Date Taking? Authorizing Provider  acetaminophen (TYLENOL) 500 MG tablet Take 1,000 mg by mouth every 6 (six) hours as needed (pain).   Yes Historical Provider, MD  aspirin EC 325 MG EC tablet Take 1 tablet (325 mg total) by mouth daily. 02/21/14  Yes Wayne E Gold, PA-C  atorvastatin (LIPITOR) 40 MG tablet TAKE 1 TABLET (40 MG TOTAL) BY MOUTH DAILY. 06/17/14  Yes Jodelle Gross, NP  Calcium-Magnesium-Vitamin D 600-40-500 MG-MG-UNIT TB24 Take 1 tablet by mouth daily.   Yes Historical Provider, MD   Cholecalciferol (VITAMIN D3) 2000 UNITS TABS Take 2,000 Units by mouth daily.   Yes Historical Provider, MD  levothyroxine (SYNTHROID, LEVOTHROID) 100 MCG tablet Take 100 mcg by mouth daily before breakfast.  02/07/14  Yes Historical Provider, MD  metoprolol succinate (TOPROL XL) 25 MG 24 hr tablet Take 0.5 tablets (12.5 mg total) by mouth daily. 03/14/14 03/14/15 Yes Scott T Weaver, PA-C   BP 133/76 mmHg  Pulse 58  Temp(Src) 98.5 F (36.9 C) (Oral)  Resp 13  Ht 5' (1.524 m)  Wt 171 lb (77.565 kg)  BMI 33.40 kg/m2  SpO2 97% Physical Exam  Constitutional: She is oriented to person, place, and time. No distress.  HENT:  Head: Normocephalic and atraumatic.  Eyes: EOM are normal. Pupils are equal, round, and reactive to light.  Neck: Normal range of motion. Neck supple.  Cardiovascular: Normal rate and intact distal pulses.   Pulmonary/Chest: No respiratory distress.  Abdominal: Soft. There is no tenderness.  Musculoskeletal: Normal range of motion.  Normal range of motion of the left shoulder, elbow.  Normal sensation/motor function in the left hand.  Normal left radial pulse.  No c spine ttp  Neurological: She is alert and oriented to person, place, and time.  Skin: No rash noted. She is not diaphoretic.  Psychiatric: She has a normal mood and affect.    ED Course  Procedures (including critical care time) Labs Review Labs Reviewed  CBC - Abnormal; Notable for the following:    Hemoglobin 11.8 (*)    RDW 19.1 (*)    All other components within normal limits  BASIC METABOLIC PANEL - Abnormal; Notable for the following:    Glucose, Bld 101 (*)    GFR calc non Af Amer 52 (*)    GFR calc Af Amer 60 (*)    All other components within normal limits  I-STAT TROPOININ, ED  I-STAT TROPOININ, ED    Imaging Review No results found.   EKG Interpretation   Date/Time:  Saturday July 27 2014 19:02:17 EDT Ventricular Rate:  66 PR Interval:  190 QRS Duration: 84 QT Interval:   384 QTC Calculation: 402 R Axis:   -23 Text Interpretation:  Sinus rhythm with Premature supraventricular  complexes Nonspecific T wave abnormality Abnormal ECG No significant  change since last tracing Confirmed by Rhunette Croft, MD, Janey Genta [phone]) on  07/27/2014 10:24:38 PM      MDM   Final diagnoses:  None   81 year old female with past medical history of hypertension hyperlipidemia coronary disease, who comes in with left arm "funny feeling".  Exam as above, VSS.  Ekg w/o ischemic changes, first trop negative.  No concern for neurovascular cause of these symptoms as she has normal radial pulse and normal motor/sensory function.  No neck pain  Will plan to delta trop.  Patient is currently chest pain free.  No pain in  the arm.  Second trop neg.  I have discussed the results, Dx and Tx plan with the patient. They expressed understanding and agree with the plan and were told to return to ED with any worsening of condition or concern.    Disposition: Discharge  Condition: Good  Discharge Medication List as of 07/27/2014 11:06 PM      Follow Up: Andi DevonKimberly Shelton, MD [address] [place] [address] [phone]      Pt seen in conjunction with Dr. Roanna BanningNanavati     Avani Sensabaugh, MD 07/28/14 [id number]  Derwood KaplanAnkit Nanavati, MD 07/28/14 [id number]

## 2014-07-31 ENCOUNTER — Ambulatory Visit (HOSPITAL_COMMUNITY): Payer: Medicare PPO

## 2014-08-02 ENCOUNTER — Ambulatory Visit (HOSPITAL_COMMUNITY): Payer: Medicare PPO

## 2014-08-07 ENCOUNTER — Ambulatory Visit (HOSPITAL_COMMUNITY): Payer: Medicare PPO

## 2014-08-09 ENCOUNTER — Ambulatory Visit (HOSPITAL_COMMUNITY): Payer: Medicare PPO

## 2014-09-04 ENCOUNTER — Ambulatory Visit (INDEPENDENT_AMBULATORY_CARE_PROVIDER_SITE_OTHER): Payer: Medicare PPO | Admitting: Cardiology

## 2014-09-04 ENCOUNTER — Encounter: Payer: Self-pay | Admitting: Cardiology

## 2014-09-04 VITALS — BP 140/70 | HR 44 | Ht 60.0 in | Wt 174.0 lb

## 2014-09-04 DIAGNOSIS — Z951 Presence of aortocoronary bypass graft: Secondary | ICD-10-CM | POA: Diagnosis not present

## 2014-09-04 DIAGNOSIS — I2510 Atherosclerotic heart disease of native coronary artery without angina pectoris: Secondary | ICD-10-CM

## 2014-09-04 DIAGNOSIS — I255 Ischemic cardiomyopathy: Secondary | ICD-10-CM

## 2014-09-04 DIAGNOSIS — I10 Essential (primary) hypertension: Secondary | ICD-10-CM | POA: Diagnosis not present

## 2014-09-04 DIAGNOSIS — I2583 Coronary atherosclerosis due to lipid rich plaque: Secondary | ICD-10-CM

## 2014-09-04 NOTE — Patient Instructions (Addendum)
Medication Instructions:  Please stop Metoprolol succinate. Continue all other medications as listed.  Labwork: none  Testing/Procedures: none  Follow-Up: Follow up in 4 months with Dr Anne FuSkains.  Thank you for choosing [facility]!!

## 2014-09-04 NOTE — Progress Notes (Signed)
Cardiology Office Note   Date:  09/04/2014   ID:  [name] [name], DOB 1933/04/02, MRN [id number]  PCP:  Alva GarnetSHELTON,KIMBERLY R., MD  Cardiologist:  Dr. Donato SchultzMark Gonsalo Cuthbertson     History of Present Illness: [name] Connerstrulia Gignac is a 81 y.o. female with a hx of HTN, HLD, hypothyroidism.  She was admitted 10/11-10/22 with NSTEMI.  LHC demonstrated critical LAD stenosis and reduced LVF with EF 35%.  FU Echo demonstrated EF 50-55% and Gr 1 diastolic dysfunction.  She was referred for CABG.  She underwent CABG with Dr. Laneta SimmersBartle (L-LAD, S-D1, S-D2).  Post op course was c/b hypotension and volume excess.  She remained in NSR.  She returns for FU.    She visited the emergency room because of some left arm discomfort. EKG was reassuring. No signs of myocardial infarction. Reassurance.  She is, overall, doing very well.  She denies significant dyspnea. She denies orthopnea, PND, edema. She denies syncope. She denies fever, cough.  She is eating well.  She is walking about 20 minutes a day.  She was interested in pursuing cardiac rehab but too expensive. Now she is doing the treadmill at home. Slowly.   Studies:  - LHC (02/11/14):  pLAD 95%, mLAD occl, CFX < 20%, pRCA 30%, EF 35%, ant and apical AK  - Echo (10/15):  EF 50% to 55%. Wall motion was normal; Grade 1 diastolic dysfunction.  Mild AI.  Ascending aorta mildly dilated. Mild MR.  Mild LAE.  PA peak pressure: 46 mm Hg (S).  - Carotid US (10/15):  Bilateral ICA 1-39%   Recent Labs/Images:  02/11/2014: TSH 2.530 05/01/2014: ALT 10; LDL (calc) 65 07/27/2014: BUN 10; Creatinine 1.00; Hemoglobin 11.8*; Potassium 3.8; Sodium 139   Dg Chest 2 View   02/10/2014    IMPRESSION: Cardiomegaly with tortuosity/ectasia of the thoracic aorta without acute cardiopulmonary disease.   Electronically Signed   By: Simonne ComeJohn  Watts M.D.   On: 02/10/2014 20:43     Wt Readings from Last 3 Encounters:  09/04/14 174 lb (78.926 kg)  07/27/14 171 lb (77.565 kg)  05/10/14 181 lb (82.101 kg)       Past Medical History  Diagnosis Date  . Hypertension   . HLD (hyperlipidemia)   . Coronary artery disease     a. NSTEMI >> LHC (02/11/14):  pLAD 95%, mLAD occl, CFX < 20%, pRCA 30%, EF 35%, ant and apical AK >> CABG  . Ischemic cardiomyopathy     a. EF 35% by cath at time of NSTEMI >> b. Echo (10/15):  EF 50% to 55%. Wall motion was normal; Grade 1 diastolic dysfunction.  Mild AI.  Ascending aorta mildly dilated. Mild MR.  Mild LAE.  PA peak pressure: 46 mm Hg (S).  . Carotid stenosis     a. Carotid US (10/15):  Bilateral ICA 1-39%  . Glucose intolerance (impaired glucose tolerance)     a. A1c 6.1 (01/2014)  . H/O non-ST elevation myocardial infarction (NSTEMI)     01/2014    Current Outpatient Prescriptions  Medication Sig Dispense Refill  . acetaminophen (TYLENOL) 500 MG tablet Take 1,000 mg by mouth every 6 (six) hours as needed (pain).    Marland Kitchen. aspirin EC 325 MG EC tablet Take 1 tablet (325 mg total) by mouth daily.    Marland Kitchen. atorvastatin (LIPITOR) 40 MG tablet TAKE 1 TABLET (40 MG TOTAL) BY MOUTH DAILY. 30 tablet 1  . Calcium-Magnesium-Vitamin D 600-40-500 MG-MG-UNIT TB24 Take 1 tablet by mouth daily.    .Marland Kitchen  Cholecalciferol (VITAMIN D3) 2000 UNITS TABS Take 2,000 Units by mouth daily.    Marland Kitchen. levothyroxine (SYNTHROID, LEVOTHROID) 100 MCG tablet Take 100 mcg by mouth daily before breakfast.     . metoprolol succinate (TOPROL XL) 25 MG 24 hr tablet Take 0.5 tablets (12.5 mg total) by mouth daily. 15 tablet 11   No current facility-administered medications for this visit.     Allergies:   Review of patient's allergies indicates no known allergies.   Social History:  The patient  reports that she has never smoked. She does not have any smokeless tobacco history on file. She reports that she does not drink alcohol or use illicit drugs.   Family History:  The patient's family history includes Diabetes in her brother and sister; Hypertension in her mother and sister; Stroke in her mother. There  is no history of Heart attack.   ROS:  Please see the history of present illness. Still having some mild chest scar pain. Mild keloid. All other systems reviewed and negative.    PHYSICAL EXAM: VS:  BP 140/70 mmHg  Pulse 44  Ht 5' (1.524 m)  Wt 174 lb (78.926 kg)  BMI 33.98 kg/m2  SpO2 97% Well nourished, well developed, in no acute distress HEENT: normal Neck:  no JVD Cardiac:  normal S1, S2;  brady; no murmur Chest:  Median sternotomy well healed, mild keloid using vitamin E Lungs:   clear to auscultation bilaterally, no wheezing, rhonchi or rales Abd: soft, nontender, no hepatomegaly Ext:  no edema Skin: warm and dry Neuro:  CNs 2-12 intact, no focal abnormalities noted  EKG:  Prior EKG NSR, HR 73, LAD, anterolateral TWI, no change from prior tracing.         ASSESSMENT AND PLAN:  1.  Coronary artery disease:  She is progressing well after recent CABG x 3 following presentation with a NSTEMI.  Dr. Laneta SimmersBartle     -  Continue ASA, statin.    -  Stopped Toprol-XL 12.5 mg QD. Bradycardia down to 44 beats a minute with very low-dose of this medication. We will go ahead and stop.    -  Cardiac rehab.  2.  Essential hypertension:  Controlled.  3.  Ischemic cardiomyopathy:  EF improved by echo to normal.  If BP increases, consider adding ACEI. 4.  HLD (hyperlipidemia):  Continue statin.     - Lipid panel - 05/01/14- LDL 65, ALT 10 5.  Carotid stenosis, bilateral:  Mild plaque pre-CABG.  Consider repeat US in 1 year.   Disposition:   We will see her back in 4 months.  Mathews RobinsonsSigned,Amina Menchaca, MD, [facility]  09/04/2014 3:06 PM    [facility] [address], [place], [place]  [address] Phone: [phone]; Fax: [phone]

## 2014-09-17 ENCOUNTER — Ambulatory Visit: Payer: Medicare PPO | Admitting: Cardiology

## 2015-01-09 ENCOUNTER — Encounter: Payer: Self-pay | Admitting: Cardiology

## 2015-01-09 ENCOUNTER — Ambulatory Visit (INDEPENDENT_AMBULATORY_CARE_PROVIDER_SITE_OTHER): Payer: Medicare PPO | Admitting: Cardiology

## 2015-01-09 VITALS — BP 136/80 | HR 62 | Ht 59.0 in | Wt 172.1 lb

## 2015-01-09 DIAGNOSIS — I255 Ischemic cardiomyopathy: Secondary | ICD-10-CM

## 2015-01-09 DIAGNOSIS — I6523 Occlusion and stenosis of bilateral carotid arteries: Secondary | ICD-10-CM

## 2015-01-09 DIAGNOSIS — I2510 Atherosclerotic heart disease of native coronary artery without angina pectoris: Secondary | ICD-10-CM | POA: Diagnosis not present

## 2015-01-09 DIAGNOSIS — I10 Essential (primary) hypertension: Secondary | ICD-10-CM

## 2015-01-09 DIAGNOSIS — Z951 Presence of aortocoronary bypass graft: Secondary | ICD-10-CM | POA: Diagnosis not present

## 2015-01-09 DIAGNOSIS — I2583 Coronary atherosclerosis due to lipid rich plaque: Secondary | ICD-10-CM

## 2015-01-09 NOTE — Patient Instructions (Signed)
Medication Instructions:  Your physician recommends that you continue on your current medications as directed. Please refer to the Current Medication list given to you today.  Follow-Up: Follow up in 1 year with Dr. Skains.  You will receive a letter in the mail 2 months before you are due.  Please call us when you receive this letter to schedule your follow up appointment.  Thank you for choosing [facility]!!       

## 2015-01-09 NOTE — Progress Notes (Signed)
Cardiology Office Note   Date:  01/09/2015   ID:  [name] [name], DOB 1933-02-02, MRN [id number]  PCP:  Alva Garnet., MD  Cardiologist:  Dr. Donato Schultz     History of Present Illness: [name] [name] is a 81 y.o. female with a hx of HTN, HLD, hypothyroidism.  She was admitted 10/11-10/22/15 with NSTEMI.  LHC demonstrated critical LAD stenosis and reduced LVF with EF 35%.  FU Echo demonstrated EF 50-55% and Gr 1 diastolic dysfunction.  She was referred for CABG.  She underwent CABG with [name] (L-LAD, S-D1, S-D2).  Post op course was c/b hypotension and volume excess.  She remained in NSR.  She returns for FU.    She visited the emergency room because of some left arm discomfort post bypass. EKG was reassuring. No signs of myocardial infarction. Reassurance.  She is, overall, doing very well.  She denies significant dyspnea. She denies orthopnea, PND, edema. She denies syncope. She denies fever, cough.  She is eating well.  She is walking about 20 minutes a day on her treadmill.    Studies:  - LHC (02/11/14):  pLAD 95%, mLAD occl, CFX < 20%, pRCA 30%, EF 35%, ant and apical AK  - Echo (10/15):  EF 50% to 55%. Wall motion was normal; Grade 1 diastolic dysfunction.  Mild AI.  Ascending aorta mildly dilated. Mild MR.  Mild LAE.  PA peak pressure: 46 mm Hg (S).  - Carotid US (10/15):  Bilateral ICA 1-39%   Recent Labs/Images:  02/11/2014: TSH 2.530 05/01/2014: ALT 10; LDL Cholesterol 65 07/27/2014: BUN 10; Creatinine, Ser 1.00; Hemoglobin 11.8*; Potassium 3.8; Sodium 139   Dg Chest 2 View   02/10/2014    IMPRESSION: Cardiomegaly with tortuosity/ectasia of the thoracic aorta without acute cardiopulmonary disease.   Electronically Signed   By: Simonne Come M.D.   On: 02/10/2014 20:43     Wt Readings from Last 3 Encounters:  01/09/15 172 lb 1.9 oz (78.073 kg)  09/04/14 174 lb (78.926 kg)  07/27/14 171 lb (77.565 kg)     Past Medical History  Diagnosis Date  . Hypertension   .  HLD (hyperlipidemia)   . Coronary artery disease     a. NSTEMI >> LHC (02/11/14):  pLAD 95%, mLAD occl, CFX < 20%, pRCA 30%, EF 35%, ant and apical AK >> CABG  . Ischemic cardiomyopathy     a. EF 35% by cath at time of NSTEMI >> b. Echo (10/15):  EF 50% to 55%. Wall motion was normal; Grade 1 diastolic dysfunction.  Mild AI.  Ascending aorta mildly dilated. Mild MR.  Mild LAE.  PA peak pressure: 46 mm Hg (S).  . Carotid stenosis     a. Carotid US (10/15):  Bilateral ICA 1-39%  . Glucose intolerance (impaired glucose tolerance)     a. A1c 6.1 (01/2014)  . H/O non-ST elevation myocardial infarction (NSTEMI)     01/2014    Current Outpatient Prescriptions  Medication Sig Dispense Refill  . acetaminophen (TYLENOL) 500 MG tablet Take 1,000 mg by mouth every 6 (six) hours as needed (pain).    Marland Kitchen aspirin EC 325 MG EC tablet Take 1 tablet (325 mg total) by mouth daily.    Marland Kitchen atorvastatin (LIPITOR) 40 MG tablet TAKE 1 TABLET (40 MG TOTAL) BY MOUTH DAILY. 30 tablet 1  . Calcium-Magnesium-Vitamin D 600-40-500 MG-MG-UNIT TB24 Take 1 tablet by mouth daily.    . Cholecalciferol (VITAMIN D3) 2000 UNITS TABS Take 2,000 Units by  mouth daily.    Marland Kitchen levothyroxine (SYNTHROID, LEVOTHROID) 100 MCG tablet Take 100 mcg by mouth daily before breakfast.      No current facility-administered medications for this visit.     Allergies:   Review of patient's allergies indicates no known allergies.   Social History:  The patient  reports that she has never smoked. She does not have any smokeless tobacco history on file. She reports that she does not drink alcohol or use illicit drugs.   Family History:  The patient's family history includes Diabetes in her brother and sister; Hypertension in her mother and sister; Stroke in her mother. There is no history of Heart attack.   ROS:  Please see the history of present illness. Still having some mild chest scar pain. Mild keloid. All other systems reviewed and negative.     PHYSICAL EXAM: VS:  BP 136/80 mmHg  Pulse 62  Ht 4\' 11"  (1.499 m)  Wt 172 lb 1.9 oz (78.073 kg)  BMI 34.75 kg/m2  SpO2 98% Well nourished, well developed, in no acute distress HEENT: normal Neck:  no JVD Cardiac:  normal S1, S2;  brady; 2/6 S murmur with soft diastolic murmur (actually aortic regurg) Chest:  Median sternotomy well healed, mild keloid using vitamin E Lungs:   clear to auscultation bilaterally, no wheezing, rhonchi or rales Abd: soft, nontender, no hepatomegaly Ext:  no edema Skin: warm and dry Neuro:  CNs 2-12 intact, no focal abnormalities noted  EKG:  Prior EKG NSR, HR 73, LAD, anterolateral TWI, no change from prior tracing.         ASSESSMENT AND PLAN:  1.  Coronary artery disease: Doing well after CABG x 3 following presentation with a NSTEMI. October 2015  [name]     -  Continue ASA, statin. No beta blocker secondary to bradycardia.    -  Stopped Toprol-XL 12.5 mg QD at previous visit secondary to Bradycardia down to 44 beats a minute heart rate now 61. Excellent. 2.  Essential hypertension:  Controlled.  3.  Ischemic cardiomyopathy:  EF improved by echo to normal.  If BP increases, consider adding ACEI. 4.  HLD (hyperlipidemia):  Continue statin. Excellent.      - Lipid panel - 05/01/14- LDL 65, ALT 10 5.  Carotid stenosis, bilateral:  Mild plaque pre-CABG.   6.  Aortic regurgitation - mild, murmur appreciated. We will follow.  Disposition:   We will see her back in 12 months.  Mathews Robinsons, MD, [facility]  01/09/2015 11:25 AM    [facility] [address] [place], [place], [place]  [address] Phone: [phone]; Fax: [phone]

## 2015-12-04 ENCOUNTER — Other Ambulatory Visit: Payer: Self-pay | Admitting: Internal Medicine

## 2015-12-04 DIAGNOSIS — E2839 Other primary ovarian failure: Secondary | ICD-10-CM

## 2015-12-18 ENCOUNTER — Other Ambulatory Visit: Payer: Self-pay | Admitting: Internal Medicine

## 2015-12-18 DIAGNOSIS — Z1231 Encounter for screening mammogram for malignant neoplasm of breast: Secondary | ICD-10-CM

## 2016-01-07 ENCOUNTER — Ambulatory Visit
Admission: RE | Admit: 2016-01-07 | Discharge: 2016-01-07 | Disposition: A | Discharge status: Home / Self Care | Payer: Medicare Other | Source: Ambulatory Visit | Attending: Internal Medicine | Admitting: Internal Medicine

## 2016-01-07 DIAGNOSIS — Z1231 Encounter for screening mammogram for malignant neoplasm of breast: Secondary | ICD-10-CM

## 2016-01-07 DIAGNOSIS — E2839 Other primary ovarian failure: Secondary | ICD-10-CM

## 2016-01-15 ENCOUNTER — Encounter: Payer: Self-pay | Admitting: Cardiology

## 2016-01-15 ENCOUNTER — Ambulatory Visit (INDEPENDENT_AMBULATORY_CARE_PROVIDER_SITE_OTHER): Payer: Medicare Other | Admitting: Cardiology

## 2016-01-15 VITALS — BP 130/76 | HR 54 | Ht <= 58 in | Wt 174.5 lb

## 2016-01-15 DIAGNOSIS — I255 Ischemic cardiomyopathy: Secondary | ICD-10-CM

## 2016-01-15 DIAGNOSIS — I2583 Coronary atherosclerosis due to lipid rich plaque: Principal | ICD-10-CM

## 2016-01-15 DIAGNOSIS — Z951 Presence of aortocoronary bypass graft: Secondary | ICD-10-CM | POA: Diagnosis not present

## 2016-01-15 DIAGNOSIS — I2510 Atherosclerotic heart disease of native coronary artery without angina pectoris: Secondary | ICD-10-CM | POA: Diagnosis not present

## 2016-01-15 DIAGNOSIS — E785 Hyperlipidemia, unspecified: Secondary | ICD-10-CM

## 2016-01-15 MED ORDER — ASPIRIN EC 81 MG PO TBEC: 81.0000 mg | DELAYED_RELEASE_TABLET | Freq: Every day | ORAL | Status: DC

## 2016-01-15 NOTE — Progress Notes (Signed)
Cardiology Office Note   Date:  01/15/2016   ID:  [name] ConnersEtrulia [name], DOB 07/29/1932, MRN [id number]  PCP:  Alva GarnetSHELTON,KIMBERLY R., MD  Cardiologist:  Dr. Donato SchultzMark Aleka Twitty     History of Present Illness: [name] [name] is a 82 y.o. female with a hx of HTN, HLD, hypothyroidism.  She was admitted 10/11-10/22/15 with NSTEMI.  LHC demonstrated critical LAD stenosis and reduced LVF with EF 35%.  FU Echo demonstrated EF 50-55% and Gr 1 diastolic dysfunction.  She was referred for CABG.  She underwent CABG with Dr. Laneta SimmersBartle (L-LAD, S-D1, S-D2).  Post op course was c/b hypotension and volume excess.  She remained in NSR.  She returns for FU.    She visited the emergency room because of some left arm discomfort post bypass. EKG was reassuring. No signs of myocardial infarction. Reassurance. She also woke up at 1. with a left arm discomfort. She took an extra aspirin. She is not had any pain since. Likely musculoskeletal.  She is, overall, doing very well.  She denies significant dyspnea. She denies orthopnea, PND, edema. She denies syncope. She denies fever, cough.  She is eating well.  She is walking about 30 minutes a day on her treadmill.    Studies:  - LHC (02/11/14):  pLAD 95%, mLAD occl, CFX < 20%, pRCA 30%, EF 35%, ant and apical AK  - Echo (10/15):  EF 50% to 55%. Wall motion was normal; Grade 1 diastolic dysfunction.  Mild AI.  Ascending aorta mildly dilated. Mild MR.  Mild LAE.  PA peak pressure: 46 mm Hg (S).  - Carotid US (10/15):  Bilateral ICA 1-39%   Recent Labs/Images:  No results found for requested labs within last 8760 hours.   Dg Chest 2 View   02/10/2014    IMPRESSION: Cardiomegaly with tortuosity/ectasia of the thoracic aorta without acute cardiopulmonary disease.   Electronically Signed   By: Simonne ComeJohn  Watts M.D.   On: 02/10/2014 20:43     Wt Readings from Last 3 Encounters:  01/15/16 174 lb 8 oz (79.2 kg)  01/09/15 172 lb 1.9 oz (78.1 kg)  09/04/14 174 lb (78.9 kg)     Past Medical  History:  Diagnosis Date  . Carotid stenosis    a. Carotid US (10/15):  Bilateral ICA 1-39%  . Coronary artery disease    a. NSTEMI >> LHC (02/11/14):  pLAD 95%, mLAD occl, CFX < 20%, pRCA 30%, EF 35%, ant and apical AK >> CABG  . Glucose intolerance (impaired glucose tolerance)    a. A1c 6.1 (01/2014)  . H/O non-ST elevation myocardial infarction (NSTEMI)    01/2014  . HLD (hyperlipidemia)   . Hypertension   . Ischemic cardiomyopathy    a. EF 35% by cath at time of NSTEMI >> b. Echo (10/15):  EF 50% to 55%. Wall motion was normal; Grade 1 diastolic dysfunction.  Mild AI.  Ascending aorta mildly dilated. Mild MR.  Mild LAE.  PA peak pressure: 46 mm Hg (S).    Current Outpatient Prescriptions  Medication Sig Dispense Refill  . acetaminophen (TYLENOL) 500 MG tablet Take 1,000 mg by mouth every 6 (six) hours as needed (pain).    Marland Kitchen. amLODipine (NORVASC) 5 MG tablet Take 5 mg by mouth daily.    Marland Kitchen. aspirin 81 MG tablet Take 1 tablet (81 mg total) by mouth daily.    Marland Kitchen. atorvastatin (LIPITOR) 40 MG tablet TAKE 1 TABLET (40 MG TOTAL) BY MOUTH DAILY. 30 tablet 1  . Calcium-Magnesium-Vitamin  D 600-40-500 MG-MG-UNIT TB24 Take 1 tablet by mouth daily.    . Cholecalciferol (VITAMIN D3) 2000 UNITS TABS Take 2,000 Units by mouth daily.    Marland Kitchen levothyroxine (SYNTHROID, LEVOTHROID) 100 MCG tablet Take 100 mcg by mouth daily before breakfast.      No current facility-administered medications for this visit.      Allergies:   Review of patient's allergies indicates no known allergies.   Social History:  The patient  reports that she has never smoked. She does not have any smokeless tobacco history on file. She reports that she does not drink alcohol or use drugs.   Family History:  The patient's family history includes Diabetes in her brother and sister; Hypertension in her mother and sister; Stroke in her mother.   ROS:  Please see the history of present illness. Still having some mild chest scar pain.  Mild keloid. All other systems reviewed and negative.    PHYSICAL EXAM: VS:  BP 130/76   Pulse (!) 54   Ht 4\' 10"  (1.473 m)   Wt 174 lb 8 oz (79.2 kg)   BMI 36.47 kg/m  Well nourished, well developed, in no acute distress  HEENT: normal  Neck:  no JVD  Cardiac:  normal S1, S2;  brady; 2/6 S murmur with soft diastolic murmur (actually aortic regurg) Chest:  Median sternotomy well healed, mild keloid using vitamin E Lungs:   clear to auscultation bilaterally, no wheezing, rhonchi or rales  Abd: soft, nontender, no hepatomegaly  Ext:  no edema  Skin: warm and dry  Neuro:  CNs 2-12 intact, no focal abnormalities noted  EKG:  Prior EKG NSR, HR 73, LAD, anterolateral TWI, no change from prior tracing.         ASSESSMENT AND PLAN:  1.  Coronary artery disease: Doing well after CABG x 3 following presentation with a NSTEMI. October 2015  [name]. She has been doing very well.    -  Continue ASA, statin. No beta blocker secondary to bradycardia.    -  Stopped Toprol-XL 12.5 mg QD at previous visit secondary to Bradycardia down to 44 beats a minute heart rate now 54-61. Excellent. 2.  Essential hypertension:  Controlled. Medication amlodipine 3.  Ischemic cardiomyopathy:  EF improved by echo to normal.  If BP increases, consider adding ACEI. 4.  HLD (hyperlipidemia):  Continue statin. Excellent.      - Lipid panel - 05/01/14- LDL 65, ALT 10 5.  Carotid stenosis, bilateral:  Mild plaque pre-CABG.   6.  Aortic regurgitation - mild, murmur appreciated. We will follow.  Disposition:   We will see her back in 12 months.  Mathews Robinsons, MD, [facility]  01/15/2016 10:50 AM    [facility] [address] [place], [place], [place]  [address] Phone: [phone]; Fax: [phone]

## 2016-01-15 NOTE — Patient Instructions (Signed)
Medication Instructions:  You may decrease your ASA to 81 mg a day. Continue all other medications as listed.  Follow-Up: Follow up in 1 year with Dr. Anne FuSkains.  You will receive a letter in the mail 2 months before you are due.  Please call us when you receive this letter to schedule your follow up appointment.  If you need a refill on your cardiac medications before your next appointment, please call your pharmacy.  Thank you for choosing [facility]!!

## 2017-01-26 ENCOUNTER — Ambulatory Visit (INDEPENDENT_AMBULATORY_CARE_PROVIDER_SITE_OTHER): Payer: Medicare Other | Admitting: Cardiology

## 2017-01-26 ENCOUNTER — Encounter: Payer: Self-pay | Admitting: Cardiology

## 2017-01-26 VITALS — BP 134/72 | HR 56 | Ht <= 58 in | Wt 170.8 lb

## 2017-01-26 DIAGNOSIS — I2510 Atherosclerotic heart disease of native coronary artery without angina pectoris: Secondary | ICD-10-CM

## 2017-01-26 DIAGNOSIS — Z951 Presence of aortocoronary bypass graft: Secondary | ICD-10-CM | POA: Diagnosis not present

## 2017-01-26 DIAGNOSIS — I2583 Coronary atherosclerosis due to lipid rich plaque: Secondary | ICD-10-CM

## 2017-01-26 DIAGNOSIS — I10 Essential (primary) hypertension: Secondary | ICD-10-CM

## 2017-01-26 DIAGNOSIS — E7800 Pure hypercholesterolemia, unspecified: Secondary | ICD-10-CM

## 2017-01-26 MED ORDER — ASPIRIN EC 81 MG PO TBEC: 81.0000 mg | DELAYED_RELEASE_TABLET | Freq: Every day | ORAL | 3 refills | Status: DC

## 2017-01-26 NOTE — Progress Notes (Signed)
Cardiology Office Note   Date:  01/26/2017   ID:  [name] [name], DOB April 24, 1933, MRN [id number]  PCP:  Andi Devon, MD  Cardiologist:  Dr. Donato Schultz     History of Present Illness: [name] [name] is a 83 y.o. female with a hx of CABG October 2015 HTN, HLD, hypothyroidism here for follow-up.   She was admitted 10/11-10/22/15 with NSTEMI.  LHC demonstrated critical LAD stenosis and reduced LVF with EF 35%.  FU Echo demonstrated EF 50-55% and Gr 1 diastolic dysfunction.  She was referred for CABG.  She underwent CABG with [name] (L-LAD, S-D1, S-D2).    She visited the emergency room because of some left arm discomfort post bypass. EKG was reassuring. No signs of myocardial infarction. Reassurance.   She still continues to be active, having no chest pain, no significant shortness of breath. She states that she is eating well.    Studies:  - LHC (02/11/14):  pLAD 95%, mLAD occl, CFX < 20%, pRCA 30%, EF 35%, ant and apical AK  - Echo (10/15):  EF 50% to 55%. Wall motion was normal; Grade 1 diastolic dysfunction.  Mild AI.  Ascending aorta mildly dilated. Mild MR.  Mild LAE.  PA peak pressure: 46 mm Hg (S).  - Carotid US (10/15):  Bilateral ICA 1-39%   Recent Labs/Images:  No results found for requested labs within last 8760 hours.     Wt Readings from Last 3 Encounters:  01/26/17 170 lb 12.8 oz (77.5 kg)  01/15/16 174 lb 8 oz (79.2 kg)  01/09/15 172 lb 1.9 oz (78.1 kg)     Past Medical History:  Diagnosis Date  . Carotid stenosis    a. Carotid US (10/15):  Bilateral ICA 1-39%  . Coronary artery disease    a. NSTEMI >> LHC (02/11/14):  pLAD 95%, mLAD occl, CFX < 20%, pRCA 30%, EF 35%, ant and apical AK >> CABG  . Glucose intolerance (impaired glucose tolerance)    a. A1c 6.1 (01/2014)  . H/O non-ST elevation myocardial infarction (NSTEMI)    01/2014  . HLD (hyperlipidemia)   . Hypertension   . Ischemic cardiomyopathy    a. EF 35% by cath at time of NSTEMI >> b.  Echo (10/15):  EF 50% to 55%. Wall motion was normal; Grade 1 diastolic dysfunction.  Mild AI.  Ascending aorta mildly dilated. Mild MR.  Mild LAE.  PA peak pressure: 46 mm Hg (S).    Current Outpatient Prescriptions  Medication Sig Dispense Refill  . acetaminophen (TYLENOL) 500 MG tablet Take 1,000 mg by mouth every 6 (six) hours as needed (pain).    Marland Kitchen amLODipine (NORVASC) 5 MG tablet Take 5 mg by mouth daily.    Marland Kitchen atorvastatin (LIPITOR) 40 MG tablet TAKE 1 TABLET (40 MG TOTAL) BY MOUTH DAILY. 30 tablet 1  . Calcium-Magnesium-Vitamin D 600-40-500 MG-MG-UNIT TB24 Take 1 tablet by mouth daily.    . Cholecalciferol (VITAMIN D3) 2000 UNITS TABS Take 2,000 Units by mouth daily.    Marland Kitchen levothyroxine (SYNTHROID, LEVOTHROID) 100 MCG tablet Take 100 mcg by mouth daily before breakfast.     . aspirin EC 81 MG tablet Take 1 tablet (81 mg total) by mouth daily. 90 tablet 3   No current facility-administered medications for this visit.      Allergies:   Patient has no known allergies.   Social History:  The patient  reports that she has never smoked. She has never used smokeless tobacco. She reports  that she does not drink alcohol or use drugs.   Family History:  The patient's family history includes Diabetes in her brother and sister; Hypertension in her mother and sister; Stroke in her mother.   ROS:  Please see the history of present illness. Denies chest pain, syncope, bleeding, orthopnea. No shortness of breath. All other systems reviewed and negative.    PHYSICAL EXAM: VS:  BP 134/72   Pulse (!) 56   Ht  (1.473 m)   Wt 170 lb 12.8 oz (77.5 kg)   LMP  (LMP Unknown)   BMI 35.70 kg/m    GEN: Well nourished, well developed, in no acute distress  HEENT: normal  Neck: no JVD, carotid bruits, or masses Cardiac: RRR; 2/6 systolic and diastolic murmur, no rubs, or gallops,no edema, scar well-healed Respiratory:  clear to auscultation bilaterally, normal work of breathing GI: soft,  nontender, nondistended, + BS MS: no deformity or atrophy  Skin: warm and dry, no rash Neuro:  Alert and Oriented x 3, Strength and sensation are intact Psych: euthymic mood, full affect  EKG:  EKG today 01/26/17 shows sinus bradycardia rate 56 with no other abnormalities personally viewed-Prior EKG NSR, HR 73, LAD, anterolateral TWI, no change from prior tracing.         ASSESSMENT AND PLAN:  1.  Coronary artery disease: Doing well after CABG x 3 following presentation with a NSTEMI. October 2015  [name]. She has been doing very well. No anginal symptoms.    -  Continue ASA, changing to 81 mg, statin. No beta blocker secondary to bradycardia.    -  Stopped Toprol-XL 12.5 mg QD at previous visit secondary to Bradycardia down to 44 beats a minute heart rate now 54-61. Excellent. 2.  Essential hypertension:  Controlled. Medication amlodipine. Doing very well. 3.  Ischemic cardiomyopathy:  EF improved by echo to normal.  If BP increases, consider adding ACEI. Seems to be doing quite well. 4.  HLD (hyperlipidemia):  Continue statin. Excellent.      - Lipid panel - 05/01/14- LDL 65, ALT 10. No changes made. No myalgias on medications. 5.  Carotid stenosis, bilateral:  Mild plaque pre-CABG. statin.   6.  Aortic regurgitation - mild, murmur appreciated. We will follow. Should be of no clinical consequence.  Disposition:   We will see her back in 12 months.  Mathews Robinsons, MD, [facility]  01/26/2017 11:42 AM    [facility] [address] [place], [place], [place]  [address] Phone: [phone]; Fax: [phone]

## 2017-01-26 NOTE — Patient Instructions (Signed)
Medication Instructions:  Please decrease your Aspirin to 81 mg a day. Continue all other medications as listed.  Follow-Up: Follow up in 1 year with Dr. Skains.  You will receive a letter in the mail 2 months before you are due.  Please call us when you receive this letter to schedule your follow up appointment.  If you need a refill on your cardiac medications before your next appointment, please call your pharmacy.  Thank you for choosing  [facility]!!     

## 2017-02-17 ENCOUNTER — Other Ambulatory Visit: Payer: Self-pay | Admitting: Internal Medicine

## 2017-02-17 DIAGNOSIS — Z1231 Encounter for screening mammogram for malignant neoplasm of breast: Secondary | ICD-10-CM

## 2017-03-16 ENCOUNTER — Ambulatory Visit
Admission: RE | Admit: 2017-03-16 | Discharge: 2017-03-16 | Disposition: A | Discharge status: Home / Self Care | Payer: Medicare Other | Source: Ambulatory Visit | Attending: Internal Medicine | Admitting: Internal Medicine

## 2017-03-16 DIAGNOSIS — Z1231 Encounter for screening mammogram for malignant neoplasm of breast: Secondary | ICD-10-CM

## 2017-08-17 ENCOUNTER — Encounter: Payer: Self-pay | Admitting: Family

## 2018-01-13 ENCOUNTER — Other Ambulatory Visit: Payer: Self-pay

## 2018-01-13 ENCOUNTER — Encounter (HOSPITAL_COMMUNITY): Payer: Self-pay

## 2018-01-13 ENCOUNTER — Emergency Department (HOSPITAL_COMMUNITY)
Admission: EM | Admit: 2018-01-13 | Discharge: 2018-01-13 | Disposition: A | Discharge status: Home / Self Care | Payer: Medicare Other | Attending: Emergency Medicine | Admitting: Emergency Medicine

## 2018-01-13 ENCOUNTER — Ambulatory Visit (INDEPENDENT_AMBULATORY_CARE_PROVIDER_SITE_OTHER)
Admission: EM | Admit: 2018-01-13 | Discharge: 2018-01-13 | Disposition: A | Discharge status: Home / Self Care | Payer: Medicare Other | Source: Home / Self Care | Attending: Family Medicine | Admitting: Family Medicine

## 2018-01-13 ENCOUNTER — Emergency Department (HOSPITAL_COMMUNITY): Payer: Medicare Other

## 2018-01-13 DIAGNOSIS — Z951 Presence of aortocoronary bypass graft: Secondary | ICD-10-CM | POA: Insufficient documentation

## 2018-01-13 DIAGNOSIS — I10 Essential (primary) hypertension: Secondary | ICD-10-CM | POA: Insufficient documentation

## 2018-01-13 DIAGNOSIS — E039 Hypothyroidism, unspecified: Secondary | ICD-10-CM | POA: Insufficient documentation

## 2018-01-13 DIAGNOSIS — Z7982 Long term (current) use of aspirin: Secondary | ICD-10-CM | POA: Insufficient documentation

## 2018-01-13 DIAGNOSIS — R079 Chest pain, unspecified: Secondary | ICD-10-CM

## 2018-01-13 DIAGNOSIS — I2510 Atherosclerotic heart disease of native coronary artery without angina pectoris: Secondary | ICD-10-CM | POA: Diagnosis not present

## 2018-01-13 DIAGNOSIS — Z79899 Other long term (current) drug therapy: Secondary | ICD-10-CM | POA: Diagnosis not present

## 2018-01-13 LAB — BASIC METABOLIC PANEL
Anion gap: 9 (ref 5–15)
BUN: 9 mg/dL (ref 8–23)
CO2: 27 mmol/L (ref 22–32)
CREATININE: 0.97 mg/dL (ref 0.44–1.00)
Calcium: 10.4 mg/dL — ABNORMAL HIGH (ref 8.9–10.3)
Chloride: 104 mmol/L (ref 98–111)
GFR calc non Af Amer: 53 mL/min — ABNORMAL LOW (ref >60)
Glucose, Bld: 102 mg/dL — ABNORMAL HIGH (ref 70–99)
Potassium: 3.9 mmol/L (ref 3.5–5.1)
SODIUM: 140 mmol/L (ref 135–145)

## 2018-01-13 LAB — CBC
HCT: 40.6 % (ref 36.0–46.0)
Hemoglobin: 12.9 g/dL (ref 12.0–15.0)
MCH: 29.4 pg (ref 26.0–34.0)
MCHC: 31.8 g/dL (ref 30.0–36.0)
MCV: 92.5 fL (ref 78.0–100.0)
Platelets: 217 10*3/uL (ref 150–400)
RBC: 4.39 MIL/uL (ref 3.87–5.11)
RDW: 15.9 % — ABNORMAL HIGH (ref 11.5–15.5)
WBC: 5.5 10*3/uL (ref 4.0–10.5)

## 2018-01-13 LAB — I-STAT TROPONIN, ED
TROPONIN I, POC: 0.01 ng/mL (ref 0.00–0.08)
Troponin i, poc: 0 ng/mL (ref 0.00–0.08)

## 2018-01-13 NOTE — ED Provider Notes (Signed)
84 year old female with history of carotid stenosis, CAD, HLD, HTN comes in with daughter for chest pain, anxiety, hypertension.  Patient states felt left-sided chest pain and felt nervous so thought it was anxiety related. Daughter states patient usually takes 325mg  of ASA daily, but took a second dose of 325mg  ASA when symptoms started. She also complains of left arm pain along with the symptoms. She denies history of anxiety, and denies increase in stress. She states just feels the "nerves" at the left chest. She denies weakness, dizziness, syncope.   EKG with sinus bradycardia with PAC. Given history, discussed with patient and daughter, cannot rule out cardiac causes/event, will discharge in stable condition to the emergency department for further evaluation. Patient and daughter expresses understanding and agrees to plan.    Belinda FisherYu, Dell Hurtubise V, PA-C 01/13/18 1540

## 2018-01-13 NOTE — ED Triage Notes (Signed)
Pt reports L side and central CP radiating to L arm starting yesterday. Pt denies SHOB.Pt reports hx of MI. Pt's daughter reports the checked her BP at home and it was elevated.

## 2018-01-13 NOTE — Discharge Instructions (Signed)
Given history of heart disease, now with left sided chest pain, please go to the emergency department for further evaluation needed.

## 2018-01-13 NOTE — ED Notes (Signed)
Pt taken to the ER by family member for further work up.

## 2018-01-13 NOTE — ED Triage Notes (Signed)
Pt presents with anxiety and elevated blood pressure.

## 2018-01-13 NOTE — ED Provider Notes (Signed)
[facility] Provider Note   CSN: [id number] Arrival date & time: 01/13/18  1543     History   Chief Complaint Chief Complaint  Patient presents with  . Chest Pain    HPI [name] [name] is a 84 y.o. female.  The history is provided by the patient and a relative.  Chest Pain   This is a new problem. The current episode started 6 to 12 hours ago. Episode frequency: lasted for approximately 15 minutes. The problem has been resolved. Quality: aching. The pain does not radiate. Duration of episode(s) is 15 minutes. Pertinent negatives include no abdominal pain, no back pain, no diaphoresis, no exertional chest pressure, no headaches, no nausea, no shortness of breath and no vomiting. Treatments tried: aspirin. The treatment provided significant relief.  Her past medical history is significant for CAD.    Past Medical History:  Diagnosis Date  . Carotid stenosis    a. Carotid US (10/15):  Bilateral ICA 1-39%  . Coronary artery disease    a. NSTEMI >> LHC (02/11/14):  pLAD 95%, mLAD occl, CFX < 20%, pRCA 30%, EF 35%, ant and apical AK >> CABG  . Glucose intolerance (impaired glucose tolerance)    a. A1c 6.1 (01/2014)  . H/O non-ST elevation myocardial infarction (NSTEMI)    01/2014  . HLD (hyperlipidemia)   . Hypertension   . Ischemic cardiomyopathy    a. EF 35% by cath at time of NSTEMI >> b. Echo (10/15):  EF 50% to 55%. Wall motion was normal; Grade 1 diastolic dysfunction.  Mild AI.  Ascending aorta mildly dilated. Mild MR.  Mild LAE.  PA peak pressure: 46 mm Hg (S).    Patient Active Problem List   Diagnosis Date Noted  . Coronary artery disease   . Hypertension   . Ischemic cardiomyopathy   . Carotid stenosis   . Hypotension 02/21/2014  . 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 HTN (hypertension) 02/11/2014  . HLD (hyperlipidemia) 02/11/2014  . Hypothyroidism 02/11/2014  . Unstable angina (HCC) 02/10/2014    Past Surgical History:    Procedure Laterality Date  . CESAREAN SECTION    . CORONARY ARTERY BYPASS GRAFT N/A 02/15/2014   Procedure: CORONARY ARTERY BYPASS GRAFTING (CABG) x three,  using left internal mammary artery and right leg greater saphenous vein harvested endoscopically;  Surgeon: Alleen BorneBryan K Bartle, MD;  Location: [facility] OR;  Service: Open Heart Surgery;  Laterality: N/A;  . LEFT HEART CATHETERIZATION WITH CORONARY ANGIOGRAM N/A 02/11/2014   Procedure: LEFT HEART CATHETERIZATION WITH CORONARY ANGIOGRAM;  Surgeon: Peter M SwazilandJordan, MD;  [facility];  Service: Cardiovascular;  Laterality: N/A;  . TEE WITHOUT CARDIOVERSION N/A 02/15/2014   Procedure: TRANSESOPHAGEAL ECHOCARDIOGRAM (TEE);  Surgeon: Alleen BorneBryan K Bartle, MD;  [facility];  Service: Open Heart Surgery;  Laterality: N/A;     OB History   None      Home Medications    Prior to Admission medications   Medication Sig Start Date End Date Taking? Authorizing Provider  acetaminophen (TYLENOL) 500 MG tablet Take 1,000 mg by mouth every 6 (six) hours as needed (pain).    [provider]  amLODipine (NORVASC) 5 MG tablet Take 5 mg by mouth daily. 01/13/16   [provider]  aspirin EC 81 MG tablet Take 1 tablet (81 mg total) by mouth daily. 01/26/17   Jake BatheSkains, Mark C, MD  atorvastatin (LIPITOR) 40 MG tablet TAKE 1 TABLET (40 MG TOTAL) BY  MOUTH DAILY. 06/17/14   Jodelle Gross, NP  B-Complex-C CAPS Take 1 capsule by mouth daily.    [provider]  Calcium-Magnesium-Vitamin D 600-40-500 MG-MG-UNIT TB24 Take 1 tablet by mouth daily.    [provider]  Cholecalciferol (VITAMIN D3) 2000 UNITS TABS Take 2,000 Units by mouth daily.    [provider]  donepezil (ARICEPT) 5 MG tablet Take 5 mg by mouth at bedtime.    [provider]  latanoprost (XALATAN) 0.005 % ophthalmic solution 1 drop at bedtime.    [provider]  levothyroxine (SYNTHROID, LEVOTHROID) 100 MCG tablet Take 100 mcg by mouth  daily before breakfast.  02/07/14   [provider]  omega-3 acid ethyl esters (LOVAZA) 1 g capsule Take 1 g by mouth daily.    [provider]  timolol (BETIMOL) 0.5 % ophthalmic solution 1 drop 2 (two) times daily.    [provider]    Family History Family History  Problem Relation Age of Onset  . Stroke Mother   . Hypertension Mother   . Hypertension Sister   . Diabetes Brother   . Diabetes Sister   . Heart attack Neg Hx   . Breast cancer Neg Hx     Social History Social History   Tobacco Use  . Smoking status: Never Smoker  . Smokeless tobacco: Never Used  Substance Use Topics  . Alcohol use: No  . Drug use: No     Allergies   Patient has no known allergies.   Review of Systems Review of Systems  Constitutional: Negative for diaphoresis.  HENT: Positive for rhinorrhea. Negative for congestion and sore throat.   Eyes: Negative for discharge.  Respiratory: Negative for shortness of breath.   Cardiovascular: Positive for chest pain.  Gastrointestinal: Negative for abdominal pain, nausea and vomiting.  Genitourinary: Negative for dysuria.  Musculoskeletal: Negative for back pain.  Skin: Negative for wound.  Neurological: Negative for headaches.     Physical Exam Updated Vital Signs BP (!) 143/72   Pulse 60   Temp 98.2 F (36.8 C) (Oral)   Resp 17   Ht 4\' 11"  (1.499 m)   Wt 65.8 kg   LMP  (LMP Unknown)   SpO2 98%   BMI 29.29 kg/m   Physical Exam  Constitutional: She appears well-developed and well-nourished. No distress.  HENT:  Head: Normocephalic and atraumatic.  Eyes: Conjunctivae are normal.  Neck: Neck supple.  Cardiovascular: Normal rate and regular rhythm.  No murmur heard. Pulmonary/Chest: Effort normal and breath sounds normal. No respiratory distress.  Abdominal: Soft. There is no tenderness.  Musculoskeletal: She exhibits no edema.  Neurological: She is alert.  Skin: Skin is warm and dry.  Psychiatric:  She has a normal mood and affect.  Nursing note and vitals reviewed.    ED Treatments / Results  Labs (all labs ordered are listed, but only abnormal results are displayed) Labs Reviewed  CBC - Abnormal; Notable for the following components:      Result Value   RDW 15.9 (*)    All other components within normal limits  BASIC METABOLIC PANEL - Abnormal; Notable for the following components:   Glucose, Bld 102 (*)    Calcium 10.4 (*)    GFR calc non Af Amer 53 (*)    All other components within normal limits  I-STAT TROPONIN, ED  I-STAT TROPONIN, ED    EKG EKG Interpretation  Date/Time:  Friday January 13 2018 16:17:53 EDT Ventricular Rate:  55 PR Interval:  208 QRS Duration: 80 QT Interval:  430 QTC Calculation: 411 R Axis:   -13 Text Interpretation:  Sinus bradycardia Otherwise normal ECG No significant change since last tracing in may of 2016 Confirmed by Marily Memos [phone]) on 01/13/2018 10:40:45 PM   Radiology Dg Chest 2 View  Result Date: 01/13/2018 CLINICAL DATA:  Left-sided chest pain beginning yesterday. Recent fall. EXAM: CHEST - 2 VIEW COMPARISON:  04/03/2014 FINDINGS: Sternotomy wires unchanged. Lungs are adequately inflated without focal airspace consolidation or effusion. Mild stable cardiomegaly. Remainder of the exam is unchanged without acute fracture. IMPRESSION: No acute findings. Electronically Signed   By: Elberta Fortis M.D.   On: 01/13/2018 17:07    Procedures Procedures (including critical care time)  Medications Ordered in ED Medications - No data to display   Initial Impression / Assessment and Plan / ED Course  I have reviewed the triage vital signs and the nursing notes.  Pertinent labs & imaging results that were available during my care of the patient were reviewed by me and considered in my medical decision making (see chart for details).     The patient is an 85yoF with PMH CAD, HTN, and DM2 who presents with chest pain that was  intermittent every few minutes and completely resolved after 15 minutes and resolved over 6 hours ago after she took an aspirin. The patient reports that she believes the pain is musculoskeletal because she fell yesterday on her outstretched arm. She denies shoulder or arm pain and has no tenderness on exam. She has a negative delta troponin, EKG unchanged from prior and shows no STEMI. Do not suspect ACS. Do not suspect PNA based on CXR, history, and absence of leukocytosis. BMP shows no electrolyte abnormalities or renal dysfunction. Do not suspect esophageal perforation based on H&P, do not suspect PNA, or ACS. Patient discharged and instructed to follow up with her PCP and her cardiologist. The patient reports understanding of and agreement with the plan and her return precautions.  Patient care supervised by Dr. Clayborne Dana.  Nash Dimmer, MD  Final Clinical Impressions(s) / ED Diagnoses   Final diagnoses:  Nonspecific chest pain    ED Discharge Orders    None       Nash Dimmer, MD 01/14/18 2440    Marily Memos, MD 01/14/18 1858

## 2018-01-13 NOTE — ED Notes (Signed)
Pt in RR

## 2018-01-13 NOTE — ED Notes (Signed)
Patient's daughter states that patient wants to leave; recommended that patient stay and be seen by ED provider. States will stay for a while longer.

## 2018-01-13 NOTE — ED Provider Notes (Signed)
Patient placed in Quick Look pathway, seen and evaluated   Chief Complaint: Chest pain  HPI: Patient with history of CABG presents to the emergency department today with complaint of left-sided chest pain.  She first felt it yesterday.  It started again at approximately 2 PM today while at rest.  It made the patient very anxious.  She also felt pain in her left arm which she thought was related to a fall that she recently had.  No shortness of breath, diaphoresis, syncope.  No abdominal pain, vomiting.  No treatments prior to arrival.  Sent from urgent care for further work-up.  ROS:  Positive ROS: (+) Chest pain Negative ROS: (-) Shortness of breath  Physical Exam:   Gen: No distress  Neuro: Awake and Alert  Skin: Warm    Focused Exam: Heart RRR, nml S1,S2, +systolic and diastolic murmur LSB, mild bradycardia; Lungs CTAB; Abd soft, NT, no rebound or guarding; Ext 2+ pedal pulses bilaterally, no edema.  BP (!) 148/68 (BP Location: Right Arm)   Pulse (!) 56   Temp 97.6 F (36.4 C) (Oral)   Resp 16   Ht 4\' 11"  (1.499 m)   Wt 65.8 kg   LMP  (LMP Unknown)   SpO2 100%   BMI 29.29 kg/m   Plan: cardiac work-up initiated.   Initiation of care has begun. The patient has been counseled on the process, plan, and necessity for staying for the completion/evaluation, and the remainder of the medical screening examination    Renne CriglerGeiple, Danyal Whitenack, Cordelia Poche-C 01/13/18 1613    Mesner, Barbara CowerJason, MD 01/14/18 [phone]

## 2018-01-31 ENCOUNTER — Encounter: Payer: Self-pay | Admitting: Cardiology

## 2018-01-31 ENCOUNTER — Ambulatory Visit: Payer: Medicare Other | Admitting: Cardiology

## 2018-01-31 VITALS — BP 146/76 | HR 50 | Ht 59.0 in | Wt 157.8 lb

## 2018-01-31 DIAGNOSIS — I10 Essential (primary) hypertension: Secondary | ICD-10-CM

## 2018-01-31 DIAGNOSIS — Z951 Presence of aortocoronary bypass graft: Secondary | ICD-10-CM | POA: Diagnosis not present

## 2018-01-31 DIAGNOSIS — E7800 Pure hypercholesterolemia, unspecified: Secondary | ICD-10-CM

## 2018-01-31 DIAGNOSIS — I2510 Atherosclerotic heart disease of native coronary artery without angina pectoris: Secondary | ICD-10-CM

## 2018-01-31 DIAGNOSIS — I2583 Coronary atherosclerosis due to lipid rich plaque: Secondary | ICD-10-CM

## 2018-01-31 MED ORDER — ASPIRIN EC 81 MG PO TBEC: 81.0000 mg | DELAYED_RELEASE_TABLET | Freq: Every day | ORAL | Status: DC

## 2018-01-31 NOTE — Progress Notes (Signed)
Cardiology Office Note:    Date:  01/31/2018   ID:  [name] [name], DOB 06-Apr-1933, MRN [id number]  PCP:  Andi Devon, MD  Cardiologist:  No primary care provider on file.  Electrophysiologist:  None   Referring MD: Andi Devon, MD     History of Present Illness:    [name] [name] is a 84 y.o. female with CAD status post CABG 2015 with hypertension hyperlipidemia hypothyroidism here for follow-up.  Back in October 2015 had non-ST elevation myocardial infarction where EF was 35%, follow-up echo normalized.  [name] performed CABG LIMA to LAD, SVG to D1, SVG to D2.  She did go to the emergency department on 01/13/2018 with nonspecific chest pain.  Sinus bradycardia was noted on ECG without any other ischemic changes, personally reviewed.  Start about 12 hours before presentation.  Nonexertional.  Aspirin provided some relief.  No vomiting, troponin was normal.  She thinks it was musculoskeletal because she fell out on her arm.  No tenderness.  Overall now she is feeling well.  She is still walking on the treadmill, walking around the house.  Watching her eats.  Her daughter is very conscientious towards her.  No fevers chills nausea vomiting syncope bleeding.   Past Medical History:  Diagnosis Date  . Carotid stenosis    a. Carotid US (10/15):  Bilateral ICA 1-39%  . Coronary artery disease    a. NSTEMI >> LHC (02/11/14):  pLAD 95%, mLAD occl, CFX < 20%, pRCA 30%, EF 35%, ant and apical AK >> CABG  . Glucose intolerance (impaired glucose tolerance)    a. A1c 6.1 (01/2014)  . H/O non-ST elevation myocardial infarction (NSTEMI)    01/2014  . HLD (hyperlipidemia)   . Hypertension   . Ischemic cardiomyopathy    a. EF 35% by cath at time of NSTEMI >> b. Echo (10/15):  EF 50% to 55%. Wall motion was normal; Grade 1 diastolic dysfunction.  Mild AI.  Ascending aorta mildly dilated. Mild MR.  Mild LAE.  PA peak pressure: 46 mm Hg (S).    Past Surgical History:  Procedure  Laterality Date  . CESAREAN SECTION    . CORONARY ARTERY BYPASS GRAFT N/A 02/15/2014   Procedure: CORONARY ARTERY BYPASS GRAFTING (CABG) x three,  using left internal mammary artery and right leg greater saphenous vein harvested endoscopically;  Surgeon: Alleen Borne, MD;  Location: [facility] OR;  Service: Open Heart Surgery;  Laterality: N/A;  . LEFT HEART CATHETERIZATION WITH CORONARY ANGIOGRAM N/A 02/11/2014   Procedure: LEFT HEART CATHETERIZATION WITH CORONARY ANGIOGRAM;  Surgeon: Peter M Swaziland, MD;  Location: [facility] CATH LAB;  Service: Cardiovascular;  Laterality: N/A;  . TEE WITHOUT CARDIOVERSION N/A 02/15/2014   Procedure: TRANSESOPHAGEAL ECHOCARDIOGRAM (TEE);  Surgeon: Alleen Borne, MD;  Location: [facility] OR;  Service: Open Heart Surgery;  Laterality: N/A;    Current Medications: Current Meds  Medication Sig  . acetaminophen (TYLENOL) 500 MG tablet Take 1,000 mg by mouth every 6 (six) hours as needed (pain).  Marland Kitchen amLODipine (NORVASC) 5 MG tablet Take 5 mg by mouth daily.  Marland Kitchen aspirin 81 MG tablet Take 1 tablet (81 mg total) by mouth daily.  Marland Kitchen atorvastatin (LIPITOR) 40 MG tablet TAKE 1 TABLET (40 MG TOTAL) BY MOUTH DAILY.  Marland Kitchen B-Complex-C CAPS Take 1 capsule by mouth daily.  . Calcium-Magnesium-Vitamin D 600-40-500 MG-MG-UNIT TB24 Take 1 tablet by mouth daily.  . Cholecalciferol (VITAMIN D3) 2000 UNITS TABS Take 2,000 Units by mouth daily.  Marland Kitchen donepezil (  ARICEPT) 5 MG tablet Take 5 mg by mouth daily after breakfast.   . latanoprost (XALATAN) 0.005 % ophthalmic solution 1 drop at bedtime.  Marland Kitchen levothyroxine (SYNTHROID, LEVOTHROID) 100 MCG tablet Take 100 mcg by mouth daily before breakfast.   . omega-3 acid ethyl esters (LOVAZA) 1 g capsule Take 1 g by mouth daily.  . timolol (BETIMOL) 0.5 % ophthalmic solution Place 1 drop into the left eye 2 (two) times daily.   . [DISCONTINUED] aspirin 325 MG EC tablet Take 325 mg by mouth daily.     Allergies:   Patient has no known allergies.   Social History    Socioeconomic History  . Marital status: Single    Spouse name: Not on file  . Number of children: Not on file  . Years of education: Not on file  . Highest education level: Not on file  Occupational History  . Not on file  Social Needs  . Financial resource strain: Not on file  . Food insecurity:    Worry: Not on file    Inability: Not on file  . Transportation needs:    Medical: Not on file    Non-medical: Not on file  Tobacco Use  . Smoking status: Never Smoker  . Smokeless tobacco: Never Used  Substance and Sexual Activity  . Alcohol use: No  . Drug use: No  . Sexual activity: Not on file  Lifestyle  . Physical activity:    Days per week: Not on file    Minutes per session: Not on file  . Stress: Not on file  Relationships  . Social connections:    Talks on phone: Not on file    Gets together: Not on file    Attends religious service: Not on file    Active member of club or organization: Not on file    Attends meetings of clubs or organizations: Not on file    Relationship status: Not on file  Other Topics Concern  . Not on file  Social History Narrative  . Not on file     Family History: The patient's family history includes Diabetes in her brother and sister; Hypertension in her mother and sister; Stroke in her mother. There is no history of Heart attack or Breast cancer.  ROS:   Please see the history of present illness.    Denies any fevers chills nausea vomiting syncope bleeding all other systems reviewed and are negative.  EKGs/Labs/Other Studies Reviewed:    The following studies were reviewed today: ER note, lab work, EKG, catheterization  ECHO: 2015  - Low normal to mildly reduced LV function; EF 50; grade 1 diastolic dysfunction; mild LAE; mild MR and AI; mildly elevated pulmonary pressure.  EKG: 01/13/2018- sinus bradycardia T wave inversion noted V2.  Personally reviewed and interpreted  Recent Labs: 01/13/2018: BUN 9; Creatinine,  Ser 0.97; Hemoglobin 12.9; Platelets 217; Potassium 3.9; Sodium 140  Recent Lipid Panel    Component Value Date/Time   CHOL 125 05/01/2014 0813   TRIG 61.0 05/01/2014 0813   HDL 47.60 05/01/2014 0813   CHOLHDL 3 05/01/2014 0813   VLDL 12.2 05/01/2014 0813   LDLCALC 65 05/01/2014 0813    Physical Exam:    VS:  BP (!) 146/76   Pulse (!) 50   Ht 4\' 11"  (1.499 m)   Wt 157 lb 12.8 oz (71.6 kg)   LMP  (LMP Unknown)   SpO2 97%   BMI 31.87 kg/m     Wt  Readings from Last 3 Encounters:  01/31/18 157 lb 12.8 oz (71.6 kg)  01/13/18 145 lb (65.8 kg)  01/26/17 170 lb 12.8 oz (77.5 kg)     GEN:  Well nourished, well developed in no acute distress HEENT: Normal NECK: No JVD; No carotid bruits LYMPHATICS: No lymphadenopathy CARDIAC: RRR, 1/6 diastolic murmur right upper sternal border, no rubs, gallops RESPIRATORY:  Clear to auscultation without rales, wheezing or rhonchi  ABDOMEN: Soft, non-tender, non-distended MUSCULOSKELETAL:  No edema; No deformity  SKIN: Warm and dry NEUROLOGIC:  Alert and oriented x 3 PSYCHIATRIC:  Normal affect   ASSESSMENT:    1. Coronary artery disease due to lipid rich plaque   2. S/P CABG x 3   3. Pure hypercholesterolemia   4. Essential hypertension    PLAN:    In order of problems listed above:  CAD status post CABG 2015 - Stable, secondary prevention.  No changes other than reducing aspirin 81 a day.  Atypical chest pain -Musculoskeletal from fall.  Troponins normal.  EKG overall reassuring.  Essential hypertension - Well-controlled.  Medications reviewed.  Resolution of ischemic cardiomyopathy post bypass.  Excellent.  Carotid stenosis bilateral -Mild plaque.  Statin.  Aortic regurgitation -Mild.  Murmur heard on exam.  Should be of no clinical significance.   Medication Adjustments/Labs and Tests Ordered: Current medicines are reviewed at length with the patient today.  Concerns regarding medicines are outlined above.  No orders  of the defined types were placed in this encounter.  Meds ordered this encounter  Medications  . aspirin 81 MG tablet    Sig: Take 1 tablet (81 mg total) by mouth daily.    Patient Instructions  Medication Instructions:  Please decrease your Aspirin to 81 mg a day. Continue all other medications as listed.  Follow-Up: Follow up in 1 year with Dr. Anne Fu.  You will receive a letter in the mail 2 months before you are due.  Please call us when you receive this letter to schedule your follow up appointment.  If you need a refill on your cardiac medications before your next appointment, please call your pharmacy.  Thank you for choosing [facility]!!        Signed, Donato Schultz, MD  01/31/2018 9:51 AM    [facility]

## 2018-01-31 NOTE — Patient Instructions (Signed)
Medication Instructions:  Please decrease your Aspirin to 81 mg a day. Continue all other medications as listed.  Follow-Up: Follow up in 1 year with Dr. Skains.  You will receive a letter in the mail 2 months before you are due.  Please call us when you receive this letter to schedule your follow up appointment.  If you need a refill on your cardiac medications before your next appointment, please call your pharmacy.  Thank you for choosing [facility]!!     

## 2018-10-11 ENCOUNTER — Other Ambulatory Visit: Payer: Self-pay | Admitting: Internal Medicine

## 2018-10-11 DIAGNOSIS — F0391 Unspecified dementia with behavioral disturbance: Secondary | ICD-10-CM

## 2018-11-29 ENCOUNTER — Other Ambulatory Visit: Payer: Self-pay

## 2018-11-29 ENCOUNTER — Ambulatory Visit
Admission: RE | Admit: 2018-11-29 | Discharge: 2018-11-29 | Disposition: A | Discharge status: Home / Self Care | Payer: Medicare Other | Source: Ambulatory Visit | Attending: Internal Medicine | Admitting: Internal Medicine

## 2018-11-29 DIAGNOSIS — F0391 Unspecified dementia with behavioral disturbance: Secondary | ICD-10-CM

## 2018-11-29 MED ORDER — GADOBENATE DIMEGLUMINE 529 MG/ML IV SOLN
12.0000 mL | Freq: Once | INTRAVENOUS | Status: AC | PRN
  Administered 2018-11-29: 12 mL via INTRAVENOUS

## 2019-01-13 NOTE — Progress Notes (Deleted)
{Choose 1 Note Type (Telehealth Visit or Telephone Visit):[phone]}   Date:  01/13/2019   ID:  [name] ConnersEtrulia Lattin, DOB [birth year], MRN [id number]  {Patient [phone]::"Home"} {Provider Location:[phone]::"Home"}  PCP:  Andi DevonShelton, Kimberly, MD  Cardiologist:  No primary care provider on file. *** Electrophysiologist:  None   Evaluation Performed:  {Choose Visit Type:[phone]::"Follow-Up Visit"}  Chief Complaint:  ***  History of Present Illness:    [name] Connerstrulia [name] is a 85 y.o. female with a hx of CAD s/p CABG 2015, HTN, HLD and hypothyroidism, seen for Dr. Anne FuSkains  In 01/2014 Ms. Cheetham had a NSTEMI in which her EF was 35% that normalized. She was referred for CABG in which she had bypass with LIMA to LAD, SVG to D1 and SVG to D2.   01/13/2018 she had an ED visit with non-specific chest pain in which her EKG was without ischemia after 12 hours of chest pain.   She was last seen by Dr. Syliva OvermanSkians on 01/31/2018 in which she was noted to be feeling well without anginal symptoms. She was walking on her treadmill and around her house without anginal pain.    1. CAD s/p CABG  -Stable with no anginal symptoms -Continue ASA, statin  -BB?   2. Atypical chest pain: -Denies recurrent symptoms   3. HTN: -Stable  -Continue current regimen with amlodipine  4. Ischemic cardiomyopathy with normalization: -Initially with reduced EF that normalized by repeat echocardiogram -No symptoms  5. Carotid stenosis: -Bilateral carotid stenosis>>milg -Aggressive secondary prevention      The patient {does/does not:200015} have symptoms concerning for COVID-19 infection (fever, chills, cough, or new shortness of breath).    Past Medical History:  Diagnosis Date  . Carotid stenosis    a. Carotid US (10/15):  Bilateral ICA 1-39%  . Coronary artery disease    a. NSTEMI >> LHC (02/11/14):  pLAD 95%, mLAD occl, CFX < 20%, pRCA 30%, EF 35%, ant and apical AK >> CABG  . Glucose intolerance  (impaired glucose tolerance)    a. A1c 6.1 (01/2014)  . H/O non-ST elevation myocardial infarction (NSTEMI)    01/2014  . HLD (hyperlipidemia)   . Hypertension   . Ischemic cardiomyopathy    a. EF 35% by cath at time of NSTEMI >> b. Echo (10/15):  EF 50% to 55%. Wall motion was normal; Grade 1 diastolic dysfunction.  Mild AI.  Ascending aorta mildly dilated. Mild MR.  Mild LAE.  PA peak pressure: 46 mm Hg (S).   Past Surgical History:  Procedure Laterality Date  . CESAREAN SECTION    . CORONARY ARTERY BYPASS GRAFT N/A 02/15/2014   Procedure: CORONARY ARTERY BYPASS GRAFTING (CABG) x three,  using left internal mammary artery and right leg greater saphenous vein harvested endoscopically;  Surgeon: Alleen BorneBryan K Bartle, MD;  Location: [facility] OR;  Service: Open Heart Surgery;  Laterality: N/A;  . LEFT HEART CATHETERIZATION WITH CORONARY ANGIOGRAM N/A 02/11/2014   Procedure: LEFT HEART CATHETERIZATION WITH CORONARY ANGIOGRAM;  Surgeon: Peter M SwazilandJordan, MD;  [facility];  Service: Cardiovascular;  Laterality: N/A;  . TEE WITHOUT CARDIOVERSION N/A 02/15/2014   Procedure: TRANSESOPHAGEAL ECHOCARDIOGRAM (TEE);  Surgeon: Alleen BorneBryan K Bartle, MD;  [facility];  Service: Open Heart Surgery;  Laterality: N/A;     No outpatient medications have been marked as taking for the 01/15/19 encounter (Appointment) with Filbert SchilderMcDaniel, Devaney Segers D, NP.     Allergies:   Patient has no known allergies.   Social History   Tobacco Use  .  Smoking status: Never Smoker  . Smokeless tobacco: Never Used  Substance Use Topics  . Alcohol use: No  . Drug use: No     Family Hx: The patient's family history includes Diabetes in her brother and sister; Hypertension in her mother and sister; Stroke in her mother. There is no history of Heart attack or Breast cancer.  ROS:   Please see the history of present illness.    *** All other systems reviewed and are negative.   Prior CV studies:   The following studies were reviewed  today:  ECHO: 01/2014  - Left ventricle: Systolic function was normal. The estimated  ejection fraction was in the range of 50% to 55%.  - Aortic valve: No evidence of vegetation. There was mild  regurgitation.  - Mitral valve: No evidence of vegetation.  - Left atrium: No evidence of thrombus in the appendage. No  evidence of thrombus in the atrial cavity or appendage.  - Atrial septum: There was a patent foramen ovale.  - Tricuspid valve: No evidence of vegetation.   Labs/Other Tests and Data Reviewed:    EKG:  {EKG/Telemetry Strips Reviewed:[phone]}  Recent Labs: No results found for requested labs within last 8760 hours.   Recent Lipid Panel Lab Results  Component Value Date/Time   CHOL 125 05/01/2014 08:13 AM   TRIG 61.0 05/01/2014 08:13 AM   HDL 47.60 05/01/2014 08:13 AM   CHOLHDL 3 05/01/2014 08:13 AM   LDLCALC 65 05/01/2014 08:13 AM    Wt Readings from Last 3 Encounters:  01/31/18 157 lb 12.8 oz (71.6 kg)  01/13/18 145 lb (65.8 kg)  01/26/17 170 lb 12.8 oz (77.5 kg)     Objective:    Vital Signs:  LMP  (LMP Unknown)    {[facility] Virtual Exam (Optional):[phone]::"VITAL SIGNS:  reviewed"}  ASSESSMENT & PLAN:    1. ***  COVID-19 Education: The signs and symptoms of COVID-19 were discussed with the patient and how to seek care for testing (follow up with PCP or arrange E-visit).  ***The importance of social distancing was discussed today.  Time:   Today, I have spent *** minutes with the patient with telehealth technology discussing the above problems.     Medication Adjustments/Labs and Tests Ordered: Current medicines are reviewed at length with the patient today.  Concerns regarding medicines are outlined above.   Tests Ordered: No orders of the defined types were placed in this encounter.   Medication Changes: No orders of the defined types were placed in this encounter.   Follow Up:  {F/U [phone]} {follow  up:15908}  Signed, Kathyrn Drown, NP  01/13/2019 7:04 PM    [facility]

## 2019-01-15 ENCOUNTER — Other Ambulatory Visit: Payer: Self-pay

## 2019-01-15 ENCOUNTER — Telehealth: Payer: Medicare Other | Admitting: Cardiology

## 2019-04-23 ENCOUNTER — Other Ambulatory Visit: Payer: Self-pay

## 2019-04-23 ENCOUNTER — Encounter (HOSPITAL_COMMUNITY): Payer: Self-pay | Admitting: Emergency Medicine

## 2019-04-23 ENCOUNTER — Ambulatory Visit (HOSPITAL_COMMUNITY)
Admission: EM | Admit: 2019-04-23 | Discharge: 2019-04-23 | Disposition: A | Discharge status: Home / Self Care | Payer: Medicare Other | Attending: Internal Medicine | Admitting: Internal Medicine

## 2019-04-23 DIAGNOSIS — W19XXXA Unspecified fall, initial encounter: Secondary | ICD-10-CM | POA: Diagnosis not present

## 2019-04-23 DIAGNOSIS — M7918 Myalgia, other site: Secondary | ICD-10-CM | POA: Diagnosis not present

## 2019-04-23 DIAGNOSIS — I10 Essential (primary) hypertension: Secondary | ICD-10-CM

## 2019-04-23 NOTE — ED Triage Notes (Addendum)
Fell last Thursday.  Skinned right knee and nose.  Since then has complained of abd pain, upper abdominal pain and mid back.  Since fall, this complaint has become more consistent.  Patient has dementia.     Patient was outside and fell in grassy area. Patient was outside and pulled away from caregiver and fell on concrete driveway.

## 2019-04-23 NOTE — ED Provider Notes (Signed)
[facility]    CSN: [id number] Arrival date & time: 04/23/19  [id number]      History   Chief Complaint Chief Complaint  Patient presents with  . Fall    HPI [name] [name] is a 85 y.o. female with a history of dementia, coronary artery disease-stable comes to urgent care accompanied by his son for complaints of upper abdominal pain.  Patient sustained a fall last Thursday and has been complaining of generalized body pains mainly in the back and upper abdomen since then.  Pain is aggravated by movement.  Pain is said to be transient and intermittent.  No shortness of breath, cough or sputum production.  No nausea/vomiting/diarrhea.  No constipation.  Patient did not lose consciousness after she fell on Thursday.  Patient has moderate dementia with behavioral disturbances and her son was the main source of the HPI.  Past Medical History:  Diagnosis Date  . Carotid stenosis    a. Carotid US (10/15):  Bilateral ICA 1-39%  . Coronary artery disease    a. NSTEMI >> LHC (02/11/14):  pLAD 95%, mLAD occl, CFX < 20%, pRCA 30%, EF 35%, ant and apical AK >> CABG  . Glucose intolerance (impaired glucose tolerance)    a. A1c 6.1 (01/2014)  . H/O non-ST elevation myocardial infarction (NSTEMI)    01/2014  . HLD (hyperlipidemia)   . Hypertension   . Ischemic cardiomyopathy    a. EF 35% by cath at time of NSTEMI >> b. Echo (10/15):  EF 50% to 55%. Wall motion was normal; Grade 1 diastolic dysfunction.  Mild AI.  Ascending aorta mildly dilated. Mild MR.  Mild LAE.  PA peak pressure: 46 mm Hg (S).    Patient Active Problem List   Diagnosis Date Noted  . Coronary artery disease   . Hypertension   . Ischemic cardiomyopathy   . Carotid stenosis   . Hypotension 02/21/2014  . 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 HTN (hypertension) 02/11/2014  . HLD (hyperlipidemia) 02/11/2014  . Hypothyroidism 02/11/2014  . Unstable angina (HCC) 02/10/2014    Past Surgical History:  Procedure Laterality Date  .  CESAREAN SECTION    . CORONARY ARTERY BYPASS GRAFT N/A 02/15/2014   Procedure: CORONARY ARTERY BYPASS GRAFTING (CABG) x three,  using left internal mammary artery and right leg greater saphenous vein harvested endoscopically;  Surgeon: Alleen BorneBryan K Bartle, MD;  Location: [facility] OR;  Service: Open Heart Surgery;  Laterality: N/A;  . LEFT HEART CATHETERIZATION WITH CORONARY ANGIOGRAM N/A 02/11/2014   Procedure: LEFT HEART CATHETERIZATION WITH CORONARY ANGIOGRAM;  Surgeon: Peter M SwazilandJordan, MD;  [facility];  Service: Cardiovascular;  Laterality: N/A;  . TEE WITHOUT CARDIOVERSION N/A 02/15/2014   Procedure: TRANSESOPHAGEAL ECHOCARDIOGRAM (TEE);  Surgeon: Alleen BorneBryan K Bartle, MD;  [facility];  Service: Open Heart Surgery;  Laterality: N/A;    OB History   No obstetric history on file.      Home Medications    Prior to Admission medications   Medication Sig Start Date End Date Taking? Authorizing Provider  ALPRAZolam Prudy Feeler(XANAX) 0.25 MG tablet Take 0.25 mg by mouth at bedtime as needed for anxiety.   Yes [provider]  amLODipine (NORVASC) 5 MG tablet Take 5 mg by mouth daily. 01/13/16  Yes [provider]  aspirin 81 MG tablet Take 1 tablet (81 mg total) by mouth daily. 01/31/18  Yes Jake BatheSkains, Mark C, MD  atorvastatin (LIPITOR) 40 MG tablet TAKE 1 TABLET (40 MG TOTAL)  BY MOUTH DAILY. 06/17/14  Yes Lendon Colonel, NP  B-Complex-C CAPS Take 1 capsule by mouth daily.   Yes [provider]  busPIRone (BUSPAR) 30 MG tablet Take 30 mg by mouth 2 (two) times daily.   Yes [provider]  Cholecalciferol (VITAMIN D3) 2000 UNITS TABS Take 2,000 Units by mouth daily.   Yes [provider]  Eszopiclone 3 MG TABS Take 3 mg by mouth at bedtime. Take immediately before bedtime   Yes [provider]  ezetimibe (ZETIA) 10 MG tablet Take 10 mg by mouth daily.   Yes [provider]  latanoprost (XALATAN) 0.005 % ophthalmic solution 1 drop at bedtime.    Yes [provider]  levothyroxine (SYNTHROID, LEVOTHROID) 100 MCG tablet Take 100 mcg by mouth daily before breakfast.  02/07/14  Yes [provider]  acetaminophen (TYLENOL) 500 MG tablet Take 1,000 mg by mouth every 6 (six) hours as needed (pain).    [provider]  Calcium-Magnesium-Vitamin D 600-40-500 MG-MG-UNIT TB24 Take 1 tablet by mouth daily.    [provider]  omega-3 acid ethyl esters (LOVAZA) 1 g capsule Take 1 g by mouth daily.    [provider]  donepezil (ARICEPT) 5 MG tablet Take 5 mg by mouth daily after breakfast.   04/23/19  [provider]    Family History Family History  Problem Relation Age of Onset  . Stroke Mother   . Hypertension Mother   . Hypertension Sister   . Diabetes Brother   . Diabetes Sister   . Heart attack Neg Hx   . Breast cancer Neg Hx     Social History Social History   Tobacco Use  . Smoking status: Never Smoker  . Smokeless tobacco: Never Used  Substance Use Topics  . Alcohol use: No  . Drug use: No     Allergies   Patient has no known allergies.   Review of Systems Review of Systems  Unable to perform ROS: Dementia     Physical Exam Triage Vital Signs ED Triage Vitals  Enc Vitals Group     BP 04/23/19 0843 (!) 149/74     Pulse Rate 04/23/19 0843 (!) 52     Resp --      Temp 04/23/19 0843 97.6 F (36.4 C)     Temp Source 04/23/19 0843 Oral     SpO2 04/23/19 0843 97 %     Weight --      Height --      Head Circumference --      Peak Flow --      Pain Score 04/23/19 0845 0     Pain Loc --      Pain Edu? --      Excl. in [place]? --    No data found.  Updated Vital Signs BP (!) 149/74 (BP Location: Right Arm)   Pulse (!) 52   Temp 97.6 F (36.4 C) (Oral)   LMP  (LMP Unknown)   SpO2 97%   Visual Acuity Right Eye Distance:   Left Eye Distance:   Bilateral Distance:    Right Eye Near:   Left Eye Near:    Bilateral Near:     Physical Exam Vitals and  nursing note reviewed.  Constitutional:      General: She is not in acute distress.    Appearance: She is not ill-appearing.  HENT:     Head: Normocephalic and atraumatic.     Nose:  Comments: 1 cm abrasion on the left nasal ala    Mouth/Throat:     Mouth: Mucous membranes are moist.  Eyes:     Conjunctiva/sclera: Conjunctivae normal.  Cardiovascular:     Rate and Rhythm: Normal rate and regular rhythm.     Pulses: Normal pulses.     Heart sounds: No murmur. No friction rub.  Pulmonary:     Effort: Pulmonary effort is normal. No respiratory distress.     Breath sounds: Normal breath sounds. No rhonchi.  Abdominal:     General: Bowel sounds are normal. There is no distension.     Palpations: Abdomen is soft.     Tenderness: There is no abdominal tenderness.  Musculoskeletal:        General: No swelling, tenderness, deformity or signs of injury. Normal range of motion.     Comments: Half a centimeter superficial abrasion on the right knee.  No surrounding erythema  Skin:    General: Skin is warm.     Capillary Refill: Capillary refill takes less than 2 seconds.  Neurological:     Mental Status: She is alert.      UC Treatments / Results  Labs (all labs ordered are listed, but only abnormal results are displayed) Labs Reviewed - No data to display  EKG   Radiology No results found.  Procedures Procedures (including critical care time)  Medications Ordered in UC Medications - No data to display  Initial Impression / Assessment and Plan / UC Course  I have reviewed the triage vital signs and the nursing notes.  Pertinent labs & imaging results that were available during my care of the patient were reviewed by me and considered in my medical decision making (see chart for details).     1.  Musculoskeletal pain: Tylenol as needed Musculoskeletal evaluation was negative for any tenderness or limitations in range of motion.  Patient denied pain with full range  of motion.  Family is advised to continue using Tylenol as needed.  The family had concerns about worsening behavioral disturbances associated with dementia.  I encouraged him to bring that issue up with the primary care physician so she can get referral to neuropsychiatric specialist to have psychotropics for behavioral disturbances control.  It appears that his circadian rhythm has become significantly impacted over the past few months. Final Clinical Impressions(s) / UC Diagnoses   Final diagnoses:  Fall, initial encounter  Musculoskeletal pain   Discharge Instructions   None    ED Prescriptions    None     PDMP not reviewed this encounter.   Merrilee Jansky, MD 04/23/19 [phone]

## 2019-05-14 LAB — BASIC METABOLIC PANEL
BUN: 11 (ref 4–21)
CO2: 25 — AB (ref 13–22)
Chloride: 103 (ref 99–108)
Creatinine: 1.1 (ref 0.5–1.1)
Glucose: 88
Potassium: 4.3 (ref 3.4–5.3)
Sodium: 142 (ref 137–147)

## 2019-05-14 LAB — COMPREHENSIVE METABOLIC PANEL
Albumin: 4.1 (ref 3.5–5.0)
Calcium: 10 (ref 8.7–10.7)
GFR calc Af Amer: 54
GFR calc non Af Amer: 47
Globulin: 3.1

## 2019-05-14 LAB — LIPID PANEL
Cholesterol: 200 (ref 0–200)
HDL: 85 — AB (ref 35–70)
Triglycerides: 54 (ref 40–160)

## 2019-05-14 LAB — HEPATIC FUNCTION PANEL
ALT: 6 — AB (ref 7–35)
AST: 15 (ref 13–35)
Alkaline Phosphatase: 61 (ref 25–125)
Bilirubin, Total: 0.2

## 2019-05-17 ENCOUNTER — Telehealth: Payer: Self-pay

## 2019-05-17 NOTE — Telephone Encounter (Signed)
Patient is on schedule to see Richarda Blade, NP Monday 05/21/2019 at 11am. Patient daughter "[name]" called today 05/17/2019 and states that she hasn't filled out new patient packet. Patient daughter states that she will try and get new patient packet filled out and turned in tomorrow. However if it's not possible. She will call and have her mom rescheduled.

## 2019-05-21 ENCOUNTER — Encounter: Payer: Self-pay | Admitting: Family

## 2019-05-21 ENCOUNTER — Ambulatory Visit (INDEPENDENT_AMBULATORY_CARE_PROVIDER_SITE_OTHER): Payer: Medicare PPO | Admitting: Family

## 2019-05-21 ENCOUNTER — Other Ambulatory Visit: Payer: Self-pay

## 2019-05-21 VITALS — BP 122/68 | HR 46 | Temp 97.8°F | Resp 18 | Ht <= 58 in | Wt 140.0 lb

## 2019-05-21 DIAGNOSIS — I10 Essential (primary) hypertension: Secondary | ICD-10-CM | POA: Diagnosis not present

## 2019-05-21 DIAGNOSIS — F411 Generalized anxiety disorder: Secondary | ICD-10-CM | POA: Diagnosis not present

## 2019-05-21 DIAGNOSIS — E669 Obesity, unspecified: Secondary | ICD-10-CM | POA: Insufficient documentation

## 2019-05-21 DIAGNOSIS — I119 Hypertensive heart disease without heart failure: Secondary | ICD-10-CM | POA: Insufficient documentation

## 2019-05-21 DIAGNOSIS — G4700 Insomnia, unspecified: Secondary | ICD-10-CM

## 2019-05-21 DIAGNOSIS — E039 Hypothyroidism, unspecified: Secondary | ICD-10-CM

## 2019-05-21 DIAGNOSIS — I2510 Atherosclerotic heart disease of native coronary artery without angina pectoris: Secondary | ICD-10-CM

## 2019-05-21 DIAGNOSIS — F03918 Unspecified dementia, unspecified severity, with other behavioral disturbance: Secondary | ICD-10-CM | POA: Insufficient documentation

## 2019-05-21 DIAGNOSIS — E782 Mixed hyperlipidemia: Secondary | ICD-10-CM

## 2019-05-21 DIAGNOSIS — I2583 Coronary atherosclerosis due to lipid rich plaque: Secondary | ICD-10-CM

## 2019-05-21 DIAGNOSIS — E6609 Other obesity due to excess calories: Secondary | ICD-10-CM

## 2019-05-21 DIAGNOSIS — F0391 Unspecified dementia with behavioral disturbance: Secondary | ICD-10-CM

## 2019-05-21 DIAGNOSIS — Z6830 Body mass index (BMI) 30.0-30.9, adult: Secondary | ICD-10-CM

## 2019-05-21 DIAGNOSIS — Z23 Encounter for immunization: Secondary | ICD-10-CM | POA: Diagnosis not present

## 2019-05-21 DIAGNOSIS — E559 Vitamin D deficiency, unspecified: Secondary | ICD-10-CM

## 2019-05-21 MED ORDER — TETANUS-DIPHTH-ACELL PERTUSSIS 5-2.5-18.5 LF-MCG/0.5 IM SUSP: 0.5000 mL | Freq: Once | INTRAMUSCULAR | 0 refills | Status: AC

## 2019-05-21 NOTE — Progress Notes (Addendum)
Provider: Marlowe Sax FNP-C   Ilya Ess, Nelda Bucks, NP  Patient Care Team: Ekaterina Denise, Nelda Bucks, NP as PCP - General (Family Medicine)  Extended Emergency Contact Information Primary Emergency Contact: Gathright,Robin Address: [address]          [place] [address] [place] Phone: [phone] Relation: Daughter Secondary Emergency Contact: Kerston, [place] Mobile Phone: [phone] Relation: Son  Code Status: Full Code Goals of care: Advanced Directive information Advanced Directives 05/21/2019  Does Patient Have a Medical Advance Directive? No  Would patient like information on creating a medical advance directive? Yes (MAU/Ambulatory/Procedural Areas - Information given)     Chief Complaint  Patient presents with  . Establish Care    New Patient    HPI:  Pt is a 86 y.o. female seen today to establish care for medical management of chronic diseases.she is here escorted by her daughter and son.she lives with her daughter who is the primary care giver.Her previous PCP was [place].daughter and son provides HPI information due to patient's cognitive impairment secondary to dementia.she has a medical history of Hypertension,CAD s/p CABG x 3 with Dr.Bartle due to NSTEMI in October, 2015,Hyperlipidemia,Hypothyroidism,insomnia,Vitamin D deficiency,,Generalized anxiety disorder and Dementia with Behavioral disturbance.Has right eye blindness.  Hypertension - daughter states recently bought a B/P cuff and pulse oxy but has not checked her blood pressure.Takes amlodipine 5 mg tablet daily.No signs of hypotension reported.states her heart rate runs low which normal for patient.she does not complain of any headache,dizziness or fatigue.HR 46 b/min rechecked Apical was 56 b/min. On chart review,chronic sinus brady cardia noted.Daughter states tries to include lots of fruits and veggies in the diet.though likes ice cream. She drinks coffee one cup every morning.she does not  smoke cigarettes or drink any alcohol.  Hyperlipidemia - on atorvastatin 40 mg tablet daily and Ezetimibe 10 mg tablet daily. States labs recently drawn by PCP.will request medical records.Patient states exercises by walking up and down the hallway in the house.   Generalized anxiety disorder- overall Alprazolam 0.25 mg tablet tablet Takes 2 Tablets three times daily has been effective but sometimes has episode when she is combative and confused.    Hypothyroidism - Levothyroxine recently adjusted by Dr.Shelton on  05/10/2019  from 100 mcg to 75 mcg tablet daily on an empty stomach.Daughter states trying to give  Medication on an empty stomach but sometimes patient refuses and just have to give it with food.   CAD with CABG x 3 - denies any chest pain or shortness of breath.on ASA 325 mg tablet daily,atorvastatin 40 mg tablet daily and Ezetimibe 10 mg tablet daily.No recent labs for review.   Dementia with Behavioral disturbance- she dresses her self but requires assistance with bathing though daughter and Son states this depends on her mood.sometimes she becomes combative.she tries to exit the house at times and tells daughter that she is going to her house despite being in her house.she also feeds herself depending on her mood.she eats two meals per day.No weight loss reported. Has very good appetite.currently on Alprazolam 0.25 mg tablet Takes 2 Tablet three times daily and Buspirone 15-30 mg tablet twice daily.Daughter states sometimes gives only 15 mg tablet twice daily due to increase sleepiness and tendency to be more confused when she takes 30 mg tablet.Family request assistance with care with ADL's due to increase combativeness.    Insomnia - on Eszopiclone 3 mg tablet at bedtime as needed has required twice per week.Alprazolam effective most times.  Health maintenance:  She is due for her influenza and Pneumonia Vaccine.Also due for Tdap Vaccine.Agrees to get influenza and Pneumonia  vaccine.Will send script to pharmacy for her Tdap vaccine.    Past Medical History:  Diagnosis Date  . Carotid stenosis    a. Carotid US (10/15):  Bilateral ICA 1-39%  . Coronary artery disease    a. NSTEMI >> LHC (02/11/14):  pLAD 95%, mLAD occl, CFX < 20%, pRCA 30%, EF 35%, ant and apical AK >> CABG  . Glucose intolerance (impaired glucose tolerance)    a. A1c 6.1 (01/2014)  . H/O non-ST elevation myocardial infarction (NSTEMI)    01/2014  . HLD (hyperlipidemia)   . Hypertension   . Ischemic cardiomyopathy    a. EF 35% by cath at time of NSTEMI >> b. Echo (10/15):  EF 50% to 55%. Wall motion was normal; Grade 1 diastolic dysfunction.  Mild AI.  Ascending aorta mildly dilated. Mild MR.  Mild LAE.  PA peak pressure: 46 mm Hg (S).   Past Surgical History:  Procedure Laterality Date  . CESAREAN SECTION    . CORONARY ARTERY BYPASS GRAFT N/A 02/15/2014   Procedure: CORONARY ARTERY BYPASS GRAFTING (CABG) x three,  using left internal mammary artery and right leg greater saphenous vein harvested endoscopically;  Surgeon: Alleen Borne, MD;  Location: [facility] OR;  Service: Open Heart Surgery;  Laterality: N/A;  . LEFT HEART CATHETERIZATION WITH CORONARY ANGIOGRAM N/A 02/11/2014   Procedure: LEFT HEART CATHETERIZATION WITH CORONARY ANGIOGRAM;  Surgeon: Peter M Swaziland, MD;  Location: [facility] CATH LAB;  Service: Cardiovascular;  Laterality: N/A;  . TEE WITHOUT CARDIOVERSION N/A 02/15/2014   Procedure: TRANSESOPHAGEAL ECHOCARDIOGRAM (TEE);  Surgeon: Alleen Borne, MD;  Location: [facility] OR;  Service: Open Heart Surgery;  Laterality: N/A;    No Known Allergies  Allergies as of 05/21/2019   No Known Allergies     Medication List       Accurate as of May 21, 2019 11:09 AM. If you have any questions, ask your nurse or doctor.        acetaminophen 500 MG tablet Commonly known as: TYLENOL Take 1,000 mg by mouth every 6 (six) hours as needed (pain).   ALPRAZolam 0.25 MG tablet Commonly known as:  XANAX Take 0.25 mg by mouth at bedtime as needed for anxiety.   amLODipine 5 MG tablet Commonly known as: NORVASC Take 5 mg by mouth daily.   aspirin 325 MG EC tablet Take 325 mg by mouth daily. What changed: Another medication with the same name was removed. Continue taking this medication, and follow the directions you see here. Changed by: Caesar Bookman, NP   atorvastatin 40 MG tablet Commonly known as: LIPITOR TAKE 1 TABLET (40 MG TOTAL) BY MOUTH DAILY.   B-Complex-C Caps Take 1 capsule by mouth daily.   busPIRone 30 MG tablet Commonly known as: BUSPAR Take 15-30 mg by mouth 2 (two) times daily.   Calcium-Magnesium-Vitamin D 600-40-500 MG-MG-UNIT Tb24 Take 1 tablet by mouth daily.   Eszopiclone 3 MG Tabs Take 3 mg by mouth at bedtime as needed.   ezetimibe 10 MG tablet Commonly known as: ZETIA Take 10 mg by mouth daily.   latanoprost 0.005 % ophthalmic solution Commonly known as: XALATAN 1 drop at bedtime.   levothyroxine 100 MCG tablet Commonly known as: SYNTHROID Take 100 mcg by mouth daily before breakfast.   omega-3 acid ethyl esters 1 g capsule Commonly known as: LOVAZA Take 1 g by  mouth daily.   Vitamin D3 50 MCG (2000 UT) Tabs Take 2,000 Units by mouth daily.       Review of Systems  Unable to perform ROS: Dementia (information provided by daughter and son )  Constitutional: Positive for appetite change. Negative for chills, fatigue and fever.       Eats meals twice daily   HENT: Negative for congestion, rhinorrhea, sinus pressure, sinus pain, sneezing, sore throat and trouble swallowing.        HOH  wears dentures   Eyes: Positive for visual disturbance. Negative for discharge, redness and itching.       Right eye blind.  Respiratory: Negative for cough, chest tightness, shortness of breath and wheezing.   Cardiovascular: Negative for chest pain, palpitations and leg swelling.  Gastrointestinal: Positive for constipation. Negative for  abdominal distention, abdominal pain, diarrhea, nausea and vomiting.       Lots of veggies to help with constipation   Endocrine: Negative for heat intolerance, polydipsia, polyphagia and polyuria.       Hx of Hypothyroidism   Genitourinary: Negative for difficulty urinating, dysuria, flank pain, frequency and urgency.  Musculoskeletal: Negative for arthralgias, gait problem and myalgias.  Skin: Negative for color change, pallor, rash and wound.  Neurological: Negative for dizziness, weakness, light-headedness, numbness and headaches.       Problems getting her words out except when confused   Hematological: Does not bruise/bleed easily.  Psychiatric/Behavioral: Positive for agitation, behavioral problems, confusion and sleep disturbance. The patient is nervous/anxious.        Hx dementia December,2019     Immunization History  Administered Date(s) Administered  . Pneumococcal Conjugate-13 01/31/2012   Pertinent  Health Maintenance Due  Topic Date Due  . PNA vac Low Risk Adult (2 of 2 - PPSV23) 01/30/2013  . INFLUENZA VACCINE  12/02/2018  . DEXA SCAN  Completed   Fall Risk  05/21/2019  Falls in the past year? 1  Number falls in past yr: 0  Injury with Fall? 1    Vitals:   05/21/19 1047  BP: 122/68  Pulse: (!) 46  Temp: 97.8 F (36.6 C)  TempSrc: Oral  SpO2: 97%  Weight: 140 lb (63.5 kg)  Height: 4\' 9"  (1.448 m)   Body mass index is 30.3 kg/m. Physical Exam Vitals reviewed.  Constitutional:      General: She is not in acute distress.    Appearance: She is obese. She is not ill-appearing.  HENT:     Head: Normocephalic.     Right Ear: Tympanic membrane, ear canal and external ear normal. There is no impacted cerumen.     Left Ear: Tympanic membrane, ear canal and external ear normal. There is no impacted cerumen.     Nose: Nose normal. No congestion or rhinorrhea.     Mouth/Throat:     Mouth: Mucous membranes are moist.     Pharynx: Oropharynx is clear. No  oropharyngeal exudate or posterior oropharyngeal erythema.  Eyes:     General: No scleral icterus.       Right eye: No discharge.        Left eye: No discharge.     Extraocular Movements: Extraocular movements intact.     Conjunctiva/sclera: Conjunctivae normal.     Pupils: Pupils are equal, round, and reactive to light.  Neck:     Vascular: No carotid bruit.  Cardiovascular:     Rate and Rhythm: Regular rhythm. Bradycardia present.     Heart sounds: Murmur  present. Systolic murmur present with a grade of 2/6. Diastolic murmur present with a grade of 2/4. No friction rub. No gallop.   Pulmonary:     Effort: Pulmonary effort is normal. No respiratory distress.     Breath sounds: Normal breath sounds. No wheezing, rhonchi or rales.  Chest:     Chest wall: No tenderness.  Abdominal:     General: Bowel sounds are normal. There is no distension.     Palpations: Abdomen is soft. There is no mass.     Tenderness: There is no abdominal tenderness. There is no right CVA tenderness, left CVA tenderness, guarding or rebound.  Musculoskeletal:        General: No swelling or tenderness. Normal range of motion.     Cervical back: Normal range of motion. No rigidity or tenderness.     Right lower leg: No edema.     Left lower leg: No edema.  Lymphadenopathy:     Cervical: No cervical adenopathy.  Skin:    General: Skin is warm and dry.     Coloration: Skin is not pale.     Findings: No bruising, erythema, lesion or rash.  Neurological:     Mental Status: She is alert. She is confused.     Cranial Nerves: No cranial nerve deficit.     Motor: No weakness.     Coordination: Coordination normal.     Gait: Gait normal.     Comments: Disoriented to year- states 1908,season,date,day and month.unable to states her current location city/countyScored 8/30 on MMSE today.    Psychiatric:        Mood and Affect: Mood normal.        Speech: Speech normal.        Behavior: Behavior normal.         Thought Content: Thought content normal.        Cognition and Memory: Cognition is impaired. Memory is impaired.        Judgment: Judgment normal.    Labs reviewed: No results for input(s): NA, K, CL, CO2, GLUCOSE, BUN, CREATININE, CALCIUM, MG, PHOS in the last 8760 hours. No results for input(s): AST, ALT, ALKPHOS, BILITOT, PROT, ALBUMIN in the last 8760 hours. No results for input(s): WBC, NEUTROABS, HGB, HCT, MCV, PLT in the last 8760 hours. Lab Results  Component Value Date   TSH 2.530 02/11/2014   Lab Results  Component Value Date   HGBA1C 6.1 (H) 02/11/2014   Lab Results  Component Value Date   CHOL 125 05/01/2014   HDL 47.60 05/01/2014   LDLCALC 65 05/01/2014   TRIG 61.0 05/01/2014   CHOLHDL 3 05/01/2014    Significant Diagnostic Results in last 30 days:  No results found.  Assessment/Plan 1. Essential hypertension B/p at goal today.continue on amlodipine 5 mg tablet daily.on ASA 325 mg tablet daily for cardiovascular event prevention. Labs recently drawn by previous PCP.Awaiting records for review. Daughter advised to sign release of medical records with receptionist verbalized understanding.   2. Mixed hyperlipidemia Continue on atorvastatin 40 mg tablet daily and Ezetimibe 10 mg tablet daily.No labs for review awaiting records.  3. Acquired hypothyroidism Levothyroxine recently reduced by Dr.Shelton on  05/10/2019  from 100 mcg to 75 mcg tablet daily.continue to encourage to take levothyroxine on empty stomach.Daughter reports skips dose or gives medication with meals depending on patient's mood.  Will recheck TSH level in 8 weeks.   4. Generalized anxiety disorder Continue on Alprazolam 0.25 mg tablet Takes 2  Tablet three times daily and Buspirone 15-30 mg tablet twice daily.Daughter sometimes gives only 15 mg tablet twice daily due to increase sleepiness and tendency to be more confused when she takes 30 mg tablet.Will await medical records.will attempt to wean off  Buspirone and  Alprazolam and try on an SSRI due her advance age and high risk for falls.   5. Insomnia, unspecified type Continue on Eszopiclone 3 mg tablet at bedtime as needed   6. Vitamin D deficiency Continue on vitamin D 2000 units daily.   7. Dementia with behavioral disturbance, unspecified dementia type (HCC) Calm and co-operative during visit.Continue on Alprazolam 0.25 mg tablet Takes 2 Tablet three times daily and Buspirone 15-30 mg tablet twice daily.combative at times.Daughter and son request Home health Nurse assistance to assist with patient's ADL's.Discuss option to place in facility if behavioral disturbance worsen.will consider weaning off Alprazolam and Buspirone and consider SSRI or Seroquel for behavioral disturbance.continue with supportive care for now. Home Health referral for Nurse assistance placed.  8. Coronary artery disease due to lipid rich plaque S/p CABG x 3 2015.chest pain free.continue on ASA 325 mg tablet daily,atorvastatin 40 mg tablet daily and Ezetimibe 10 mg tablet daily.dietary and lifestyle modification advised.exercises by walking on the hallway at home.    9. Need for influenza vaccination Afebrile.No signs of URI's. - Flu Vaccine QUAD High Dose(Fluad) administered by CMA   10. Need for 23-polyvalent pneumococcal polysaccharide vaccine Afebrile. - Pneumococcal polysaccharide vaccine 23-valent greater than or equal to 2yo subcutaneous/IM administered by CMA  11. Need for Tdap vaccination Advised to get Tdap vaccine at her pharmacy.script send to pharmacy today.  - Tdap (BOOSTRIX) 5-2.5-18.5 LF-MCG/0.5 injection; Inject 0.5 mLs into the muscle once for 1 dose.  Dispense: 0.5 mL; Refill: 0  12. Class 1 obesity due to excess calories without serious comorbidity with body mass index (BMI) of 30.0 to 30.9 in adult BMI 30.3 Advised to continue with dietary and lifestyle modification   Family/ staff Communication: Reviewed plan of care with  patient,daughter and son.  Labs/tests ordered: Awaiting medical records per daughter several labs drawn recently by previous PCP.   Next Appointment : 8 weeks for TSH level check.4  Months for medical management of chronic issues.   Caesar Bookman, NP

## 2019-05-22 ENCOUNTER — Other Ambulatory Visit: Payer: Self-pay | Admitting: Family

## 2019-05-22 DIAGNOSIS — E039 Hypothyroidism, unspecified: Secondary | ICD-10-CM

## 2019-06-10 ENCOUNTER — Ambulatory Visit: Payer: Medicare PPO | Attending: Internal Medicine

## 2019-06-10 DIAGNOSIS — Z23 Encounter for immunization: Secondary | ICD-10-CM | POA: Insufficient documentation

## 2019-06-10 NOTE — Progress Notes (Signed)
   Covid-19 Vaccination Clinic  Name:  [name] [name]    MRN: [id number] DOB: 1932-08-01  06/10/2019  [name] [name] was observed post Covid-19 immunization for 15 minutes without incidence. She was provided with Vaccine Information Sheet and instruction to access the V-Safe system.   [name] [name] was instructed to call 911 with any severe reactions post vaccine: Marland Kitchen Difficulty breathing  . Swelling of your face and throat  . A fast heartbeat  . A bad rash all over your body  . Dizziness and weakness    Immunizations Administered    Name Date Dose VIS Date Route   Pfizer COVID-19 Vaccine 06/10/2019  4:47 PM 0.3 mL 04/13/2019 Intramuscular   Manufacturer: [employer], [employer]   Lot: [id number]   NDC: [address]-9

## 2019-07-01 ENCOUNTER — Ambulatory Visit: Payer: Medicare Other

## 2019-07-05 ENCOUNTER — Ambulatory Visit: Payer: Medicare PPO | Attending: Internal Medicine

## 2019-07-05 DIAGNOSIS — Z23 Encounter for immunization: Secondary | ICD-10-CM | POA: Insufficient documentation

## 2019-07-05 NOTE — Progress Notes (Signed)
   Covid-19 Vaccination Clinic  Name:  [name] [name]    MRN: [id number] DOB: Jan 08, 1933  07/05/2019  [name] [name] was observed post Covid-19 immunization for 15 minutes without incident. She was provided with Vaccine Information Sheet and instruction to access the V-Safe system.   [name] [name] was instructed to call 911 with any severe reactions post vaccine: Marland Kitchen Difficulty breathing  . Swelling of face and throat  . A fast heartbeat  . A bad rash all over body  . Dizziness and weakness   Immunizations Administered    Name Date Dose VIS Date Route   Pfizer COVID-19 Vaccine 07/05/2019 12:43 PM 0.3 mL 04/13/2019 Intramuscular   Manufacturer: [employer], [employer]   Lot: [id number]   NDC: [address]-4

## 2019-07-16 ENCOUNTER — Other Ambulatory Visit: Payer: Self-pay

## 2019-07-16 ENCOUNTER — Other Ambulatory Visit: Payer: Medicare PPO

## 2019-07-16 DIAGNOSIS — E039 Hypothyroidism, unspecified: Secondary | ICD-10-CM

## 2019-07-17 LAB — TSH: TSH: 36.76 mIU/L — ABNORMAL HIGH (ref 0.40–4.50)

## 2019-07-19 ENCOUNTER — Telehealth: Payer: Self-pay | Admitting: *Deleted

## 2019-07-19 DIAGNOSIS — E039 Hypothyroidism, unspecified: Secondary | ICD-10-CM

## 2019-07-19 MED ORDER — LEVOTHYROXINE SODIUM 88 MCG PO TABS: 88.0000 ug | ORAL_TABLET | Freq: Every day | ORAL | 1 refills | Status: DC

## 2019-07-19 NOTE — Telephone Encounter (Signed)
[name], NP  07/18/2019 4:41 PM EDT    Yes this is fine as long has its consistent- lets have pt increase levothyroxine to 88 mcg and follow up TSH in 8 weeks.    Sueanne Margarita, CMA  07/18/2019 4:16 PM EDT    Spoke with daughter and she's been having a hard time giving pt the medication on a empty stomach, she's giving it to her when she can. Per daughter the previous provider just instructed her to give it to her when she can. Pt has dementia and will not take on empty stomach. Do you still want to increase?   Sueanne Margarita, CMA  07/17/2019 10:12 AM EDT    .left message to have patient return my call.   [name], Dinah C, NP  07/17/2019 8:54 AM EDT    TSH level are high have you missed levothyroxine doses?Still having trouble taking medication on an empty stomach? If no dosage missed and taking medication on empty stomach will need to increase dose of levothyroxine from 75 mcg tablet to 88 mcg tablet daily on an empty stomach then recheck TSH level in 8 weeks.

## 2019-07-19 NOTE — Telephone Encounter (Signed)
Spoke with daughter and advised results. Lab appt scheduled rx sent to pharmacy by e-script

## 2019-09-11 ENCOUNTER — Other Ambulatory Visit: Payer: Self-pay

## 2019-09-11 ENCOUNTER — Other Ambulatory Visit: Payer: Medicare PPO

## 2019-09-11 ENCOUNTER — Telehealth: Payer: Self-pay | Admitting: Cardiology

## 2019-09-11 DIAGNOSIS — E039 Hypothyroidism, unspecified: Secondary | ICD-10-CM

## 2019-09-11 NOTE — Telephone Encounter (Signed)
New message  Patient's daughter is calling in to get approval for her or her brother to accompany patient to her appointment. States that patient has dementia and needs someone with her at all times. Please call and confirm.

## 2019-09-11 NOTE — Telephone Encounter (Signed)
Called back and spoke with [name] (pt's daughter).  OK for 1 person to come with pt for her appt 10/02/2019.  She states understanding and thanked me for the c/b and information.

## 2019-09-12 ENCOUNTER — Other Ambulatory Visit: Payer: Self-pay

## 2019-09-12 DIAGNOSIS — E039 Hypothyroidism, unspecified: Secondary | ICD-10-CM

## 2019-09-12 LAB — TSH: TSH: 8.45 mIU/L — ABNORMAL HIGH (ref 0.40–4.50)

## 2019-09-12 MED ORDER — LEVOTHYROXINE SODIUM 100 MCG PO TABS: 100.0000 ug | ORAL_TABLET | Freq: Every day | ORAL | 0 refills | Status: DC

## 2019-09-18 ENCOUNTER — Ambulatory Visit (INDEPENDENT_AMBULATORY_CARE_PROVIDER_SITE_OTHER): Payer: Medicare PPO | Admitting: Family

## 2019-09-18 ENCOUNTER — Other Ambulatory Visit: Payer: Self-pay

## 2019-09-18 ENCOUNTER — Encounter: Payer: Self-pay | Admitting: Family

## 2019-09-18 VITALS — BP 110/80 | HR 57 | Temp 97.3°F | Resp 16 | Ht <= 58 in | Wt 144.2 lb

## 2019-09-18 DIAGNOSIS — I10 Essential (primary) hypertension: Secondary | ICD-10-CM | POA: Diagnosis not present

## 2019-09-18 DIAGNOSIS — Z23 Encounter for immunization: Secondary | ICD-10-CM

## 2019-09-18 DIAGNOSIS — F0391 Unspecified dementia with behavioral disturbance: Secondary | ICD-10-CM

## 2019-09-18 DIAGNOSIS — F411 Generalized anxiety disorder: Secondary | ICD-10-CM

## 2019-09-18 DIAGNOSIS — E782 Mixed hyperlipidemia: Secondary | ICD-10-CM

## 2019-09-18 DIAGNOSIS — G4700 Insomnia, unspecified: Secondary | ICD-10-CM

## 2019-09-18 DIAGNOSIS — E039 Hypothyroidism, unspecified: Secondary | ICD-10-CM

## 2019-09-18 MED ORDER — TETANUS-DIPHTH-ACELL PERTUSSIS 5-2.5-18.5 LF-MCG/0.5 IM SUSP: 0.5000 mL | Freq: Once | INTRAMUSCULAR | 0 refills | Status: AC

## 2019-09-18 MED ORDER — MELATONIN 3 MG PO TABS: 3.0000 mg | ORAL_TABLET | Freq: Every day | ORAL | 3 refills | Status: AC

## 2019-09-18 MED ORDER — ESZOPICLONE 3 MG PO TABS: 3.0000 mg | ORAL_TABLET | Freq: Every evening | ORAL | 3 refills | Status: DC | PRN

## 2019-09-18 NOTE — Patient Instructions (Signed)
-   please get Tdap vaccine injection at your pharmacy

## 2019-09-18 NOTE — Progress Notes (Signed)
Provider: Marlowe Sax FNP-C   Kenlea Woodell, Nelda Bucks, NP  Patient Care Team: Galileah Piggee, Nelda Bucks, NP as PCP - General (Family Medicine)  Extended Emergency Contact Information Primary Emergency Contact: Weesner,Robin Address: [address]          [place] [address] [place] of [place] Phone: [phone] Relation: Daughter Secondary Emergency Contact: Donte, Kary Mobile Phone: [phone] Relation: Son  Code Status: Full Code  Goals of care: Advanced Directive information Advanced Directives 09/18/2019  Does Patient Have a Medical Advance Directive? No  Would patient like information on creating a medical advance directive? No - Patient declined     Chief Complaint  Patient presents with  . Medical Management of Chronic Issues    4 Month Follow Up  . Immunizations    Discuss the need for Tetanus Vaccine.     HPI:  Pt is a 86 y.o. female seen today for 4 month follow up for medical management of chronic diseases.she is here escorted by her daughter and son.POA states patient's dementia continues to progress.she gets more confusion in the middle of the day going through drawers.Hard to redirect.she requires assistance with her bathing.care giver request personal care assistance to come home and assist with patient's ADL's.I've discussed with patient's care giver coverage with Insurance for Nurse assistant.Brochures for Home health [facility] provided today. Her appetite described as good.Likes lots of ice cream.No weight loss.Has had no fall episode.  Hypertension - no log brought to visit.Daughter states B/p has been stable at home recalls readings in the 110's/70's.On amlodipine 5 mg tablet daily.No signs of hypotension reported.On ASA 81 mg tablet daily.  Hyperlipidemia - on ezetimibe 10 mg tablet daily.Son states patient likes ice cream especially after taking her medication.  Hypothyroidism - Recent TSH level 8.45 previous 36.76 daughter states now has a regimen giving  levothyroxine at least 30 minutes before food and other medication.on levothyroxine 100 mcg tablet daily.     Genera;lized Anxiety - symptoms worst at the middle of the day.sometimes response well to Alprazolam 0.5 mg tablet three times daily as needed and Buspar 15 mg tablet.does not do well with 30 mg tablet so daughter divides and  gives the 15 mg.   Dementia - Progressive decline reported.Requires assistance with ADL's.Daughter states sometimes patient sees people or things that are not there.   Insomnia - on Eszopiclone 3 mg tablet at bedtime as needed.sometimes medication works and sometimes sleeps a few hours an wakes up.she does not take naps during the day.   Tdap vaccine - script send to pharmacy on previous visit but unclear why didn;t get vaccine.  Code status - discussed code status.Patient's son states would like patient to remain Full code for now will decide on other end of life intervention as patient condition progresses.    Past Medical History:  Diagnosis Date  . Carotid stenosis    a. Carotid US (10/15):  Bilateral ICA 1-39%  . Coronary artery disease    a. NSTEMI >> LHC (02/11/14):  pLAD 95%, mLAD occl, CFX < 20%, pRCA 30%, EF 35%, ant and apical AK >> CABG  . Glucose intolerance (impaired glucose tolerance)    a. A1c 6.1 (01/2014)  . H/O non-ST elevation myocardial infarction (NSTEMI)    01/2014  . HLD (hyperlipidemia)   . Hypertension   . Ischemic cardiomyopathy    a. EF 35% by cath at time of NSTEMI >> b. Echo (10/15):  EF 50% to 55%. Wall motion was normal; Grade 1  diastolic dysfunction.  Mild AI.  Ascending aorta mildly dilated. Mild MR.  Mild LAE.  PA peak pressure: 46 mm Hg (S).   Past Surgical History:  Procedure Laterality Date  . CESAREAN SECTION    . CORONARY ARTERY BYPASS GRAFT N/A 02/15/2014   Procedure: CORONARY ARTERY BYPASS GRAFTING (CABG) x three,  using left internal mammary artery and right leg greater saphenous vein harvested endoscopically;   Surgeon: Gaye Pollack, MD;  Location: [facility];  Service: Open Heart Surgery;  Laterality: N/A;  . LEFT HEART CATHETERIZATION WITH CORONARY ANGIOGRAM N/A 02/11/2014   Procedure: LEFT HEART CATHETERIZATION WITH CORONARY ANGIOGRAM;  Surgeon: Peter M Martinique, MD;  [facility] LAB;  Service: Cardiovascular;  Laterality: N/A;  . TEE WITHOUT CARDIOVERSION N/A 02/15/2014   Procedure: TRANSESOPHAGEAL ECHOCARDIOGRAM (TEE);  Surgeon: Gaye Pollack, MD;  Location: [facility];  Service: Open Heart Surgery;  Laterality: N/A;    No Known Allergies  Allergies as of 09/18/2019   No Known Allergies     Medication List       Accurate as of Sep 18, 2019 10:49 AM. If you have any questions, ask your nurse or doctor.        acetaminophen 500 MG tablet Commonly known as: TYLENOL Take 1,000 mg by mouth every 6 (six) hours as needed (pain).   ALPRAZolam 0.5 MG tablet Commonly known as: XANAX Take 0.5 mg by mouth 3 (three) times daily as needed for anxiety.   amLODipine 5 MG tablet Commonly known as: NORVASC Take 5 mg by mouth daily.   aspirin 81 MG chewable tablet Chew 81 mg by mouth daily. What changed: Another medication with the same name was removed. Continue taking this medication, and follow the directions you see here. Changed by: Sandrea Hughs, NP   B-Complex-C Caps Take 1 capsule by mouth daily.   busPIRone 30 MG tablet Commonly known as: BUSPAR Take 15-30 mg by mouth 2 (two) times daily.   Calcium-Magnesium-Vitamin D 600-40-500 MG-MG-UNIT Tb24 Take 1 tablet by mouth daily.   Eszopiclone 3 MG Tabs Take 3 mg by mouth at bedtime as needed.   ezetimibe 10 MG tablet Commonly known as: ZETIA Take 10 mg by mouth daily.   latanoprost 0.005 % ophthalmic solution Commonly known as: XALATAN 1 drop at bedtime.   levothyroxine 100 MCG tablet Commonly known as: SYNTHROID Take 1 tablet (100 mcg total) by mouth daily before breakfast.   Vitamin D3 50 MCG (2000 UT) Tabs Take 2,000  Units by mouth daily.       Review of Systems  Constitutional: Negative for appetite change, chills, fatigue and fever.  HENT: Negative for congestion, postnasal drip, rhinorrhea, sinus pressure, sinus pain, sneezing, sore throat and trouble swallowing.   Eyes: Positive for visual disturbance. Negative for discharge, redness and itching.  Respiratory: Negative for cough, chest tightness, shortness of breath and wheezing.   Cardiovascular: Negative for chest pain, palpitations and leg swelling.  Gastrointestinal: Negative for abdominal distention, abdominal pain, constipation, diarrhea, nausea and vomiting.  Endocrine: Negative for cold intolerance, heat intolerance, polydipsia, polyphagia and polyuria.  Genitourinary: Negative for difficulty urinating, dysuria, flank pain, frequency and urgency.  Musculoskeletal: Positive for gait problem. Negative for joint swelling and myalgias.       Back pain every once in a while.   Skin: Negative for color change, pallor, rash and wound.  Neurological: Negative for dizziness, syncope, speech difficulty, weakness, light-headedness, numbness and headaches.  Hematological: Does not bruise/bleed easily.  Psychiatric/Behavioral:  Positive for behavioral problems, confusion and sleep disturbance. Negative for agitation, self-injury and suicidal ideas. The patient is nervous/anxious.     Immunization History  Administered Date(s) Administered  . Fluad Quad(high Dose 65+) 05/21/2019  . PFIZER SARS-COV-2 Vaccination 06/10/2019, 07/05/2019  . Pneumococcal Conjugate-13 01/31/2012  . Pneumococcal Polysaccharide-23 05/21/2019   Pertinent  Health Maintenance Due  Topic Date Due  . INFLUENZA VACCINE  12/02/2019  . DEXA SCAN  Completed  . PNA vac Low Risk Adult  Completed   Fall Risk  09/18/2019 05/21/2019  Falls in the past year? 0 1  Number falls in past yr: 0 0  Injury with Fall? 0 1    Vitals:   09/18/19 1034  BP: 110/80  Pulse: (!) 57  Resp: 16   Temp: (!) 97.3 F (36.3 C)  SpO2: 96%  Weight: 144 lb 3.2 oz (65.4 kg)  Height: '4\' 9"'  (1.448 m)   Body mass index is 31.2 kg/m. Physical Exam Vitals reviewed.  Constitutional:      General: She is not in acute distress.    Appearance: She is obese. She is not ill-appearing.  HENT:     Head: Normocephalic.     Right Ear: Tympanic membrane, ear canal and external ear normal. There is no impacted cerumen.     Left Ear: Tympanic membrane, ear canal and external ear normal. There is no impacted cerumen.     Nose: Nose normal. No congestion or rhinorrhea.     Mouth/Throat:     Mouth: Mucous membranes are moist.     Pharynx: Oropharynx is clear. No oropharyngeal exudate or posterior oropharyngeal erythema.  Eyes:     General: No scleral icterus.       Right eye: No discharge.        Left eye: No discharge.     Extraocular Movements: Extraocular movements intact.     Conjunctiva/sclera: Conjunctivae normal.     Pupils: Pupils are equal, round, and reactive to light.  Neck:     Vascular: No carotid bruit.  Cardiovascular:     Rate and Rhythm: Normal rate and regular rhythm.     Pulses: Normal pulses.     Heart sounds: Murmur present. No friction rub. No gallop.   Pulmonary:     Effort: Pulmonary effort is normal. No respiratory distress.     Breath sounds: Normal breath sounds. No wheezing, rhonchi or rales.  Chest:     Chest wall: No tenderness.  Abdominal:     General: Bowel sounds are normal. There is no distension.     Palpations: Abdomen is soft. There is no mass.     Tenderness: There is no abdominal tenderness. There is no right CVA tenderness, left CVA tenderness, guarding or rebound.  Musculoskeletal:     Cervical back: Normal range of motion. No rigidity or tenderness.  Lymphadenopathy:     Cervical: No cervical adenopathy.  Neurological:     Mental Status: She is alert.  Psychiatric:        Mood and Affect: Mood normal.        Speech: Speech normal.         Behavior: Behavior normal.        Thought Content: Thought content normal.        Cognition and Memory: Cognition is impaired. Memory is impaired.     Labs reviewed: Recent Labs    05/14/19 0000  NA 142  K 4.3  CL 103  CO2 25*  BUN 11  CREATININE 1.1  CALCIUM 10.0   Recent Labs    05/14/19 0000  AST 15  ALT 6*  ALKPHOS 61  ALBUMIN 4.1    Lab Results  Component Value Date   TSH 8.45 (H) 09/11/2019   Lab Results  Component Value Date   HGBA1C 6.1 (H) 02/11/2014   Lab Results  Component Value Date   CHOL 200 05/14/2019   HDL 85 (A) 05/14/2019   LDLCALC 65 05/01/2014   TRIG 54 05/14/2019   CHOLHDL 3 05/01/2014    Significant Diagnostic Results in last 30 days:  No results found.  Assessment/Plan 1. Essential hypertension B/p controlled.continue on  amlodipine 5 mg tablet daily.No signs of hypotension reported.continue on  ASA 81 mg tablet daily for prophylaxis. - CBC with Differential/Platelet; Future - CMP with eGFR([facility]); Future  2. Mixed hyperlipidemia Continue on Zetia 10 mg tablet daily and Dietary modification.  - Lipid panel; Future  3. Acquired hypothyroidism Recent TSH level has improved compared to previous.Encouraged to continue on Levothyroxine 100 mcg tablet daily on an empty stomach.  - TSH; Future  4. Generalized anxiety disorder Continue on Alprazolam and Buspar.  5. Dementia with behavioral disturbance, unspecified dementia type ([place]) Continue with supportive care. - Home health Brochure given today for Nurse assistance.POA also advised check out other Agencies around on phone book for CNA. - continue to redirect - Care Giver support Kit package given.   6. Insomnia, unspecified type Continue on Eszopiclone.Add melatonin as needed. - sleep hygiene discussed.  - melatonin 3 MG TABS tablet; Take 1 tablet (3 mg total) by mouth at bedtime.  Dispense: 30 tablet; Refill: 3 - Eszopiclone 3 MG TABS; Take 1 tablet (3 mg total) by mouth at  bedtime as needed.  Dispense: 30 tablet; Refill: 3  7. Need for diphtheria-tetanus-pertussis (Tdap) vaccine Script verified at patient's pharmacy still available for patient.Daughter and son advised to take patient to pharmacy to get Tdap vaccine  - Tdap vaccine greater than or equal to 7yo IM  Family/ staff Communication: Reviewed plan of care with patient.son and Daughter.   Labs/tests ordered:  - TSH; Future - CBC with Differential/Platelet; Future - CMP with eGFR([facility]); Future - Lipid panel; Future  Next Appointment : 6 months for medical management of chronic issues.Fasting labs 2-4 days prior to visit.   Sandrea Hughs, NP

## 2019-09-21 ENCOUNTER — Encounter: Payer: Self-pay | Admitting: Nurse Practitioner

## 2019-09-21 ENCOUNTER — Other Ambulatory Visit: Payer: Self-pay

## 2019-09-21 ENCOUNTER — Ambulatory Visit (INDEPENDENT_AMBULATORY_CARE_PROVIDER_SITE_OTHER): Payer: Medicare PPO | Admitting: Nurse Practitioner

## 2019-09-21 VITALS — BP 140/80 | HR 60 | Temp 97.7°F | Ht <= 58 in | Wt 145.0 lb

## 2019-09-21 DIAGNOSIS — R410 Disorientation, unspecified: Secondary | ICD-10-CM | POA: Diagnosis not present

## 2019-09-21 DIAGNOSIS — F0391 Unspecified dementia with behavioral disturbance: Secondary | ICD-10-CM | POA: Diagnosis not present

## 2019-09-21 LAB — POCT URINALYSIS DIPSTICK
Bilirubin, UA: NEGATIVE
Blood, UA: NEGATIVE
Glucose, UA: NEGATIVE
Ketones, UA: NEGATIVE
Nitrite, UA: NEGATIVE
Protein, UA: NEGATIVE
Spec Grav, UA: 1.01 (ref 1.010–1.025)
Urobilinogen, UA: 0.2 E.U./dL
pH, UA: 6 (ref 5.0–8.0)

## 2019-09-21 NOTE — Addendum Note (Signed)
Addended by: Maurice Small on: 09/21/2019 04:37 PM   Modules accepted: Orders

## 2019-09-21 NOTE — Progress Notes (Signed)
w   Careteam: Patient Care Team: [name], Nelda Bucks, NP as PCP - General (Family Medicine)  PLACE OF SERVICE:  [facility] Directive information    No Known Allergies  Chief Complaint  Patient presents with  . Acute Visit    Possible UTI PT. here with son Roselyn Reef     HPI: Patient is a 86 y.o. female here today with concerns of UTI.  Pt with dementia and hard to explain.  She has reported back pain in the past few days. Reports low back pain. None today.  Increase in anxiety over the last few weeks and being combative.  which in the past she had a UTI. Son says in retrospect the anxiety has always increase with UTI.  She is a poor historian. No urinary incontinence.   She had diarrhea last week, she had excessive ice cream and felt like that was contributing. Diarrhea improved once ice cream was taken away. Had several episodes of incontinence of bowel.  Having a hard time keeping her clean   Review of Systems:  Review of Systems  Unable to perform ROS: Dementia    Past Medical History:  Diagnosis Date  . Carotid stenosis    a. Carotid US (10/15):  Bilateral ICA 1-39%  . Coronary artery disease    a. NSTEMI >> LHC (02/11/14):  pLAD 95%, mLAD occl, CFX < 20%, pRCA 30%, EF 35%, ant and apical AK >> CABG  . Glucose intolerance (impaired glucose tolerance)    a. A1c 6.1 (01/2014)  . H/O non-ST elevation myocardial infarction (NSTEMI)    01/2014  . HLD (hyperlipidemia)   . Hypertension   . Ischemic cardiomyopathy    a. EF 35% by cath at time of NSTEMI >> b. Echo (10/15):  EF 50% to 55%. Wall motion was normal; Grade 1 diastolic dysfunction.  Mild AI.  Ascending aorta mildly dilated. Mild MR.  Mild LAE.  PA peak pressure: 46 mm Hg (S).   Past Surgical History:  Procedure Laterality Date  . CESAREAN SECTION    . CORONARY ARTERY BYPASS GRAFT N/A 02/15/2014   Procedure: CORONARY ARTERY BYPASS GRAFTING (CABG) x three,  using left internal mammary artery and right leg  greater saphenous vein harvested endoscopically;  Surgeon: Gaye Pollack, MD;  Location: [facility];  Service: Open Heart Surgery;  Laterality: N/A;  . LEFT HEART CATHETERIZATION WITH CORONARY ANGIOGRAM N/A 02/11/2014   Procedure: LEFT HEART CATHETERIZATION WITH CORONARY ANGIOGRAM;  Surgeon: Peter M Martinique, MD;  Location: [facility] CATH LAB;  Service: Cardiovascular;  Laterality: N/A;  . TEE WITHOUT CARDIOVERSION N/A 02/15/2014   Procedure: TRANSESOPHAGEAL ECHOCARDIOGRAM (TEE);  Surgeon: Gaye Pollack, MD;  Location: [facility];  Service: Open Heart Surgery;  Laterality: N/A;   Social History:   reports that she has never smoked. She has never used smokeless tobacco. She reports that she does not drink alcohol or use drugs.  Family History  Problem Relation Age of Onset  . Stroke Mother   . Hypertension Mother   . Hypertension Sister   . Diabetes Brother   . Diabetes Sister   . Heart attack Neg Hx   . Breast cancer Neg Hx     Medications: Patient's Medications  New Prescriptions   No medications on file  Previous Medications   ACETAMINOPHEN (TYLENOL) 500 MG TABLET    Take 1,000 mg by mouth every 6 (six) hours as needed (pain).   ALPRAZOLAM (XANAX) 0.5 MG TABLET    Take 0.5  mg by mouth 3 (three) times daily as needed for anxiety.    AMLODIPINE (NORVASC) 5 MG TABLET    Take 5 mg by mouth daily.   ASPIRIN 81 MG CHEWABLE TABLET    Chew 81 mg by mouth daily.   B-COMPLEX-C CAPS    Take 1 capsule by mouth daily.   BUSPIRONE (BUSPAR) 30 MG TABLET    Take 15-30 mg by mouth 2 (two) times daily.    CALCIUM-MAGNESIUM-VITAMIN D 960-45-409 MG-MG-UNIT TB24    Take 1 tablet by mouth daily.   CHOLECALCIFEROL (VITAMIN D3) 2000 UNITS TABS    Take 2,000 Units by mouth daily.   ESZOPICLONE 3 MG TABS    Take 1 tablet (3 mg total) by mouth at bedtime as needed.   EZETIMIBE (ZETIA) 10 MG TABLET    Take 10 mg by mouth daily.   LATANOPROST (XALATAN) 0.005 % OPHTHALMIC SOLUTION    1 drop at bedtime.   LEVOTHYROXINE  (SYNTHROID) 100 MCG TABLET    Take 1 tablet (100 mcg total) by mouth daily before breakfast.   MELATONIN 3 MG TABS TABLET    Take 1 tablet (3 mg total) by mouth at bedtime.  Modified Medications   No medications on file  Discontinued Medications   No medications on file    Physical Exam:  Vitals:   09/21/19 1450  BP: 140/80  Pulse: 60  Temp: 97.7 F (36.5 C)  TempSrc: Temporal  SpO2: 98%  Weight: 145 lb (65.8 kg)  Height: _0  (1.448 m)   Body mass index is 31.38 kg/m. Wt Readings from Last 3 Encounters:  09/21/19 145 lb (65.8 kg)  09/18/19 144 lb 3.2 oz (65.4 kg)  05/21/19 140 lb (63.5 kg)    Physical Exam Constitutional:      General: She is not in acute distress.    Appearance: She is well-developed. She is not diaphoretic.  HENT:     Head: Normocephalic and atraumatic.     Mouth/Throat:     Pharynx: No oropharyngeal exudate.  Eyes:     Conjunctiva/sclera: Conjunctivae normal.     Pupils: Pupils are equal, round, and reactive to light.  Cardiovascular:     Rate and Rhythm: Normal rate and regular rhythm.     Heart sounds: Normal heart sounds.  Pulmonary:     Effort: Pulmonary effort is normal.     Breath sounds: Normal breath sounds.  Abdominal:     General: Bowel sounds are normal.     Palpations: Abdomen is soft.     Tenderness: There is no abdominal tenderness. There is no right CVA tenderness or left CVA tenderness.  Musculoskeletal:        General: No tenderness.     Cervical back: Normal, normal range of motion and neck supple.     Thoracic back: Normal. No tenderness.     Lumbar back: Normal. No tenderness.     Right lower leg: No edema.     Left lower leg: No edema.  Skin:    General: Skin is warm and dry.  Neurological:     Mental Status: She is alert and oriented to person, place, and time.     Labs reviewed: Basic Metabolic Panel: Recent Labs    05/14/19 0000 07/16/19 1052 09/11/19 1008  NA 142  --   --   K 4.3  --   --   CL 103   --   --   CO2 25*  --   --  BUN 11  --   --   CREATININE 1.1  --   --   CALCIUM 10.0  --   --   TSH  --  36.76* 8.45*   Liver Function Tests: Recent Labs    05/14/19 0000  AST 15  ALT 6*  ALKPHOS 61  ALBUMIN 4.1   No results for input(s): LIPASE, AMYLASE in the last 8760 hours. No results for input(s): AMMONIA in the last 8760 hours. CBC: No results for input(s): WBC, NEUTROABS, HGB, HCT, MCV, PLT in the last 8760 hours. Lipid Panel: Recent Labs    05/14/19 0000  CHOL 200  HDL 85*  TRIG 54   TSH: Recent Labs    07/16/19 1052 09/11/19 1008  TSH 36.76* 8.45*   A1C: Lab Results  Component Value Date   HGBA1C 6.1 (H) 02/11/2014     Assessment/Plan 1. Dementia with behavioral disturbance, unspecified dementia type ([place]) -progressive memory loss, recently worse but family thinks this could be related to infection. Will check urine and lab work at this time.  They also are having a hard time keeping her clean (which can predispose her to UTI) due to her dementia. Encouraged to continue to work on routine pericare to prevent infection.   2. Delirium -pt with recent episodes of diarrhea with incontinence, now more confused and combative, in the past has been consistent with UTI and would like to be evaluated for this. She is a poor historian.  -will get UA C&S - BMP with eGFR([facility]) - CBC with Differential/Platelet  Follow up precautions given.  Carlos American. [place], [facility] [phone]

## 2019-09-22 ENCOUNTER — Encounter: Payer: Self-pay | Admitting: Nurse Practitioner

## 2019-09-22 LAB — CBC WITH DIFFERENTIAL/PLATELET
Absolute Monocytes: 390 cells/uL (ref 200–950)
Basophils Absolute: 51 cells/uL (ref 0–200)
Basophils Relative: 0.8 %
Eosinophils Absolute: 77 cells/uL (ref 15–500)
Eosinophils Relative: 1.2 %
HCT: 38.3 % (ref 35.0–45.0)
Hemoglobin: 12.3 g/dL (ref 11.7–15.5)
Lymphs Abs: 2221 cells/uL (ref 850–3900)
MCH: 28.7 pg (ref 27.0–33.0)
MCHC: 32.1 g/dL (ref 32.0–36.0)
MCV: 89.3 fL (ref 80.0–100.0)
MPV: 8.9 fL (ref 7.5–12.5)
Monocytes Relative: 6.1 %
Neutro Abs: 3661 cells/uL (ref 1500–7800)
Neutrophils Relative %: 57.2 %
Platelets: 221 10*3/uL (ref 140–400)
RBC: 4.29 10*6/uL (ref 3.80–5.10)
RDW: 13.6 % (ref 11.0–15.0)
Total Lymphocyte: 34.7 %
WBC: 6.4 10*3/uL (ref 3.8–10.8)

## 2019-09-22 LAB — URINE CULTURE
MICRO NUMBER:: 10509385
Result:: NO GROWTH
SPECIMEN QUALITY:: ADEQUATE

## 2019-09-22 LAB — BASIC METABOLIC PANEL WITH GFR
BUN/Creatinine Ratio: 17 (calc) (ref 6–22)
BUN: 16 mg/dL (ref 7–25)
CO2: 28 mmol/L (ref 20–32)
Calcium: 10.4 mg/dL (ref 8.6–10.4)
Chloride: 103 mmol/L (ref 98–110)
Creat: 0.95 mg/dL — ABNORMAL HIGH (ref 0.60–0.88)
GFR, Est African American: 63 mL/min/{1.73_m2} (ref >=60)
GFR, Est Non African American: 55 mL/min/{1.73_m2} — ABNORMAL LOW (ref >=60)
Glucose, Bld: 90 mg/dL (ref 65–139)
Potassium: 4.1 mmol/L (ref 3.5–5.3)
Sodium: 137 mmol/L (ref 135–146)

## 2019-09-28 NOTE — Progress Notes (Signed)
Cardiology Office Note:    Date:  10/02/2019   ID:  [name] [name], DOB 05-Oct-1932, MRN [id number]  PCP:  Caesar Bookman, NP  Cardiologist:  Donato Schultz, MD  Electrophysiologist:  None   Referring MD: Caesar Bookman, NP     History of Present Illness:    [name] [name] is a 86 y.o. female here for follow up of CAD post CABG 2015. Back in October 2015 had non-ST elevation myocardial infarction where EF was 35%, follow-up echo normalized.  [name] performed CABG LIMA to LAD, SVG to D1, SVG to D2.  She did go to the emergency department on 01/13/2018 with nonspecific chest pain.  Sinus bradycardia was noted on ECG without any other ischemic changes, personally reviewed.  Start about 12 hours before presentation.  Nonexertional.  Aspirin provided some relief.  No vomiting, troponin was normal.    Since her last visit about 2 years ago, her dementia has increased.  She lives with her daughter.  Her son helps her out consistently.  He is able to drive her around.  She would like it in the car without him in it.  He states that when observing her, she is not having any anginal symptoms no chest pain no shortness of breath.  She is now down to the 81 mg of aspirin.  They are trying to figure out a way to help her sleep more soundly through the night.   Past Medical History:  Diagnosis Date   Carotid stenosis    a. Carotid US (10/15):  Bilateral ICA 1-39%   Coronary artery disease    a. NSTEMI >> LHC (02/11/14):  pLAD 95%, mLAD occl, CFX < 20%, pRCA 30%, EF 35%, ant and apical AK >> CABG   Glucose intolerance (impaired glucose tolerance)    a. A1c 6.1 (01/2014)   H/O non-ST elevation myocardial infarction (NSTEMI)    01/2014   HLD (hyperlipidemia)    Hypertension    Ischemic cardiomyopathy    a. EF 35% by cath at time of NSTEMI >> b. Echo (10/15):  EF 50% to 55%. Wall motion was normal; Grade 1 diastolic dysfunction.  Mild AI.  Ascending aorta mildly dilated. Mild MR.  Mild LAE.   PA peak pressure: 46 mm Hg (S).    Past Surgical History:  Procedure Laterality Date   CESAREAN SECTION     CORONARY ARTERY BYPASS GRAFT N/A 02/15/2014   Procedure: CORONARY ARTERY BYPASS GRAFTING (CABG) x three,  using left internal mammary artery and right leg greater saphenous vein harvested endoscopically;  Surgeon: Alleen Borne, MD;  Location: [facility] OR;  Service: Open Heart Surgery;  Laterality: N/A;   LEFT HEART CATHETERIZATION WITH CORONARY ANGIOGRAM N/A 02/11/2014   Procedure: LEFT HEART CATHETERIZATION WITH CORONARY ANGIOGRAM;  Surgeon: Peter M Swaziland, MD;  Location: [facility] CATH LAB;  Service: Cardiovascular;  Laterality: N/A;   TEE WITHOUT CARDIOVERSION N/A 02/15/2014   Procedure: TRANSESOPHAGEAL ECHOCARDIOGRAM (TEE);  Surgeon: Alleen Borne, MD;  Location: [facility] OR;  Service: Open Heart Surgery;  Laterality: N/A;    Current Medications: Current Meds  Medication Sig   acetaminophen (TYLENOL) 500 MG tablet Take 1,000 mg by mouth every 6 (six) hours as needed (pain).   ALPRAZolam (XANAX) 0.5 MG tablet Take 0.5 mg by mouth 3 (three) times daily as needed for anxiety.    amLODipine (NORVASC) 5 MG tablet Take 5 mg by mouth daily.   aspirin 81 MG chewable tablet Chew 81 mg by mouth daily.  B-Complex-C CAPS Take 1 capsule by mouth daily.   busPIRone (BUSPAR) 30 MG tablet Take 15-30 mg by mouth 2 (two) times daily.    Calcium-Magnesium-Vitamin D 600-40-500 MG-MG-UNIT TB24 Take 1 tablet by mouth daily.   Cholecalciferol (VITAMIN D3) 2000 UNITS TABS Take 2,000 Units by mouth daily.   Eszopiclone 3 MG TABS Take 1 tablet (3 mg total) by mouth at bedtime as needed.   ezetimibe (ZETIA) 10 MG tablet Take 10 mg by mouth daily.   latanoprost (XALATAN) 0.005 % ophthalmic solution 1 drop at bedtime.   levothyroxine (SYNTHROID) 100 MCG tablet Take 1 tablet (100 mcg total) by mouth daily before breakfast.   melatonin 3 MG TABS tablet Take 1 tablet (3 mg total) by mouth at bedtime.      Allergies:   Patient has no known allergies.   Social History   Socioeconomic History   Marital status: Single    Spouse name: Not on file   Number of children: Not on file   Years of education: Not on file   Highest education level: Not on file  Occupational History   Not on file  Tobacco Use   Smoking status: Never Smoker   Smokeless tobacco: Never Used  Substance and Sexual Activity   Alcohol use: No   Drug use: No   Sexual activity: Not on file  Other Topics Concern   Not on file  Social History Narrative   Not on file   Social Determinants of Health   Financial Resource Strain:    Difficulty of Paying Living Expenses:   Food Insecurity:    Worried About [place] in the Last Year:    Arboriculturist in the Last Year:   Transportation Needs:    Film/video editor (Medical):    Lack of Transportation (Non-Medical):   Physical Activity:    Days of Exercise per Week:    Minutes of Exercise per Session:   Stress:    Feeling of Stress :   Social Connections:    Frequency of Communication with Friends and Family:    Frequency of Social Gatherings with Friends and Family:    Attends Religious Services:    Active Member of Clubs or Organizations:    Attends Archivist Meetings:    Marital Status:      Family History: The patient's family history includes Diabetes in her brother and sister; Hypertension in her mother and sister; Stroke in her mother. There is no history of Heart attack or Breast cancer.  ROS:   Please see the history of present illness.    No recent falls no fevers no chest pain no shortness of breath.  She walks quite a bit around the house, nervous energy.   all other systems reviewed and are negative.  EKGs/Labs/Other Studies Reviewed:    The following studies were reviewed today:  ECHO: 2015  - Low normal to mildly reduced LV function; EF 50; grade 1 diastolic dysfunction; mild LAE;  mild MR and AI; mildly elevated pulmonary pressure.   EKG:  EKG is  ordered today.  The ekg ordered today demonstrates sinus bradycardia 53 with T wave inversions noted in the precordial leads no significant change from prior 2019 personally reviewed.  Recent Labs: 05/14/2019: ALT 6 09/11/2019: TSH 8.45 09/21/2019: BUN 16; Creat 0.95; Hemoglobin 12.3; Platelets 221; Potassium 4.1; Sodium 137  Recent Lipid Panel    Component Value Date/Time   CHOL 200 05/14/2019 0000  TRIG 54 05/14/2019 0000   HDL 85 (A) 05/14/2019 0000   CHOLHDL 3 05/01/2014 0813   VLDL 12.2 05/01/2014 0813   LDLCALC 65 05/01/2014 0813    Physical Exam:    VS:  BP 140/60    Pulse (!) 53    Ht 4\' 9"  (1.448 m)    Wt 142 lb 12.8 oz (64.8 kg)    LMP  (LMP Unknown)    SpO2 96%    BMI 30.90 kg/m     Wt Readings from Last 3 Encounters:  10/02/19 142 lb 12.8 oz (64.8 kg)  09/21/19 145 lb (65.8 kg)  09/18/19 144 lb 3.2 oz (65.4 kg)     GEN:  Well nourished, well developed in no acute distress HEENT: Normal NECK: No JVD; No carotid bruits LYMPHATICS: No lymphadenopathy CARDIAC: RRR, 3/6 systolic/diastolicmurmur, no rubs, gallops RESPIRATORY:  Clear to auscultation without rales, wheezing or rhonchi  ABDOMEN: Soft, non-tender, non-distended MUSCULOSKELETAL:  No edema; No deformity  SKIN: Warm and dry NEUROLOGIC: Quiet, her son helped with her story PSYCHIATRIC:  Normal affect   ASSESSMENT:    1. Coronary artery disease due to lipid rich plaque   2. S/P CABG x 3   3. Pure hypercholesterolemia   4. Essential hypertension    PLAN:    In order of problems listed above:  CAD status post CABG 2015 - Stable, secondary prevention. Continue with aspirin 81 a day. No angina.  Essential hypertension - Well-controlled.  Medications reviewed. Followed closely by primary team.   Resolution of ischemic cardiomyopathy post bypass.  Excellent.  Carotid stenosis bilateral -Mild plaque.  Statin. ASA.    Aortic regurgitation -Mild.  Murmur heard on exam.  Should be of no clinical significance.  Dementia    Medication Adjustments/Labs and Tests Ordered: Current medicines are reviewed at length with the patient today.  Concerns regarding medicines are outlined above.  Orders Placed This Encounter  Procedures   EKG 12-Lead   No orders of the defined types were placed in this encounter.   Patient Instructions  Medication Instructions:  The current medical regimen is effective;  continue present plan and medications.  *If you need a refill on your cardiac medications before your next appointment, please call your pharmacy*  Follow-Up: At [facility], you and your health needs are our priority.  As part of our continuing mission to provide you with exceptional heart care, we have created designated Provider Care Teams.  These Care Teams include your primary Cardiologist (physician) and Advanced Practice Providers (APPs -  Physician Assistants and Nurse Practitioners) who all work together to provide you with the care you need, when you need it.  We recommend signing up for the patient portal called "MyChart".  Sign up information is provided on this After Visit Summary.  MyChart is used to connect with patients for Virtual Visits (Telemedicine).  Patients are able to view lab/test results, encounter notes, upcoming appointments, etc.  Non-urgent messages can be sent to your provider as well.   To learn more about what you can do with MyChart, go to [facility].    Your next appointment:   12 month(s)  The format for your next appointment:   In Person  Provider:   [email or web address], MD  Thank you for choosing [facility]!!         Signed, [facility], MD  10/02/2019 9:36 AM    [facility]

## 2019-10-02 ENCOUNTER — Ambulatory Visit (INDEPENDENT_AMBULATORY_CARE_PROVIDER_SITE_OTHER): Payer: Medicare PPO | Admitting: Cardiology

## 2019-10-02 ENCOUNTER — Encounter: Payer: Self-pay | Admitting: Cardiology

## 2019-10-02 ENCOUNTER — Other Ambulatory Visit: Payer: Self-pay

## 2019-10-02 VITALS — BP 140/60 | HR 53 | Ht <= 58 in | Wt 142.8 lb

## 2019-10-02 DIAGNOSIS — Z951 Presence of aortocoronary bypass graft: Secondary | ICD-10-CM

## 2019-10-02 DIAGNOSIS — I2510 Atherosclerotic heart disease of native coronary artery without angina pectoris: Secondary | ICD-10-CM

## 2019-10-02 DIAGNOSIS — I10 Essential (primary) hypertension: Secondary | ICD-10-CM | POA: Diagnosis not present

## 2019-10-02 DIAGNOSIS — E7800 Pure hypercholesterolemia, unspecified: Secondary | ICD-10-CM

## 2019-10-02 DIAGNOSIS — I2583 Coronary atherosclerosis due to lipid rich plaque: Secondary | ICD-10-CM

## 2019-10-02 NOTE — Patient Instructions (Signed)
Medication Instructions:  The current medical regimen is effective;  continue present plan and medications.  *If you need a refill on your cardiac medications before your next appointment, please call your pharmacy*  Follow-Up: At [facility], you and your health needs are our priority.  As part of our continuing mission to provide you with exceptional heart care, we have created designated Provider Care Teams.  These Care Teams include your primary Cardiologist (physician) and Advanced Practice Providers (APPs -  Physician Assistants and Nurse Practitioners) who all work together to provide you with the care you need, when you need it.  We recommend signing up for the patient portal called "MyChart".  Sign up information is provided on this After Visit Summary.  MyChart is used to connect with patients for Virtual Visits (Telemedicine).  Patients are able to view lab/test results, encounter notes, upcoming appointments, etc.  Non-urgent messages can be sent to your provider as well.   To learn more about what you can do with MyChart, go to [email or web address].    Your next appointment:   12 month(s)  The format for your next appointment:   In Person  Provider:   Mark Skains, MD   Thank you for choosing [facility]!!      

## 2019-10-08 ENCOUNTER — Other Ambulatory Visit: Payer: Self-pay | Admitting: *Deleted

## 2019-10-08 MED ORDER — ALPRAZOLAM 0.5 MG PO TABS: 0.5000 mg | ORAL_TABLET | Freq: Three times a day (TID) | ORAL | 0 refills | Status: DC | PRN

## 2019-10-08 NOTE — Telephone Encounter (Signed)
[name], daughter called and stated that patient needs refill Pended Rx and sent to [facility] for approval.

## 2019-10-26 ENCOUNTER — Encounter: Payer: Self-pay | Admitting: Nurse Practitioner

## 2019-10-26 ENCOUNTER — Other Ambulatory Visit: Payer: Self-pay

## 2019-10-26 ENCOUNTER — Telehealth: Payer: Self-pay | Admitting: *Deleted

## 2019-10-26 ENCOUNTER — Ambulatory Visit (INDEPENDENT_AMBULATORY_CARE_PROVIDER_SITE_OTHER): Payer: Medicare PPO | Admitting: Nurse Practitioner

## 2019-10-26 ENCOUNTER — Ambulatory Visit: Payer: Self-pay | Admitting: Nurse Practitioner

## 2019-10-26 VITALS — BP 142/82 | HR 63 | Temp 97.7°F | Ht 59.0 in | Wt 142.4 lb

## 2019-10-26 DIAGNOSIS — F411 Generalized anxiety disorder: Secondary | ICD-10-CM

## 2019-10-26 DIAGNOSIS — F0391 Unspecified dementia with behavioral disturbance: Secondary | ICD-10-CM

## 2019-10-26 DIAGNOSIS — G4700 Insomnia, unspecified: Secondary | ICD-10-CM | POA: Diagnosis not present

## 2019-10-26 MED ORDER — MEMANTINE HCL 28 X 5 MG & 21 X 10 MG PO TABS: ORAL_TABLET | ORAL | 12 refills | Status: DC

## 2019-10-26 MED ORDER — TRAZODONE HCL 50 MG PO TABS: 25.0000 mg | ORAL_TABLET | Freq: Every day | ORAL | 3 refills | Status: DC

## 2019-10-26 NOTE — Progress Notes (Signed)
Careteam: Patient Care Team: [name] [name], [name] Bucks, NP as PCP - General (Family Medicine) Jerline Pain, MD as PCP - Cardiology (Cardiology)  PLACE OF SERVICE:  [place] Directive information Does Patient Have a Medical Advance Directive?: Yes, Type of Advance Directive: [place], Does patient want to make changes to medical advance directive?: No - Patient declined  No Known Allergies  Chief Complaint  Patient presents with  . Acute Visit    Anxiety has increased and she's  not sleeping well      HPI: Patient is a 86 y.o. female here with family who are concerned that she is not sleeping well and very anxious.  "defiant and all over the place" per her daughter.  Taking buspar 15 mg every 4 hours (4 times a day) to get 30 mg twice daily. Reports taking 30 mg BID makes her sedated or will make her more anxious.  If she does not take the buspar she is very anxious and moving around. Daughter also gives her the xanax for anxiety- in the middle of the day does not really have much effect.  Taking eszopiclone 3 mg daily- most the time she will not sleep through the night or does not sleep at all.  Very busy throughout the day- going in the closet, drawers, opening windows and doors.       Review of Systems:  Review of Systems  Unable to perform ROS: Dementia    Past Medical History:  Diagnosis Date  . Carotid stenosis    a. Carotid US (10/15):  Bilateral ICA 1-39%  . Coronary artery disease    a. NSTEMI >> LHC (02/11/14):  pLAD 95%, mLAD occl, CFX < 20%, pRCA 30%, EF 35%, ant and apical AK >> CABG  . Glucose intolerance (impaired glucose tolerance)    a. A1c 6.1 (01/2014)  . H/O non-ST elevation myocardial infarction (NSTEMI)    01/2014  . HLD (hyperlipidemia)   . Hypertension   . Ischemic cardiomyopathy    a. EF 35% by cath at time of NSTEMI >> b. Echo (10/15):  EF 50% to 55%. Wall motion was normal; Grade 1 diastolic dysfunction.  Mild  AI.  Ascending aorta mildly dilated. Mild MR.  Mild LAE.  PA peak pressure: 46 mm Hg (S).   Past Surgical History:  Procedure Laterality Date  . CESAREAN SECTION    . CORONARY ARTERY BYPASS GRAFT N/A 02/15/2014   Procedure: CORONARY ARTERY BYPASS GRAFTING (CABG) x three,  using left internal mammary artery and right leg greater saphenous vein harvested endoscopically;  Surgeon: Gaye Pollack, MD;  Location: [facility] OR;  Service: Open Heart Surgery;  Laterality: N/A;  . LEFT HEART CATHETERIZATION WITH CORONARY ANGIOGRAM N/A 02/11/2014   Procedure: LEFT HEART CATHETERIZATION WITH CORONARY ANGIOGRAM;  Surgeon: Peter M Martinique, MD;  Location: [facility] CATH LAB;  Service: Cardiovascular;  Laterality: N/A;  . TEE WITHOUT CARDIOVERSION N/A 02/15/2014   Procedure: TRANSESOPHAGEAL ECHOCARDIOGRAM (TEE);  Surgeon: Gaye Pollack, MD;  Location: [facility];  Service: Open Heart Surgery;  Laterality: N/A;   Social History:   reports that she has never smoked. She has never used smokeless tobacco. She reports that she does not drink alcohol and does not use drugs.  Family History  Problem Relation Age of Onset  . Stroke Mother   . Hypertension Mother   . Hypertension Sister   . Diabetes Brother   . Diabetes Sister   . Heart attack Neg  Hx   . Breast cancer Neg Hx     Medications: Patient's Medications  New Prescriptions   MEMANTINE (NAMENDA TITRATION PAK) TABLET PACK    5 mg/day for =1 week; 5 mg twice daily for =1 week; 15 mg/day given in 5 mg and 10 mg separated doses for =1 week; then 10 mg twice daily   TRAZODONE (DESYREL) 50 MG TABLET    Take 0.5 tablets (25 mg total) by mouth at bedtime.  Previous Medications   ACETAMINOPHEN (TYLENOL) 500 MG TABLET    Take 1,000 mg by mouth every 6 (six) hours as needed (pain).   ALPRAZOLAM (XANAX) 0.5 MG TABLET    Take 1 tablet (0.5 mg total) by mouth 3 (three) times daily as needed for anxiety.   AMLODIPINE (NORVASC) 5 MG TABLET    Take 5 mg by mouth daily.   ASPIRIN 81  MG CHEWABLE TABLET    Chew 81 mg by mouth daily.   B-COMPLEX-C CAPS    Take 1 capsule by mouth daily.   BUSPIRONE (BUSPAR) 30 MG TABLET    Take 15-30 mg by mouth 2 (two) times daily.    CALCIUM-MAGNESIUM-VITAMIN D 600-40-500 MG-MG-UNIT TB24    Take 1 tablet by mouth daily.   CHOLECALCIFEROL (VITAMIN D3) 2000 UNITS TABS    Take 2,000 Units by mouth daily.   EZETIMIBE (ZETIA) 10 MG TABLET    Take 10 mg by mouth daily.   LATANOPROST (XALATAN) 0.005 % OPHTHALMIC SOLUTION    1 drop at bedtime.   LEVOTHYROXINE (SYNTHROID) 100 MCG TABLET    Take 1 tablet (100 mcg total) by mouth daily before breakfast.  Modified Medications   No medications on file  Discontinued Medications   ESZOPICLONE 3 MG TABS    Take 1 tablet (3 mg total) by mouth at bedtime as needed.    Physical Exam:  Vitals:   10/26/19 1002  BP: (!) 142/82  Pulse: 63  Temp: 97.7 F (36.5 C)  TempSrc: Temporal  SpO2: 99%  Weight: 142 lb 6.4 oz (64.6 kg)  Height: 4\' 11"  (1.499 m)   Body mass index is 28.76 kg/m. Wt Readings from Last 3 Encounters:  10/26/19 142 lb 6.4 oz (64.6 kg)  10/02/19 142 lb 12.8 oz (64.8 kg)  09/21/19 145 lb (65.8 kg)    Physical Exam Constitutional:      General: She is not in acute distress.    Appearance: She is well-developed. She is not diaphoretic.  HENT:     Head: Normocephalic and atraumatic.  Eyes:     Conjunctiva/sclera: Conjunctivae normal.     Pupils: Pupils are equal, round, and reactive to light.  Cardiovascular:     Rate and Rhythm: Normal rate and regular rhythm.     Heart sounds: Normal heart sounds.  Pulmonary:     Effort: Pulmonary effort is normal.     Breath sounds: Normal breath sounds.  Musculoskeletal:        General: No tenderness.     Cervical back: Normal range of motion and neck supple.  Skin:    General: Skin is warm and dry.  Neurological:     Mental Status: She is alert.  Psychiatric:        Mood and Affect: Mood normal.        Cognition and Memory:  Cognition is impaired. Memory is impaired. She exhibits impaired recent memory.    Labs reviewed: Basic Metabolic Panel: Recent Labs    05/14/19 0000 07/16/19 1052 09/11/19  1008 09/21/19 1519  NA 142  --   --  137  K 4.3  --   --  4.1  CL 103  --   --  103  CO2 25*  --   --  28  GLUCOSE  --   --   --  90  BUN 11  --   --  16  CREATININE 1.1  --   --  0.95*  CALCIUM 10.0  --   --  10.4  TSH  --  36.76* 8.45*  --    Liver Function Tests: Recent Labs    05/14/19 0000  AST 15  ALT 6*  ALKPHOS 61  ALBUMIN 4.1   No results for input(s): LIPASE, AMYLASE in the last 8760 hours. No results for input(s): AMMONIA in the last 8760 hours. CBC: Recent Labs    09/21/19 1519  WBC 6.4  NEUTROABS 3,661  HGB 12.3  HCT 38.3  MCV 89.3  PLT 221   Lipid Panel: Recent Labs    05/14/19 0000  CHOL 200  HDL 85*  TRIG 54   TSH: Recent Labs    07/16/19 1052 09/11/19 1008  TSH 36.76* 8.45*   A1C: Lab Results  Component Value Date   HGBA1C 6.1 (H) 02/11/2014     Assessment/Plan 1. Dementia with behavioral disturbance, unspecified dementia type (HCC) Progressive decline and increase in agitation. Educated provided to family on ways to keep her calm and busy in a positive way.  Will add namenda to see if this will help with behaviors as well.  informational given for day programs and caregiver support.  - memantine (NAMENDA TITRATION PAK) tablet pack; 5 mg/day for =1 week; 5 mg twice daily for =1 week; 15 mg/day given in 5 mg and 10 mg separated doses for =1 week; then 10 mg twice daily  Dispense: 49 tablet; Refill: 12 -if able to tolerate namenda will call back for refill and then will start namenda 10 mg by mouth twice daily   2. Generalized anxiety disorder Ongoing, continues on buspar 30 mg BID and xanax PRN. Discussed non pharmacological interventions to help with anxiety as well.  - traZODone (DESYREL) 50 MG tablet; Take 0.5 tablets (25 mg total) by mouth at bedtime.   Dispense: 30 tablet; Refill: 3  3. Insomnia, unspecified type To stop eszopiclone at this time (not using routinely and sometimes not even effective when they do use)  -will add trazodone 25 mg to help with sleep and mood. To start first then to add namenda titration after being on trazodone for a week.  - traZODone (DESYREL) 50 MG tablet; Take 0.5 tablets (25 mg total) by mouth at bedtime.  Dispense: 30 tablet; Refill: 3  Next appt: 03/11/2020 as scheduled, sooner if needed Nachelle Negrette K. Biagio Borg  [facility] [phone]

## 2019-10-26 NOTE — Patient Instructions (Addendum)
Start trazodone 25 mg by mouth at bedtime for mood and sleep   STOP ESZOPICLONE  Look into a day program.  Keep an "activity book" ideas that you can give her if she becomes fidgety/anxious.  Distraction is good.  Coloring books.  Teepa snow- youtube.    After she has been on trazodone for 1 week start namenda titration

## 2019-10-26 NOTE — Telephone Encounter (Signed)
Received FMLA Paperwork from patient's son Isabellamarie Randa #[phone] for employer [employer] [place].  Collene Mares Benefits Administrator [phone]  Dinah brought the paperwork to my desk with a note stating that patient needed an appointment to have paperwork filled out. (I was on the phone when she left it)  I looked up patient and she had an appointment for today 10/26/2019 with [name]. I took the paperwork to [place] and she stated that the PCP was to fill out and charge.  Left paperwork in Dinah's folder to review, fill out and sign.

## 2019-10-31 ENCOUNTER — Telehealth: Payer: Self-pay

## 2019-10-31 NOTE — Telephone Encounter (Signed)
Patients son [name] [name] called to see if the forms that he brought in last week at the time of his mother's visit with [name] [name] had been filled out so he could come by to pick them up today as they where time sensitive and needed to be filled out before 11/01/2019 I found the forms and they had not been filled out I spoke with [name] [name] at [facility] and I then faxed the forms to her to be filled out and [name] [name] said he would return them to the office when he came over at 12:00 o'clock today (10/31/2019)  [name] [name] aware of situation

## 2019-11-04 ENCOUNTER — Other Ambulatory Visit: Payer: Self-pay | Admitting: Family

## 2019-11-15 ENCOUNTER — Other Ambulatory Visit: Payer: Self-pay | Admitting: Nurse Practitioner

## 2019-11-15 DIAGNOSIS — F0391 Unspecified dementia with behavioral disturbance: Secondary | ICD-10-CM

## 2019-11-19 ENCOUNTER — Telehealth: Payer: Self-pay | Admitting: *Deleted

## 2019-11-19 MED ORDER — MEMANTINE HCL 10 MG PO TABS: 10.0000 mg | ORAL_TABLET | Freq: Two times a day (BID) | ORAL | 1 refills | Status: DC

## 2019-11-19 NOTE — Telephone Encounter (Signed)
She can try taking 75 mg tablet at bedtime.

## 2019-11-19 NOTE — Telephone Encounter (Signed)
Patient son, Asher Muir notified and agreed.  Appointment scheduled for Friday with Dinah.

## 2019-11-19 NOTE — Telephone Encounter (Signed)
Patient is already doing the Trazodone 50mg  at bedtime. Wants to know if this can be increased.  Please Advise.

## 2019-11-19 NOTE — Telephone Encounter (Signed)
Okay to refill Namenda per Jessica's orders.May also increase Trazodone from 25 mg tablet to 50 mg tablet at bedtime for sleep.  Pease schedule in office visit to discuss referral for home assistance and Dementia concerns.

## 2019-11-19 NOTE — Telephone Encounter (Signed)
Patient son, Asher Muir called and stated that he would like to speak with provider directly concerning his concern. Refused Televisit/ Appointment.   1. Stated that patient had an appointment on 10/26/19 with [name] and given Namenda. Stated that patient is on the 3rd week and wanted to know what to do afterwards.  Per Jessica's OV note: Namenda 10mg  twice daily (Pended Rx and sent to [facility] for approval)  2. Caregiver is requesting a [facility] Referral for assistance to help with POA and finding a Daycare and help for needs in the home.   3. The Trazodone prescribed half tablet did not work. They gave whole tablet and it worked better but it does not last long. Need to find a balance with sleep. Can this medication be increased.   4. How often does her dementia need to be checked for progression. Does she need a CT Scan yearly.   Please Advise.

## 2019-11-22 ENCOUNTER — Other Ambulatory Visit: Payer: Self-pay | Admitting: Family

## 2019-11-22 DIAGNOSIS — E039 Hypothyroidism, unspecified: Secondary | ICD-10-CM

## 2019-11-23 ENCOUNTER — Other Ambulatory Visit: Payer: Self-pay

## 2019-11-23 ENCOUNTER — Ambulatory Visit (INDEPENDENT_AMBULATORY_CARE_PROVIDER_SITE_OTHER): Payer: Medicare PPO | Admitting: Family

## 2019-11-23 ENCOUNTER — Encounter: Payer: Self-pay | Admitting: Family

## 2019-11-23 VITALS — BP 120/82 | HR 60 | Temp 97.1°F | Resp 16 | Ht 59.0 in | Wt 143.8 lb

## 2019-11-23 DIAGNOSIS — G4700 Insomnia, unspecified: Secondary | ICD-10-CM

## 2019-11-23 DIAGNOSIS — F0391 Unspecified dementia with behavioral disturbance: Secondary | ICD-10-CM

## 2019-11-23 MED ORDER — TRAZODONE HCL 100 MG PO TABS: 100.0000 mg | ORAL_TABLET | Freq: Every day | ORAL | 1 refills | Status: DC

## 2019-11-23 NOTE — Progress Notes (Signed)
Provider: Richarda Blade FNP-C  Rochele Lueck, [name], NP  Patient Care Team: Alyn Riedinger, [name], NP as PCP - General (Family Medicine) [name], MD as PCP - Cardiology (Cardiology)  Extended Emergency Contact Information Primary Emergency Contact: Tadros,Robin Address: [address]          [place] [address] [place] Home Phone: [phone] Relation: Daughter Secondary Emergency Contact: Cassara, Nida Mobile Phone: [phone] Relation: Son  Code Status:  Full Code  Goals of care: Advanced Directive information Advanced Directives 11/23/2019  Does Patient Have a Medical Advance Directive? Yes  Type of Advance Directive Healthcare Power of Attorney  Does patient want to make changes to medical advance directive? No - Patient declined  Copy of Healthcare Power of Attorney in Chart? Yes - validated most recent copy scanned in chart (See row information)  Would patient like information on creating a medical advance directive? -     Chief Complaint  Patient presents with  . Acute Visit    Discuss in home care for Dementia.    HPI:  Pt is a 86 y.o. female seen today for an acute visit for evaluation of dementia for home health.she is here with daughter and son.Son states needs more assistance taking care of patient.He would like Home Health Nurse assistant and social worker ordered to evaluate for service including Adult day care program.No fever,chills,cough or symptoms of urinary tract infection.Face to face assesement completed for Home health referral.Daughter states patient's behaviors slightly improved on Namenda.Also sleeping better on trazodone request dose to be increased has tried giving 100 mg tablet which has worked better.    Past Medical History:  Diagnosis Date  . Carotid stenosis    a. Carotid US (10/15):  Bilateral ICA 1-39%  . Coronary artery disease    a. NSTEMI >> LHC (02/11/14):  pLAD 95%, mLAD occl, CFX < 20%, pRCA 30%, EF 35%, ant and apical  AK >> CABG  . Glucose intolerance (impaired glucose tolerance)    a. A1c 6.1 (01/2014)  . H/O non-ST elevation myocardial infarction (NSTEMI)    01/2014  . HLD (hyperlipidemia)   . Hypertension   . Ischemic cardiomyopathy    a. EF 35% by cath at time of NSTEMI >> b. Echo (10/15):  EF 50% to 55%. Wall motion was normal; Grade 1 diastolic dysfunction.  Mild AI.  Ascending aorta mildly dilated. Mild MR.  Mild LAE.  PA peak pressure: 46 mm Hg (S).   Past Surgical History:  Procedure Laterality Date  . CESAREAN SECTION    . CORONARY ARTERY BYPASS GRAFT N/A 02/15/2014   Procedure: CORONARY ARTERY BYPASS GRAFTING (CABG) x three,  using left internal mammary artery and right leg greater saphenous vein harvested endoscopically;  Surgeon: Alleen Borne, MD;  Location: [facility] OR;  Service: Open Heart Surgery;  Laterality: N/A;  . LEFT HEART CATHETERIZATION WITH CORONARY ANGIOGRAM N/A 02/11/2014   Procedure: LEFT HEART CATHETERIZATION WITH CORONARY ANGIOGRAM;  Surgeon: Peter M Swaziland, MD;  Location: [facility] CATH LAB;  Service: Cardiovascular;  Laterality: N/A;  . TEE WITHOUT CARDIOVERSION N/A 02/15/2014   Procedure: TRANSESOPHAGEAL ECHOCARDIOGRAM (TEE);  Surgeon: Alleen Borne, MD;  Location: [facility] OR;  Service: Open Heart Surgery;  Laterality: N/A;    No Known Allergies  Outpatient Encounter Medications as of 11/23/2019  Medication Sig  . acetaminophen (TYLENOL) 500 MG tablet Take 1,000 mg by mouth every 6 (six) hours as needed (pain).  Marland Kitchen ALPRAZolam (XANAX) 0.5 MG tablet TAKE 1 TABLET (  0.5 MG TOTAL) BY MOUTH 3 (THREE) TIMES DAILY AS NEEDED FOR ANXIETY.  Marland Kitchen amLODipine (NORVASC) 5 MG tablet Take 5 mg by mouth daily.  Marland Kitchen aspirin 81 MG chewable tablet Chew 81 mg by mouth daily.  Marland Kitchen B-Complex-C CAPS Take 1 capsule by mouth daily.  . busPIRone (BUSPAR) 30 MG tablet Take 15-30 mg by mouth 2 (two) times daily.   . Calcium-Magnesium-Vitamin D 600-40-500 MG-MG-UNIT TB24 Take 1 tablet by mouth daily.  . Cholecalciferol  (VITAMIN D3) 2000 UNITS TABS Take 2,000 Units by mouth daily.  Marland Kitchen ezetimibe (ZETIA) 10 MG tablet Take 10 mg by mouth daily.  Marland Kitchen latanoprost (XALATAN) 0.005 % ophthalmic solution 1 drop at bedtime.  Marland Kitchen levothyroxine (SYNTHROID) 100 MCG tablet TAKE 1 TABLET BY MOUTH DAILY BEFORE BREAKFAST.  . memantine (NAMENDA TITRATION PAK) tablet pack 5 mg/day for =1 week; 5 mg twice daily for =1 week; 15 mg/day given in 5 mg and 10 mg separated doses for =1 week; then 10 mg twice daily  . memantine (NAMENDA) 10 MG tablet Take 1 tablet (10 mg total) by mouth 2 (two) times daily.  . traZODone (DESYREL) 50 MG tablet Take 75 mg by mouth at bedtime. 1 & 1/2 tablet.  . [DISCONTINUED] traZODone (DESYREL) 50 MG tablet Take 0.5 tablets (25 mg total) by mouth at bedtime. (Patient taking differently: Take 75 mg by mouth at bedtime. )   No facility-administered encounter medications on file as of 11/23/2019.    Review of Systems  Constitutional: Negative for appetite change, chills, fatigue and fever.  HENT: Negative for congestion, rhinorrhea, sinus pressure, sinus pain, sneezing and sore throat.   Eyes: Negative for discharge, redness and itching.  Respiratory: Negative for cough, chest tightness, shortness of breath and wheezing.   Cardiovascular: Negative for chest pain, palpitations and leg swelling.  Gastrointestinal: Negative for abdominal distention, abdominal pain, constipation, diarrhea, nausea and vomiting.  Endocrine: Negative for cold intolerance, heat intolerance, polydipsia, polyphagia and polyuria.  Genitourinary: Negative for difficulty urinating, dysuria, flank pain, frequency and urgency.  Musculoskeletal: Negative for arthralgias, back pain, joint swelling and myalgias.  Skin: Negative for color change, pallor and rash.  Neurological: Negative for dizziness, speech difficulty, weakness, light-headedness, numbness and headaches.  Hematological: Does not bruise/bleed easily.  Psychiatric/Behavioral:  Positive for confusion. Negative for agitation and sleep disturbance. The patient is not nervous/anxious.     Immunization History  Administered Date(s) Administered  . Fluad Quad(high Dose 65+) 05/21/2019  . PFIZER SARS-COV-2 Vaccination 06/10/2019, 07/05/2019  . Pneumococcal Conjugate-13 01/31/2012  . Pneumococcal Polysaccharide-23 05/21/2019   Pertinent  Health Maintenance Due  Topic Date Due  . INFLUENZA VACCINE  12/02/2019  . DEXA SCAN  Completed  . PNA vac Low Risk Adult  Completed   Fall Risk  11/23/2019 10/26/2019 09/18/2019 05/21/2019  Falls in the past year? 0 0 0 1  Number falls in past yr: 0 0 0 0  Injury with Fall? 0 0 0 1       Vitals:   11/23/19 1342  BP: 120/82  Pulse: 60  Resp: 16  Temp: (!) 97.1 F (36.2 C)  SpO2: 97%  Weight: 143 lb 12.8 oz (65.2 kg)  Height: 4\' 11"  (1.499 m)   Body mass index is 29.04 kg/m. Physical Exam Vitals reviewed.  Constitutional:      General: She is not in acute distress.    Appearance: She is overweight. She is not ill-appearing.  HENT:     Head: Normocephalic.  Right Ear: Tympanic membrane, ear canal and external ear normal. There is no impacted cerumen.     Left Ear: Tympanic membrane, ear canal and external ear normal. There is no impacted cerumen.     Nose: Nose normal. No congestion or rhinorrhea.     Mouth/Throat:     Mouth: Mucous membranes are moist.     Pharynx: Oropharynx is clear. No oropharyngeal exudate or posterior oropharyngeal erythema.  Eyes:     General: No scleral icterus.       Right eye: No discharge.        Left eye: No discharge.     Extraocular Movements: Extraocular movements intact.     Conjunctiva/sclera: Conjunctivae normal.     Pupils: Pupils are equal, round, and reactive to light.  Neck:     Vascular: No carotid bruit.  Cardiovascular:     Rate and Rhythm: Normal rate and regular rhythm.     Pulses: Normal pulses.     Heart sounds: Normal heart sounds. No murmur heard.  No  friction rub. No gallop.   Pulmonary:     Effort: Pulmonary effort is normal. No respiratory distress.     Breath sounds: Normal breath sounds. No wheezing, rhonchi or rales.  Chest:     Chest wall: No tenderness.  Abdominal:     General: Bowel sounds are normal. There is no distension.     Palpations: Abdomen is soft. There is no mass.     Tenderness: There is no abdominal tenderness. There is no right CVA tenderness, left CVA tenderness, guarding or rebound.  Musculoskeletal:     Cervical back: Normal range of motion. No rigidity or tenderness.  Lymphadenopathy:     Cervical: No cervical adenopathy.  Neurological:     Mental Status: She is alert.  Psychiatric:        Mood and Affect: Mood normal.        Speech: Speech normal.        Behavior: Behavior is cooperative.        Cognition and Memory: Memory is impaired.    Labs reviewed: Recent Labs    05/14/19 0000 09/21/19 1519  NA 142 137  K 4.3 4.1  CL 103 103  CO2 25* 28  GLUCOSE  --  90  BUN 11 16  CREATININE 1.1 0.95*  CALCIUM 10.0 10.4   Recent Labs    05/14/19 0000  AST 15  ALT 6*  ALKPHOS 61  ALBUMIN 4.1   Recent Labs    09/21/19 1519  WBC 6.4  NEUTROABS 3,661  HGB 12.3  HCT 38.3  MCV 89.3  PLT 221   Lab Results  Component Value Date   TSH 8.45 (H) 09/11/2019   Lab Results  Component Value Date   HGBA1C 6.1 (H) 02/11/2014   Lab Results  Component Value Date   CHOL 200 05/14/2019   HDL 85 (A) 05/14/2019   LDLCALC 65 05/01/2014   TRIG 54 05/14/2019   CHOLHDL 3 05/01/2014    Significant Diagnostic Results in last 30 days:  No results found.  Assessment/Plan 1. Dementia with behavioral disturbance, unspecified dementia type (HCC) Progressive decline expect - continue on Mematine 10 mg tablet twice daily requires refill today.  - Ambulatory referral to Home Health; for Home Nurse aide to assist with ADL's and Social worker for with Day care program and possible placement to higher level  of care.  2. Insomnia, unspecified type Trazodone 25 mg tablet previous ordered but not  effective family had tried two tablets which has worked better.increase Trazodone to 100 mg tablet daily at bedtime.   Family/ staff Communication: Reviewed plan of care with patient,Daughter and son   Labs/tests ordered: None   Next Appointment: As needed if symptoms worsen or fail to improve.   Caesar Bookmaninah C Matix Henshaw, NP

## 2019-11-23 NOTE — Patient Instructions (Signed)
Home health referral placed for social worker to evaluate.Home health agency will call for appointment.

## 2019-11-26 ENCOUNTER — Telehealth: Payer: Self-pay | Admitting: *Deleted

## 2019-11-26 DIAGNOSIS — G4700 Insomnia, unspecified: Secondary | ICD-10-CM

## 2019-11-26 MED ORDER — TRAZODONE HCL 100 MG PO TABS: 100.0000 mg | ORAL_TABLET | Freq: Every day | ORAL | 0 refills | Status: DC

## 2019-11-26 NOTE — Telephone Encounter (Signed)
Trazodone 100 mg tablet take one by mouth at bedtime.

## 2019-11-26 NOTE — Telephone Encounter (Signed)
[facility] [address] sent a fax regarding Trazodone Directions.   States to Take One tablet by mouth at bedtime. 1 & 1/2 tablet.  Please Clarify.

## 2019-11-26 NOTE — Telephone Encounter (Signed)
Dinah sent patient's updated rx to pharmacy.

## 2019-11-30 ENCOUNTER — Telehealth: Payer: Self-pay

## 2019-11-30 NOTE — Telephone Encounter (Signed)
Was in

## 2019-11-30 NOTE — Telephone Encounter (Signed)
[name] [name] said he was in the office with his mom asked for a referral for a Child psychotherapist and has not heard anything from the company or from [name]. [name] reached out to patient and told him that referral had initially been sent to [name], but they did not have a contract with [name]. Referral was sent to [name] on 11/29/19. Told son that they should contact them directly to schedule services.  [name] was also provided for him to contact agency.   Lastly he wold like a phone call to talk to [name] because he stated she seemed to know more about his moms situation. [name] said patient would have to make an appointment.  I called [name] [name] (son(  Back and  he said that he will not be making an appointment for this because the last time it was useless for him to make an appointment for a Child psychotherapist.    No need to respond this is just documentation

## 2019-12-06 ENCOUNTER — Other Ambulatory Visit: Payer: Self-pay | Admitting: Family

## 2019-12-06 NOTE — Telephone Encounter (Signed)
RX last filled in epic on 11/08/2019  No treatment agreement on file, last OV was 11/23/2019.  Pending appointment on 03/18/2020, notation made to sign non-opioid treatment agreement

## 2019-12-11 ENCOUNTER — Other Ambulatory Visit: Payer: Self-pay | Admitting: Family

## 2019-12-11 NOTE — Telephone Encounter (Signed)
Pharmacy requested refill. Stated patient is requesting a 90 day supply instead of 30.   Rx Pended and sent to [facility] for approval due to HIGH ALERT Warning.

## 2019-12-18 ENCOUNTER — Telehealth: Payer: Self-pay

## 2019-12-18 NOTE — Telephone Encounter (Signed)
Darlene with [facility] states they received a referral on patient yet its not the full referral that explains what services the patient needs to be evaluated for and they also need a face to face OV note.  OV note dated 11/23/2019 and full referral order faxed to [phone]

## 2019-12-27 ENCOUNTER — Ambulatory Visit: Payer: Medicare PPO

## 2020-01-01 ENCOUNTER — Other Ambulatory Visit (INDEPENDENT_AMBULATORY_CARE_PROVIDER_SITE_OTHER): Payer: Medicare PPO

## 2020-01-01 ENCOUNTER — Other Ambulatory Visit: Payer: Self-pay

## 2020-01-01 DIAGNOSIS — Z111 Encounter for screening for respiratory tuberculosis: Secondary | ICD-10-CM

## 2020-01-03 LAB — QUANTIFERON-TB GOLD PLUS
Mitogen-NIL: >10 IU/mL
NIL: 0.03 IU/mL
QuantiFERON-TB Gold Plus: NEGATIVE
TB1-NIL: 0 IU/mL
TB2-NIL: 0.01 IU/mL

## 2020-01-04 ENCOUNTER — Other Ambulatory Visit: Payer: Self-pay | Admitting: Family

## 2020-01-04 NOTE — Telephone Encounter (Signed)
Rx last filled on 12/06/2019 in Epic   Patient to sign treatment agreement at pending appointment in November 2021

## 2020-02-05 ENCOUNTER — Other Ambulatory Visit: Payer: Self-pay | Admitting: Family

## 2020-02-06 NOTE — Telephone Encounter (Signed)
It appears she has switched to me to be her PCP, she has an appt in November but if I am going to be refilling her medication will need to see her sooner, will give her 30 day supply but needs to make appt within the next 30 days for further refills.

## 2020-02-06 NOTE — Telephone Encounter (Signed)
Sent to [place] for approval

## 2020-02-07 NOTE — Telephone Encounter (Signed)
Appointment moved to 02/15/2020 @ 11 am

## 2020-02-15 ENCOUNTER — Encounter: Payer: Self-pay | Admitting: Nurse Practitioner

## 2020-02-15 ENCOUNTER — Ambulatory Visit (INDEPENDENT_AMBULATORY_CARE_PROVIDER_SITE_OTHER): Payer: Medicare PPO | Admitting: Nurse Practitioner

## 2020-02-15 ENCOUNTER — Other Ambulatory Visit: Payer: Self-pay

## 2020-02-15 VITALS — BP 142/70 | HR 53 | Temp 96.8°F | Ht 59.0 in | Wt 139.0 lb

## 2020-02-15 DIAGNOSIS — I10 Essential (primary) hypertension: Secondary | ICD-10-CM

## 2020-02-15 DIAGNOSIS — I2510 Atherosclerotic heart disease of native coronary artery without angina pectoris: Secondary | ICD-10-CM

## 2020-02-15 DIAGNOSIS — G4700 Insomnia, unspecified: Secondary | ICD-10-CM

## 2020-02-15 DIAGNOSIS — F0391 Unspecified dementia with behavioral disturbance: Secondary | ICD-10-CM | POA: Diagnosis not present

## 2020-02-15 DIAGNOSIS — Z23 Encounter for immunization: Secondary | ICD-10-CM

## 2020-02-15 DIAGNOSIS — K5904 Chronic idiopathic constipation: Secondary | ICD-10-CM

## 2020-02-15 DIAGNOSIS — I2583 Coronary atherosclerosis due to lipid rich plaque: Secondary | ICD-10-CM

## 2020-02-15 DIAGNOSIS — E782 Mixed hyperlipidemia: Secondary | ICD-10-CM

## 2020-02-15 DIAGNOSIS — F411 Generalized anxiety disorder: Secondary | ICD-10-CM

## 2020-02-15 DIAGNOSIS — E039 Hypothyroidism, unspecified: Secondary | ICD-10-CM

## 2020-02-15 MED ORDER — ALPRAZOLAM 0.25 MG PO TABS: 0.2500 mg | ORAL_TABLET | Freq: Two times a day (BID) | ORAL | Status: DC | PRN

## 2020-02-15 NOTE — Patient Instructions (Addendum)
To get COVID booster    TDAP to get at local pharmacy   Reduce xanax to half tablet twice daily over the next 2 weeks- if she is doing okay on this dose then decrease to daily for 2 weeks then to use only as needed- please notify us if there are any questions or concerns with titration.   If needed at night to start using trazodone 50 mg at bedtime.   Continue to keep her active, if there are any activities she likes to do encourage it.    Constipation, Adult Constipation is when a person has fewer bowel movements in a week than normal, has difficulty having a bowel movement, or has stools that are dry, hard, or larger than normal. Constipation may be caused by an underlying condition. It may become worse with age if a person takes certain medicines and does not take in enough fluids. Follow these instructions at home: Eating and drinking   Eat foods that have a lot of fiber, such as fresh fruits and vegetables, whole grains, and beans.  Limit foods that are high in fat, low in fiber, or overly processed, such as french fries, hamburgers, cookies, candies, and soda.  Drink enough fluid to keep your urine clear or pale yellow. General instructions  Exercise regularly or as told by your health care provider.  Go to the restroom when you have the urge to go. Do not hold it in.  Take over-the-counter and prescription medicines only as told by your health care provider. These include any fiber supplements.  Practice pelvic floor retraining exercises, such as deep breathing while relaxing the lower abdomen and pelvic floor relaxation during bowel movements.  Watch your condition for any changes.  Keep all follow-up visits as told by your health care provider. This is important. Contact a health care provider if:  You have pain that gets worse.  You have a fever.  You do not have a bowel movement after 4 days.  You vomit.  You are not hungry.  You lose weight.  You are  bleeding from the anus.  You have thin, pencil-like stools. Get help right away if:  You have a fever and your symptoms suddenly get worse.  You leak stool or have blood in your stool.  Your abdomen is bloated.  You have severe pain in your abdomen.  You feel dizzy or you faint. This information is not intended to replace advice given to you by your health care provider. Make sure you discuss any questions you have with your health care provider. Document Revised: 04/01/2017 Document Reviewed: 10/08/2015 Elsevier Patient Education  2020 [employer].

## 2020-02-15 NOTE — Progress Notes (Signed)
Careteam: Patient Care Team: Lauree Chandler, NP as PCP - General (Geriatric Medicine) Jerline Pain, MD as PCP - Cardiology (Cardiology)  PLACE OF SERVICE:  [facility] Directive information    No Known Allergies  Chief Complaint  Patient presents with  . Medical Management of Chronic Issues    6 month follow-up. Patient with constipation and questions if related to medications. Flu vaccine today. Discuss need for TDap/TD. Patients son would like to discuss opions with treatment of current mental state. Here with son Roselyn Reef.      HPI: Patient is a 86 y.o. female for follow up.   Anxiety- comes and goes, not as severe has it has been in the past. Still there.  She is taking buspar 15 mg twice in the morning and 15 mg once in the evening (sometimes twice) Xanax using mostly twice daily- mid day and at bedtime.   Insomnia- using xanax vs trazodone.   Dementia- walks around the house nevous energy per daughter.   Constipation- using benefiber,drinks plenty of water.     Review of Systems:  Review of Systems  Constitutional: Negative for chills, fever and weight loss.  HENT: Negative for tinnitus.   Respiratory: Negative for cough, sputum production and shortness of breath.   Cardiovascular: Negative for chest pain, palpitations and leg swelling.  Gastrointestinal: Positive for constipation. Negative for abdominal pain, diarrhea and heartburn.  Genitourinary: Negative for dysuria, frequency and urgency.  Musculoskeletal: Positive for back pain. Negative for falls, joint pain and myalgias.  Skin: Negative.   Neurological: Negative for dizziness and headaches.  Psychiatric/Behavioral: Positive for memory loss. Negative for depression. The patient is nervous/anxious and has insomnia.     Past Medical History:  Diagnosis Date  . Carotid stenosis    a. Carotid US (10/15):  Bilateral ICA 1-39%  . Coronary artery disease    a. NSTEMI >> LHC (02/11/14):  pLAD  95%, mLAD occl, CFX < 20%, pRCA 30%, EF 35%, ant and apical AK >> CABG  . Glucose intolerance (impaired glucose tolerance)    a. A1c 6.1 (01/2014)  . H/O non-ST elevation myocardial infarction (NSTEMI)    01/2014  . HLD (hyperlipidemia)   . Hypertension   . Ischemic cardiomyopathy    a. EF 35% by cath at time of NSTEMI >> b. Echo (10/15):  EF 50% to 55%. Wall motion was normal; Grade 1 diastolic dysfunction.  Mild AI.  Ascending aorta mildly dilated. Mild MR.  Mild LAE.  PA peak pressure: 46 mm Hg (S).   Past Surgical History:  Procedure Laterality Date  . CESAREAN SECTION    . CORONARY ARTERY BYPASS GRAFT N/A 02/15/2014   Procedure: CORONARY ARTERY BYPASS GRAFTING (CABG) x three,  using left internal mammary artery and right leg greater saphenous vein harvested endoscopically;  Surgeon: Gaye Pollack, MD;  Location: [facility];  Service: Open Heart Surgery;  Laterality: N/A;  . LEFT HEART CATHETERIZATION WITH CORONARY ANGIOGRAM N/A 02/11/2014   Procedure: LEFT HEART CATHETERIZATION WITH CORONARY ANGIOGRAM;  Surgeon: Peter M Martinique, MD;  Location: [facility] CATH LAB;  Service: Cardiovascular;  Laterality: N/A;  . TEE WITHOUT CARDIOVERSION N/A 02/15/2014   Procedure: TRANSESOPHAGEAL ECHOCARDIOGRAM (TEE);  Surgeon: Gaye Pollack, MD;  Location: [facility];  Service: Open Heart Surgery;  Laterality: N/A;   Social History:   reports that she has never smoked. She has never used smokeless tobacco. She reports that she does not drink alcohol and does not use  drugs.  Family History  Problem Relation Age of Onset  . Stroke Mother   . Hypertension Mother   . Hypertension Sister   . Diabetes Brother   . Diabetes Sister   . Heart attack Neg Hx   . Breast cancer Neg Hx     Medications: Patient's Medications  New Prescriptions   No medications on file  Previous Medications   ACETAMINOPHEN (TYLENOL) 500 MG TABLET    Take 1,000 mg by mouth every 6 (six) hours as needed (pain).   ALPRAZOLAM (XANAX) 0.5 MG  TABLET    TAKE 1 TABLET BY MOUTH THREE TIMES A DAY AS NEEDED FOR ANXIETY   AMLODIPINE (NORVASC) 5 MG TABLET    Take 5 mg by mouth daily.   ASPIRIN 81 MG CHEWABLE TABLET    Chew 81 mg by mouth daily.   B-COMPLEX-C CAPS    Take 1 capsule by mouth daily.   BUSPIRONE (BUSPAR) 30 MG TABLET    Take 15 mg by mouth 2 (two) times daily.    CALCIUM-MAGNESIUM-VITAMIN D 299-24-268 MG-MG-UNIT TB24    Take 1 tablet by mouth daily.   CHOLECALCIFEROL (VITAMIN D3) 2000 UNITS TABS    Take 2,000 Units by mouth daily.   EZETIMIBE (ZETIA) 10 MG TABLET    Take 10 mg by mouth daily.   LATANOPROST (XALATAN) 0.005 % OPHTHALMIC SOLUTION    1 drop at bedtime.   LEVOTHYROXINE (SYNTHROID) 100 MCG TABLET    TAKE 1 TABLET BY MOUTH DAILY BEFORE BREAKFAST.   MEMANTINE (NAMENDA) 10 MG TABLET    TAKE 1 TABLET BY MOUTH TWICE A DAY   TRAZODONE (DESYREL) 100 MG TABLET    Take 1 tablet (100 mg total) by mouth at bedtime.  Modified Medications   No medications on file  Discontinued Medications   MEMANTINE (NAMENDA TITRATION PAK) TABLET PACK    5 mg/day for =1 week; 5 mg twice daily for =1 week; 15 mg/day given in 5 mg and 10 mg separated doses for =1 week; then 10 mg twice daily    Physical Exam:  Vitals:   02/15/20 1108  BP: (!) 142/70  Pulse: (!) 53  Temp: (!) 96.8 F (36 C)  TempSrc: Temporal  SpO2: 98%  Weight: 139 lb (63 kg)  Height: $Remove'4\' 11"'cMiKguO$  (1.499 m)   Body mass index is 28.07 kg/m. Wt Readings from Last 3 Encounters:  02/15/20 139 lb (63 kg)  11/23/19 143 lb 12.8 oz (65.2 kg)  10/26/19 142 lb 6.4 oz (64.6 kg)    Physical Exam Constitutional:      General: She is not in acute distress.    Appearance: She is well-developed. She is not diaphoretic.  HENT:     Head: Normocephalic and atraumatic.     Mouth/Throat:     Pharynx: No oropharyngeal exudate.  Eyes:     Conjunctiva/sclera: Conjunctivae normal.     Pupils: Pupils are equal, round, and reactive to light.  Cardiovascular:     Rate and Rhythm:  Normal rate and regular rhythm.     Heart sounds: Normal heart sounds.  Pulmonary:     Effort: Pulmonary effort is normal.     Breath sounds: Normal breath sounds.  Abdominal:     General: Bowel sounds are normal.     Palpations: Abdomen is soft.  Musculoskeletal:        General: No tenderness.     Cervical back: Normal range of motion and neck supple.  Skin:    General:  Skin is warm and dry.  Neurological:     Mental Status: She is alert.  Psychiatric:        Cognition and Memory: Cognition is impaired. Memory is impaired. She exhibits impaired recent memory.     Comments: Quiet during visit, son and daughter answer questions and provide history      Labs reviewed: Basic Metabolic Panel: Recent Labs    05/14/19 0000 07/16/19 1052 09/11/19 1008 09/21/19 1519  NA 142  --   --  137  K 4.3  --   --  4.1  CL 103  --   --  103  CO2 25*  --   --  28  GLUCOSE  --   --   --  90  BUN 11  --   --  16  CREATININE 1.1  --   --  0.95*  CALCIUM 10.0  --   --  10.4  TSH  --  36.76* 8.45*  --    Liver Function Tests: Recent Labs    05/14/19 0000  AST 15  ALT 6*  ALKPHOS 61  ALBUMIN 4.1   No results for input(s): LIPASE, AMYLASE in the last 8760 hours. No results for input(s): AMMONIA in the last 8760 hours. CBC: Recent Labs    09/21/19 1519  WBC 6.4  NEUTROABS 3,661  HGB 12.3  HCT 38.3  MCV 89.3  PLT 221   Lipid Panel: Recent Labs    05/14/19 0000  CHOL 200  HDL 85*  TRIG 54   TSH: Recent Labs    07/16/19 1052 09/11/19 1008  TSH 36.76* 8.45*   A1C: Lab Results  Component Value Date   HGBA1C 6.1 (H) 02/11/2014     Assessment/Plan 1. Insomnia, unspecified type -encouraged to restart trazodone 50 mg daily at bedtime in attempt to wean xanax at this time.   2. Dementia with behavioral disturbance, unspecified dementia type ([place]) Progressive decline, continues on namenda 10 mg BID, daughter and son are caregivers fort pt.   3. Generalized anxiety  disorder Stable but does have some anxiety, she is on buspar BID and alprazolam, will attempt dose reduction on alprazolam at this time. To xanax 0.25 mg twice daily, if does well on this for 2 weeks can reduced to daily and then to use just as needed.  4. Essential hypertension Slightly elevated today, daughter reports sbp no highter than 120 at home. Will continue norvasc 5 mg daily  - CMP with eGFR([facility]) - CBC with Differential/Platelet  5. Mixed hyperlipidemia Continues on zetia,  - Lipid Panel  6. Acquired hypothyroidism Continues on synthroid 100 mcg - TSH  7. Need for influenza vaccination - Flu Vaccine QUAD High Dose(Fluad)  8. Chronic idiopathic constipation Ongoing, drinks plenty of water and has added benefiber but not having good results. Can add miralax daily as needed.   9. Coronary artery disease due to lipid rich plaque Without complaints of chest pains, continues on ASA 81 mg daily     Next appt: 3 months, sooner if needed Kayah Hecker K. Casey, [facility] [phone]

## 2020-02-16 LAB — CBC WITH DIFFERENTIAL/PLATELET
Absolute Monocytes: 262 cells/uL (ref 200–950)
Basophils Absolute: 51 cells/uL (ref 0–200)
Basophils Relative: 1.1 %
Eosinophils Absolute: 78 cells/uL (ref 15–500)
Eosinophils Relative: 1.7 %
HCT: 39.4 % (ref 35.0–45.0)
Hemoglobin: 12.9 g/dL (ref 11.7–15.5)
Lymphs Abs: 1495 cells/uL (ref 850–3900)
MCH: 29.4 pg (ref 27.0–33.0)
MCHC: 32.7 g/dL (ref 32.0–36.0)
MCV: 89.7 fL (ref 80.0–100.0)
MPV: 9 fL (ref 7.5–12.5)
Monocytes Relative: 5.7 %
Neutro Abs: 2714 cells/uL (ref 1500–7800)
Neutrophils Relative %: 59 %
Platelets: 236 10*3/uL (ref 140–400)
RBC: 4.39 10*6/uL (ref 3.80–5.10)
RDW: 14.2 % (ref 11.0–15.0)
Total Lymphocyte: 32.5 %
WBC: 4.6 10*3/uL (ref 3.8–10.8)

## 2020-02-16 LAB — COMPLETE METABOLIC PANEL WITH GFR
AG Ratio: 1.4 (calc) (ref 1.0–2.5)
ALT: 7 U/L (ref 6–29)
AST: 9 U/L — ABNORMAL LOW (ref 10–35)
Albumin: 4.1 g/dL (ref 3.6–5.1)
Alkaline phosphatase (APISO): 36 U/L — ABNORMAL LOW (ref 37–153)
BUN/Creatinine Ratio: 11 (calc) (ref 6–22)
BUN: 10 mg/dL (ref 7–25)
CO2: 28 mmol/L (ref 20–32)
Calcium: 10.2 mg/dL (ref 8.6–10.4)
Chloride: 107 mmol/L (ref 98–110)
Creat: 0.94 mg/dL — ABNORMAL HIGH (ref 0.60–0.88)
GFR, Est African American: 64 mL/min/{1.73_m2} (ref >=60)
GFR, Est Non African American: 55 mL/min/{1.73_m2} — ABNORMAL LOW (ref >=60)
Globulin: 3 g/dL (calc) (ref 1.9–3.7)
Glucose, Bld: 83 mg/dL (ref 65–99)
Potassium: 4.3 mmol/L (ref 3.5–5.3)
Sodium: 142 mmol/L (ref 135–146)
Total Bilirubin: 0.5 mg/dL (ref 0.2–1.2)
Total Protein: 7.1 g/dL (ref 6.1–8.1)

## 2020-02-16 LAB — LIPID PANEL
Cholesterol: 177 mg/dL (ref <200)
HDL: 67 mg/dL (ref >=50)
LDL Cholesterol (Calc): 96 mg/dL (calc)
Non-HDL Cholesterol (Calc): 110 mg/dL (calc) (ref <130)
Total CHOL/HDL Ratio: 2.6 (calc) (ref <5.0)
Triglycerides: 49 mg/dL (ref <150)

## 2020-02-16 LAB — TSH: TSH: 0.51 mIU/L (ref 0.40–4.50)

## 2020-02-21 ENCOUNTER — Other Ambulatory Visit: Payer: Self-pay

## 2020-02-21 ENCOUNTER — Inpatient Hospital Stay (HOSPITAL_COMMUNITY): Payer: Medicare PPO

## 2020-02-21 ENCOUNTER — Encounter (HOSPITAL_COMMUNITY)
Admission: EM | Disposition: A | Discharge status: Home Health | Payer: Self-pay | Source: Home / Self Care | Attending: Internal Medicine

## 2020-02-21 ENCOUNTER — Encounter (HOSPITAL_COMMUNITY): Payer: Self-pay | Admitting: Emergency Medicine

## 2020-02-21 ENCOUNTER — Other Ambulatory Visit: Payer: Self-pay | Admitting: Family

## 2020-02-21 ENCOUNTER — Inpatient Hospital Stay (HOSPITAL_COMMUNITY): Payer: Medicare PPO | Admitting: Anesthesiology

## 2020-02-21 ENCOUNTER — Inpatient Hospital Stay (HOSPITAL_COMMUNITY)
Admission: EM | Admit: 2020-02-21 | Discharge: 2020-02-27 | DRG: 522 | Disposition: A | Discharge status: Home Health | Payer: Medicare PPO | Attending: Internal Medicine | Admitting: Internal Medicine

## 2020-02-21 ENCOUNTER — Emergency Department (HOSPITAL_COMMUNITY): Payer: Medicare PPO

## 2020-02-21 DIAGNOSIS — F0390 Unspecified dementia without behavioral disturbance: Secondary | ICD-10-CM | POA: Diagnosis present

## 2020-02-21 DIAGNOSIS — Z7982 Long term (current) use of aspirin: Secondary | ICD-10-CM | POA: Diagnosis not present

## 2020-02-21 DIAGNOSIS — W010XXA Fall on same level from slipping, tripping and stumbling without subsequent striking against object, initial encounter: Secondary | ICD-10-CM | POA: Diagnosis present

## 2020-02-21 DIAGNOSIS — Z419 Encounter for procedure for purposes other than remedying health state, unspecified: Secondary | ICD-10-CM | POA: Diagnosis not present

## 2020-02-21 DIAGNOSIS — Z79899 Other long term (current) drug therapy: Secondary | ICD-10-CM | POA: Diagnosis not present

## 2020-02-21 DIAGNOSIS — S72001A Fracture of unspecified part of neck of right femur, initial encounter for closed fracture: Secondary | ICD-10-CM | POA: Diagnosis not present

## 2020-02-21 DIAGNOSIS — W19XXXA Unspecified fall, initial encounter: Secondary | ICD-10-CM | POA: Diagnosis not present

## 2020-02-21 DIAGNOSIS — F419 Anxiety disorder, unspecified: Secondary | ICD-10-CM | POA: Diagnosis present

## 2020-02-21 DIAGNOSIS — Z79890 Hormone replacement therapy (postmenopausal): Secondary | ICD-10-CM

## 2020-02-21 DIAGNOSIS — Z833 Family history of diabetes mellitus: Secondary | ICD-10-CM | POA: Diagnosis not present

## 2020-02-21 DIAGNOSIS — E785 Hyperlipidemia, unspecified: Secondary | ICD-10-CM | POA: Diagnosis present

## 2020-02-21 DIAGNOSIS — M25561 Pain in right knee: Secondary | ICD-10-CM | POA: Diagnosis present

## 2020-02-21 DIAGNOSIS — R443 Hallucinations, unspecified: Secondary | ICD-10-CM | POA: Diagnosis present

## 2020-02-21 DIAGNOSIS — I6529 Occlusion and stenosis of unspecified carotid artery: Secondary | ICD-10-CM | POA: Diagnosis present

## 2020-02-21 DIAGNOSIS — Z6829 Body mass index (BMI) 29.0-29.9, adult: Secondary | ICD-10-CM

## 2020-02-21 DIAGNOSIS — R61 Generalized hyperhidrosis: Secondary | ICD-10-CM | POA: Diagnosis not present

## 2020-02-21 DIAGNOSIS — R001 Bradycardia, unspecified: Secondary | ICD-10-CM | POA: Diagnosis not present

## 2020-02-21 DIAGNOSIS — I255 Ischemic cardiomyopathy: Secondary | ICD-10-CM | POA: Diagnosis present

## 2020-02-21 DIAGNOSIS — S72011A Unspecified intracapsular fracture of right femur, initial encounter for closed fracture: Principal | ICD-10-CM | POA: Diagnosis present

## 2020-02-21 DIAGNOSIS — R451 Restlessness and agitation: Secondary | ICD-10-CM | POA: Diagnosis not present

## 2020-02-21 DIAGNOSIS — N390 Urinary tract infection, site not specified: Secondary | ICD-10-CM | POA: Diagnosis present

## 2020-02-21 DIAGNOSIS — D62 Acute posthemorrhagic anemia: Secondary | ICD-10-CM | POA: Diagnosis not present

## 2020-02-21 DIAGNOSIS — S72009A Fracture of unspecified part of neck of unspecified femur, initial encounter for closed fracture: Secondary | ICD-10-CM | POA: Diagnosis present

## 2020-02-21 DIAGNOSIS — I951 Orthostatic hypotension: Secondary | ICD-10-CM | POA: Diagnosis present

## 2020-02-21 DIAGNOSIS — G4700 Insomnia, unspecified: Secondary | ICD-10-CM

## 2020-02-21 DIAGNOSIS — Z823 Family history of stroke: Secondary | ICD-10-CM

## 2020-02-21 DIAGNOSIS — I10 Essential (primary) hypertension: Secondary | ICD-10-CM | POA: Diagnosis present

## 2020-02-21 DIAGNOSIS — E039 Hypothyroidism, unspecified: Secondary | ICD-10-CM | POA: Diagnosis present

## 2020-02-21 DIAGNOSIS — Z20822 Contact with and (suspected) exposure to covid-19: Secondary | ICD-10-CM | POA: Diagnosis present

## 2020-02-21 DIAGNOSIS — Z8249 Family history of ischemic heart disease and other diseases of the circulatory system: Secondary | ICD-10-CM

## 2020-02-21 DIAGNOSIS — E669 Obesity, unspecified: Secondary | ICD-10-CM | POA: Diagnosis present

## 2020-02-21 DIAGNOSIS — R42 Dizziness and giddiness: Secondary | ICD-10-CM | POA: Diagnosis present

## 2020-02-21 DIAGNOSIS — I2510 Atherosclerotic heart disease of native coronary artery without angina pectoris: Secondary | ICD-10-CM | POA: Diagnosis present

## 2020-02-21 DIAGNOSIS — N179 Acute kidney failure, unspecified: Secondary | ICD-10-CM | POA: Diagnosis not present

## 2020-02-21 DIAGNOSIS — Y92009 Unspecified place in unspecified non-institutional (private) residence as the place of occurrence of the external cause: Secondary | ICD-10-CM

## 2020-02-21 DIAGNOSIS — Z951 Presence of aortocoronary bypass graft: Secondary | ICD-10-CM | POA: Diagnosis not present

## 2020-02-21 DIAGNOSIS — I252 Old myocardial infarction: Secondary | ICD-10-CM | POA: Diagnosis not present

## 2020-02-21 DIAGNOSIS — Z66 Do not resuscitate: Secondary | ICD-10-CM | POA: Diagnosis present

## 2020-02-21 HISTORY — PX: HIP ARTHROPLASTY: SHX981

## 2020-02-21 HISTORY — DX: Unspecified dementia, unspecified severity, without behavioral disturbance, psychotic disturbance, mood disturbance, and anxiety: F03.90

## 2020-02-21 LAB — BASIC METABOLIC PANEL
Anion gap: 8 (ref 5–15)
BUN: 14 mg/dL (ref 8–23)
CO2: 27 mmol/L (ref 22–32)
Calcium: 10.1 mg/dL (ref 8.9–10.3)
Chloride: 104 mmol/L (ref 98–111)
Creatinine, Ser: 1.16 mg/dL — ABNORMAL HIGH (ref 0.44–1.00)
GFR, Estimated: 43 mL/min — ABNORMAL LOW (ref >60)
Glucose, Bld: 97 mg/dL (ref 70–99)
Potassium: 4.2 mmol/L (ref 3.5–5.1)
Sodium: 139 mmol/L (ref 135–145)

## 2020-02-21 LAB — CBC WITH DIFFERENTIAL/PLATELET
Abs Immature Granulocytes: 0.05 10*3/uL (ref 0.00–0.07)
Basophils Absolute: 0 10*3/uL (ref 0.0–0.1)
Basophils Relative: 1 %
Eosinophils Absolute: 0.1 10*3/uL (ref 0.0–0.5)
Eosinophils Relative: 1 %
HCT: 39.5 % (ref 36.0–46.0)
Hemoglobin: 12.9 g/dL (ref 12.0–15.0)
Immature Granulocytes: 1 %
Lymphocytes Relative: 19 %
Lymphs Abs: 1.6 10*3/uL (ref 0.7–4.0)
MCH: 29.7 pg (ref 26.0–34.0)
MCHC: 32.7 g/dL (ref 30.0–36.0)
MCV: 91 fL (ref 80.0–100.0)
Monocytes Absolute: 0.4 10*3/uL (ref 0.1–1.0)
Monocytes Relative: 5 %
Neutro Abs: 6.1 10*3/uL (ref 1.7–7.7)
Neutrophils Relative %: 73 %
Platelets: 196 10*3/uL (ref 150–400)
RBC: 4.34 MIL/uL (ref 3.87–5.11)
RDW: 15.6 % — ABNORMAL HIGH (ref 11.5–15.5)
WBC: 8.2 10*3/uL (ref 4.0–10.5)
nRBC: 0 % (ref 0.0–0.2)

## 2020-02-21 LAB — RESPIRATORY PANEL BY RT PCR (FLU A&B, COVID)
Influenza A by PCR: NEGATIVE
Influenza B by PCR: NEGATIVE
SARS Coronavirus 2 by RT PCR: NEGATIVE

## 2020-02-21 LAB — URINALYSIS, ROUTINE W REFLEX MICROSCOPIC
Bilirubin Urine: NEGATIVE
Glucose, UA: NEGATIVE mg/dL
Hgb urine dipstick: NEGATIVE
Ketones, ur: NEGATIVE mg/dL
Nitrite: NEGATIVE
Protein, ur: NEGATIVE mg/dL
Specific Gravity, Urine: 1.008 (ref 1.005–1.030)
pH: 8 (ref 5.0–8.0)

## 2020-02-21 SURGERY — HEMIARTHROPLASTY, HIP, DIRECT ANTERIOR APPROACH, FOR FRACTURE
Anesthesia: Spinal | Site: Hip | Laterality: Right

## 2020-02-21 MED ORDER — LATANOPROST 0.005 % OP SOLN
1.0000 [drp] | Freq: Every day | OPHTHALMIC | Status: DC
  Administered 2020-02-21 – 2020-02-26 (×5): 1 [drp] via OPHTHALMIC
  Filled 2020-02-21: qty 2.5

## 2020-02-21 MED ORDER — HYDROCODONE-ACETAMINOPHEN 5-325 MG PO TABS
1.0000 | ORAL_TABLET | ORAL | Status: DC | PRN
  Administered 2020-02-22 – 2020-02-26 (×2): 1 via ORAL
  Filled 2020-02-21 (×3): qty 1

## 2020-02-21 MED ORDER — DOCUSATE SODIUM 100 MG PO CAPS
100.0000 mg | ORAL_CAPSULE | Freq: Two times a day (BID) | ORAL | Status: DC
  Administered 2020-02-21 – 2020-02-27 (×11): 100 mg via ORAL
  Filled 2020-02-21 (×11): qty 1

## 2020-02-21 MED ORDER — TRANEXAMIC ACID-NACL 1000-0.7 MG/100ML-% IV SOLN
INTRAVENOUS | Status: AC
  Filled 2020-02-21: qty 100

## 2020-02-21 MED ORDER — 0.9 % SODIUM CHLORIDE (POUR BTL) OPTIME
TOPICAL | Status: DC | PRN
  Administered 2020-02-21: 1000 mL

## 2020-02-21 MED ORDER — METOCLOPRAMIDE HCL 5 MG PO TABS: 5.0000 mg | ORAL_TABLET | Freq: Three times a day (TID) | ORAL | Status: DC | PRN

## 2020-02-21 MED ORDER — MEMANTINE HCL 10 MG PO TABS
10.0000 mg | ORAL_TABLET | Freq: Two times a day (BID) | ORAL | Status: DC
  Administered 2020-02-21 – 2020-02-24 (×6): 10 mg via ORAL
  Filled 2020-02-21 (×6): qty 1

## 2020-02-21 MED ORDER — TRAZODONE HCL 100 MG PO TABS
100.0000 mg | ORAL_TABLET | Freq: Every day | ORAL | Status: DC
  Administered 2020-02-21 – 2020-02-23 (×3): 100 mg via ORAL
  Filled 2020-02-21 (×3): qty 1

## 2020-02-21 MED ORDER — OXYCODONE HCL 5 MG/5ML PO SOLN: 5.0000 mg | Freq: Once | ORAL | Status: DC | PRN

## 2020-02-21 MED ORDER — KETOROLAC TROMETHAMINE 30 MG/ML IJ SOLN
INTRAMUSCULAR | Status: DC | PRN
  Administered 2020-02-21: 30 mg

## 2020-02-21 MED ORDER — CHLORHEXIDINE GLUCONATE CLOTH 2 % EX PADS
6.0000 | MEDICATED_PAD | Freq: Every day | CUTANEOUS | Status: DC
  Administered 2020-02-23: 6 via TOPICAL

## 2020-02-21 MED ORDER — AMLODIPINE BESYLATE 10 MG PO TABS
10.0000 mg | ORAL_TABLET | Freq: Every day | ORAL | Status: DC
  Administered 2020-02-21 – 2020-02-27 (×7): 10 mg via ORAL
  Filled 2020-02-21 (×7): qty 1

## 2020-02-21 MED ORDER — PROPOFOL 1000 MG/100ML IV EMUL
INTRAVENOUS | Status: AC
  Filled 2020-02-21: qty 100

## 2020-02-21 MED ORDER — DEXAMETHASONE SODIUM PHOSPHATE 10 MG/ML IJ SOLN
INTRAMUSCULAR | Status: AC
  Filled 2020-02-21: qty 1

## 2020-02-21 MED ORDER — STERILE WATER FOR IRRIGATION IR SOLN
Status: DC | PRN
  Administered 2020-02-21: 2000 mL

## 2020-02-21 MED ORDER — BUSPIRONE HCL 5 MG PO TABS
15.0000 mg | ORAL_TABLET | Freq: Two times a day (BID) | ORAL | Status: DC
  Administered 2020-02-21 – 2020-02-27 (×11): 15 mg via ORAL
  Filled 2020-02-21 (×11): qty 3

## 2020-02-21 MED ORDER — OXYCODONE HCL 5 MG PO TABS: 5.0000 mg | ORAL_TABLET | Freq: Once | ORAL | Status: DC | PRN

## 2020-02-21 MED ORDER — LACTATED RINGERS IV SOLN: INTRAVENOUS | Status: DC

## 2020-02-21 MED ORDER — ONDANSETRON HCL 4 MG/2ML IJ SOLN: 4.0000 mg | Freq: Once | INTRAMUSCULAR | Status: DC | PRN

## 2020-02-21 MED ORDER — DEXAMETHASONE SODIUM PHOSPHATE 10 MG/ML IJ SOLN
INTRAMUSCULAR | Status: DC | PRN
  Administered 2020-02-21: 8 mg via INTRAVENOUS

## 2020-02-21 MED ORDER — BUPIVACAINE IN DEXTROSE 0.75-8.25 % IT SOLN
INTRATHECAL | Status: DC | PRN
  Administered 2020-02-21: 2 mL via INTRATHECAL

## 2020-02-21 MED ORDER — ONDANSETRON HCL 4 MG/2ML IJ SOLN
INTRAMUSCULAR | Status: DC | PRN
  Administered 2020-02-21: 4 mg via INTRAVENOUS

## 2020-02-21 MED ORDER — ONDANSETRON HCL 4 MG/2ML IJ SOLN
INTRAMUSCULAR | Status: AC
  Filled 2020-02-21: qty 2

## 2020-02-21 MED ORDER — METHOCARBAMOL 500 MG PO TABS: 500.0000 mg | ORAL_TABLET | Freq: Four times a day (QID) | ORAL | Status: DC | PRN

## 2020-02-21 MED ORDER — SODIUM CHLORIDE (PF) 0.9 % IJ SOLN
INTRAMUSCULAR | Status: DC | PRN
  Administered 2020-02-21: 30 mL via INTRAVENOUS

## 2020-02-21 MED ORDER — ASPIRIN 81 MG PO CHEW
81.0000 mg | CHEWABLE_TABLET | Freq: Every day | ORAL | Status: DC
  Administered 2020-02-21 – 2020-02-27 (×7): 81 mg via ORAL
  Filled 2020-02-21 (×7): qty 1

## 2020-02-21 MED ORDER — TRANEXAMIC ACID-NACL 1000-0.7 MG/100ML-% IV SOLN
INTRAVENOUS | Status: DC | PRN
  Administered 2020-02-21: 1000 mg via INTRAVENOUS

## 2020-02-21 MED ORDER — METHOCARBAMOL 1000 MG/10ML IJ SOLN
500.0000 mg | Freq: Four times a day (QID) | INTRAVENOUS | Status: DC | PRN
  Filled 2020-02-21: qty 5

## 2020-02-21 MED ORDER — ACETAMINOPHEN 325 MG PO TABS: 650.0000 mg | ORAL_TABLET | Freq: Four times a day (QID) | ORAL | Status: DC | PRN

## 2020-02-21 MED ORDER — POLYETHYLENE GLYCOL 3350 17 G PO PACK
17.0000 g | PACK | Freq: Every day | ORAL | Status: DC | PRN
  Administered 2020-02-22: 17 g via ORAL
  Filled 2020-02-21 (×2): qty 1

## 2020-02-21 MED ORDER — MENTHOL 3 MG MT LOZG: 1.0000 | LOZENGE | OROMUCOSAL | Status: DC | PRN

## 2020-02-21 MED ORDER — PHENYLEPHRINE HCL-NACL 10-0.9 MG/250ML-% IV SOLN
INTRAVENOUS | Status: DC | PRN
  Administered 2020-02-21: 30 ug/min via INTRAVENOUS

## 2020-02-21 MED ORDER — SODIUM CHLORIDE 0.9 % IR SOLN
Status: DC | PRN
  Administered 2020-02-21: 2000 mL

## 2020-02-21 MED ORDER — LEVOTHYROXINE SODIUM 100 MCG PO TABS
100.0000 ug | ORAL_TABLET | Freq: Every day | ORAL | Status: DC
  Administered 2020-02-22 – 2020-02-27 (×6): 100 ug via ORAL
  Filled 2020-02-21 (×6): qty 1

## 2020-02-21 MED ORDER — ONDANSETRON HCL 4 MG/2ML IJ SOLN: 4.0000 mg | Freq: Four times a day (QID) | INTRAMUSCULAR | Status: DC | PRN

## 2020-02-21 MED ORDER — ISOPROPYL ALCOHOL 70 % SOLN
Status: DC | PRN
  Administered 2020-02-21: 1 via TOPICAL

## 2020-02-21 MED ORDER — ONDANSETRON HCL 4 MG PO TABS: 4.0000 mg | ORAL_TABLET | Freq: Four times a day (QID) | ORAL | Status: DC | PRN

## 2020-02-21 MED ORDER — FENTANYL CITRATE (PF) 100 MCG/2ML IJ SOLN
50.0000 ug | INTRAMUSCULAR | Status: DC | PRN
  Administered 2020-02-21 (×2): 50 ug via INTRAVENOUS
  Filled 2020-02-21 (×2): qty 2

## 2020-02-21 MED ORDER — ENOXAPARIN SODIUM 40 MG/0.4ML ~~LOC~~ SOLN
40.0000 mg | SUBCUTANEOUS | Status: DC
  Administered 2020-02-22 – 2020-02-27 (×6): 40 mg via SUBCUTANEOUS
  Filled 2020-02-21 (×6): qty 0.4

## 2020-02-21 MED ORDER — PHENYLEPHRINE 40 MCG/ML (10ML) SYRINGE FOR IV PUSH (FOR BLOOD PRESSURE SUPPORT)
PREFILLED_SYRINGE | INTRAVENOUS | Status: DC | PRN
  Administered 2020-02-21: 120 ug via INTRAVENOUS

## 2020-02-21 MED ORDER — ACETAMINOPHEN 500 MG PO TABS
500.0000 mg | ORAL_TABLET | Freq: Four times a day (QID) | ORAL | Status: AC
  Administered 2020-02-21 – 2020-02-22 (×3): 500 mg via ORAL
  Filled 2020-02-21 (×3): qty 1

## 2020-02-21 MED ORDER — PROPOFOL 10 MG/ML IV BOLUS
INTRAVENOUS | Status: AC
  Filled 2020-02-21: qty 20

## 2020-02-21 MED ORDER — PHENOL 1.4 % MT LIQD: 1.0000 | OROMUCOSAL | Status: DC | PRN

## 2020-02-21 MED ORDER — FENTANYL CITRATE (PF) 100 MCG/2ML IJ SOLN
INTRAMUSCULAR | Status: AC
  Filled 2020-02-21: qty 2

## 2020-02-21 MED ORDER — ACETAMINOPHEN 10 MG/ML IV SOLN
INTRAVENOUS | Status: AC
  Filled 2020-02-21: qty 100

## 2020-02-21 MED ORDER — SODIUM CHLORIDE 0.9 % IV SOLN
1.0000 g | INTRAVENOUS | Status: DC
  Administered 2020-02-21: 1 g via INTRAVENOUS
  Filled 2020-02-21 (×2): qty 10

## 2020-02-21 MED ORDER — HEPARIN SODIUM (PORCINE) 5000 UNIT/ML IJ SOLN: 5000.0000 [IU] | Freq: Three times a day (TID) | INTRAMUSCULAR | Status: DC

## 2020-02-21 MED ORDER — ACETAMINOPHEN 650 MG RE SUPP: 650.0000 mg | Freq: Four times a day (QID) | RECTAL | Status: DC | PRN

## 2020-02-21 MED ORDER — ACETAMINOPHEN 325 MG PO TABS
325.0000 mg | ORAL_TABLET | Freq: Four times a day (QID) | ORAL | Status: DC | PRN
  Administered 2020-02-22 – 2020-02-26 (×5): 650 mg via ORAL
  Filled 2020-02-21 (×5): qty 2

## 2020-02-21 MED ORDER — PROPOFOL 500 MG/50ML IV EMUL
INTRAVENOUS | Status: DC | PRN
  Administered 2020-02-21: 40 ug/kg/min via INTRAVENOUS
  Administered 2020-02-21: 30 mg via INTRAVENOUS

## 2020-02-21 MED ORDER — ACETAMINOPHEN 10 MG/ML IV SOLN
INTRAVENOUS | Status: DC | PRN
  Administered 2020-02-21: 1000 mg via INTRAVENOUS

## 2020-02-21 MED ORDER — SODIUM CHLORIDE (PF) 0.9 % IJ SOLN
INTRAMUSCULAR | Status: AC
  Filled 2020-02-21: qty 50

## 2020-02-21 MED ORDER — BUPIVACAINE HCL 0.25 % IJ SOLN
INTRAMUSCULAR | Status: DC | PRN
  Administered 2020-02-21: 30 mL

## 2020-02-21 MED ORDER — CEFAZOLIN SODIUM-DEXTROSE 2-4 GM/100ML-% IV SOLN
2.0000 g | Freq: Four times a day (QID) | INTRAVENOUS | Status: AC
  Administered 2020-02-21 (×2): 2 g via INTRAVENOUS
  Filled 2020-02-21 (×2): qty 100

## 2020-02-21 MED ORDER — HYDROCODONE-ACETAMINOPHEN 7.5-325 MG PO TABS
1.0000 | ORAL_TABLET | ORAL | Status: DC | PRN
  Administered 2020-02-23 – 2020-02-26 (×2): 1 via ORAL
  Filled 2020-02-21 (×2): qty 1

## 2020-02-21 MED ORDER — ALPRAZOLAM 0.25 MG PO TABS
0.2500 mg | ORAL_TABLET | Freq: Two times a day (BID) | ORAL | Status: DC | PRN
  Administered 2020-02-22 – 2020-02-27 (×3): 0.25 mg via ORAL
  Filled 2020-02-21 (×4): qty 1

## 2020-02-21 MED ORDER — FENTANYL CITRATE (PF) 100 MCG/2ML IJ SOLN
INTRAMUSCULAR | Status: DC | PRN
  Administered 2020-02-21: 50 ug via INTRAVENOUS

## 2020-02-21 MED ORDER — EZETIMIBE 10 MG PO TABS
10.0000 mg | ORAL_TABLET | Freq: Every day | ORAL | Status: DC
  Administered 2020-02-21 – 2020-02-27 (×7): 10 mg via ORAL
  Filled 2020-02-21 (×7): qty 1

## 2020-02-21 MED ORDER — CEFAZOLIN SODIUM-DEXTROSE 2-3 GM-%(50ML) IV SOLR
INTRAVENOUS | Status: DC | PRN
  Administered 2020-02-21: 2 g via INTRAVENOUS

## 2020-02-21 MED ORDER — KETOROLAC TROMETHAMINE 30 MG/ML IJ SOLN
INTRAMUSCULAR | Status: AC
  Filled 2020-02-21: qty 1

## 2020-02-21 MED ORDER — CEFAZOLIN SODIUM-DEXTROSE 2-4 GM/100ML-% IV SOLN
INTRAVENOUS | Status: AC
  Filled 2020-02-21: qty 100

## 2020-02-21 MED ORDER — DEXMEDETOMIDINE (PRECEDEX) IN NS 20 MCG/5ML (4 MCG/ML) IV SYRINGE
PREFILLED_SYRINGE | INTRAVENOUS | Status: DC | PRN
  Administered 2020-02-21: 4 ug via INTRAVENOUS

## 2020-02-21 MED ORDER — METOCLOPRAMIDE HCL 5 MG/ML IJ SOLN: 5.0000 mg | Freq: Three times a day (TID) | INTRAMUSCULAR | Status: DC | PRN

## 2020-02-21 MED ORDER — MORPHINE SULFATE (PF) 2 MG/ML IV SOLN: 0.5000 mg | INTRAVENOUS | Status: DC | PRN

## 2020-02-21 MED ORDER — FENTANYL CITRATE (PF) 100 MCG/2ML IJ SOLN: 25.0000 ug | INTRAMUSCULAR | Status: DC | PRN

## 2020-02-21 SURGICAL SUPPLY — 57 items
BAG ZIPLOCK 12X15 (MISCELLANEOUS) IMPLANT
CHLORAPREP W/TINT 26 (MISCELLANEOUS) ×3 IMPLANT
CLOTH BEACON ORANGE TIMEOUT ST (SAFETY) ×3 IMPLANT
COVER PERINEAL POST (MISCELLANEOUS) ×3 IMPLANT
COVER SURGICAL LIGHT HANDLE (MISCELLANEOUS) ×3 IMPLANT
COVER WAND RF STERILE (DRAPES) IMPLANT
DECANTER SPIKE VIAL GLASS SM (MISCELLANEOUS) ×3 IMPLANT
DERMABOND ADVANCED (GAUZE/BANDAGES/DRESSINGS) ×4
DERMABOND ADVANCED .7 DNX12 (GAUZE/BANDAGES/DRESSINGS) ×2 IMPLANT
DRAPE SHEET LG 3/4 BI-LAMINATE (DRAPES) ×6 IMPLANT
DRAPE STERI IOBAN 125X83 (DRAPES) ×3 IMPLANT
DRAPE U-SHAPE 47X51 STRL (DRAPES) ×6 IMPLANT
DRSG AQUACEL AG ADV 3.5X10 (GAUZE/BANDAGES/DRESSINGS) ×3 IMPLANT
DRSG TEGADERM 4X4.75 (GAUZE/BANDAGES/DRESSINGS) IMPLANT
ELECT REM PT RETURN 15FT ADLT (MISCELLANEOUS) ×3 IMPLANT
EVACUATOR 1/8 PVC DRAIN (DRAIN) IMPLANT
GAUZE SPONGE 2X2 8PLY STRL LF (GAUZE/BANDAGES/DRESSINGS) IMPLANT
GAUZE SPONGE 4X4 12PLY STRL (GAUZE/BANDAGES/DRESSINGS) ×3 IMPLANT
GLOVE BIO SURGEON STRL SZ7.5 (GLOVE) ×3 IMPLANT
GLOVE BIO SURGEON STRL SZ8.5 (GLOVE) ×6 IMPLANT
GLOVE BIOGEL M STRL SZ7.5 (GLOVE) ×6 IMPLANT
GLOVE BIOGEL PI IND STRL 7.5 (GLOVE) ×1 IMPLANT
GLOVE BIOGEL PI IND STRL 8 (GLOVE) ×1 IMPLANT
GLOVE BIOGEL PI IND STRL 8.5 (GLOVE) ×1 IMPLANT
GLOVE BIOGEL PI INDICATOR 7.5 (GLOVE) ×2
GLOVE BIOGEL PI INDICATOR 8 (GLOVE) ×2
GLOVE BIOGEL PI INDICATOR 8.5 (GLOVE) ×2
GOWN SPEC L3 XXLG W/TWL (GOWN DISPOSABLE) ×3 IMPLANT
GOWN STRL REUS W/ TWL LRG LVL3 (GOWN DISPOSABLE) ×1 IMPLANT
GOWN STRL REUS W/TWL LRG LVL3 (GOWN DISPOSABLE) ×3
HANDPIECE INTERPULSE COAX TIP (DISPOSABLE)
HEAD FEM UNIPOLAR 46 OD STRL (Hips) ×3 IMPLANT
HOOD PEEL AWAY FLYTE STAYCOOL (MISCELLANEOUS) ×3 IMPLANT
JET LAVAGE IRRISEPT WOUND (IRRIGATION / IRRIGATOR) ×3
KIT TURNOVER KIT A (KITS) IMPLANT
LAVAGE JET IRRISEPT WOUND (IRRIGATION / IRRIGATOR) ×1 IMPLANT
MANIFOLD NEPTUNE II (INSTRUMENTS) ×3 IMPLANT
MARKER SKIN DUAL TIP RULER LAB (MISCELLANEOUS) ×3 IMPLANT
NEEDLE SPNL 18GX3.5 QUINCKE PK (NEEDLE) ×3 IMPLANT
PACK ANTERIOR HIP CUSTOM (KITS) ×3 IMPLANT
PENCIL SMOKE EVACUATOR (MISCELLANEOUS) IMPLANT
SAW OSC TIP CART 19.5X105X1.3 (SAW) ×3 IMPLANT
SEALER BIPOLAR AQUA 6.0 (INSTRUMENTS) ×3 IMPLANT
SET HNDPC FAN SPRY TIP SCT (DISPOSABLE) IMPLANT
SOL PREP POV-IOD 4OZ 10% (MISCELLANEOUS) ×3 IMPLANT
SPACER FEM TAPERED +0 12/14 (Hips) ×3 IMPLANT
SPONGE GAUZE 2X2 STER 10/PKG (GAUZE/BANDAGES/DRESSINGS)
STEM TRI LOC GRIPTION SZ 4 STD (Hips) ×1 IMPLANT
SUT ETHIBOND NAB CT1 #1 30IN (SUTURE) ×6 IMPLANT
SUT MNCRL AB 3-0 PS2 18 (SUTURE) ×3 IMPLANT
SUT MNCRL AB 4-0 PS2 18 (SUTURE) ×3 IMPLANT
SUT MON AB 2-0 CT1 36 (SUTURE) ×6 IMPLANT
SUT VIC AB 2-0 CT1 27 (SUTURE) ×3
SUT VIC AB 2-0 CT1 TAPERPNT 27 (SUTURE) ×1 IMPLANT
SUT VLOC 180 0 24IN GS25 (SUTURE) ×3 IMPLANT
SYR 50ML LL SCALE MARK (SYRINGE) IMPLANT
TRI LOC GRIPTION SZ 4 STD (Hips) ×3 IMPLANT

## 2020-02-21 NOTE — Anesthesia Procedure Notes (Signed)
Procedure Name: MAC Date/Time: 02/21/2020 11:33 AM Performed by: Niel Hummer, CRNA Pre-anesthesia Checklist: Patient identified, Emergency Drugs available, Suction available and Patient being monitored Oxygen Delivery Method: Simple face mask

## 2020-02-21 NOTE — ED Notes (Signed)
Patient will be going to short stay first

## 2020-02-21 NOTE — Anesthesia Procedure Notes (Signed)
Spinal  Patient location during procedure: OR Start time: 02/21/2020 11:34 AM End time: 02/21/2020 11:41 AM Staffing Performed: anesthesiologist  Anesthesiologist: Lannie Fields, DO Preanesthetic Checklist Completed: patient identified, IV checked, risks and benefits discussed, surgical consent, monitors and equipment checked, pre-op evaluation and timeout performed Spinal Block Patient position: right lateral decubitus Prep: DuraPrep and site prepped and draped Patient monitoring: cardiac monitor, continuous pulse ox and blood pressure Approach: midline Location: L3-4 Injection technique: single-shot Needle Needle type: Pencan  Needle gauge: 24 G Needle length: 9 cm Assessment Sensory level: T6 Additional Notes Functioning IV was confirmed and monitors were applied. Sterile prep and drape, including hand hygiene and sterile gloves were used. The patient was positioned and the spine was prepped. The skin was anesthetized with lidocaine.  Free flow of clear CSF was obtained prior to injecting local anesthetic into the CSF.  The spinal needle aspirated freely following injection.  The needle was carefully withdrawn.  The patient tolerated the procedure well.

## 2020-02-21 NOTE — Op Note (Signed)
OPERATIVE REPORT  SURGEON: Samson Frederic, MD   ASSISTANT: Barrie Dunker, PA-C  PREOPERATIVE DIAGNOSIS: Displaced Right femoral neck fracture.   POSTOPERATIVE DIAGNOSIS: Displaced Right femoral neck fracture.   PROCEDURE: Right hip hemiarthroplasty, anterior approach.   IMPLANTS: DePuy Tri Lock stem, size 4, std offset, with a +0 mm spacer and a 45 mm monopolar head ball.  ANESTHESIA:  General  ANTIBIOTICS: 2g ancef.  ESTIMATED BLOOD LOSS:-100 mL    DRAINS: None.  COMPLICATIONS: None   CONDITION: PACU - hemodynamically stable.   BRIEF CLINICAL NOTE: [name] [name] is a 86 y.o. female with a displaced Right femoral neck fracture. The patient was admitted to the hospitalist service and underwent perioperative risk stratification and medical optimization. The risks, benefits, and alternatives to hemiarthroplasty were explained, and the patient elected to proceed.  PROCEDURE IN DETAIL: The patient was taken to the operating room and general anesthesia was induced on the hospital bed.  The patient was then positioned on the Hana table.  All bony prominences were well padded.  The hip was prepped and draped in the normal sterile surgical fashion.  A time-out was called verifying side and site of surgery. Antibiotics were given within 60 minutes of beginning the procedure.   The direct anterior approach to the hip was performed through the Hueter interval.  Lateral femoral circumflex vessels were treated with the Auqumantys. The anterior capsule was exposed and an inverted T capsulotomy was made.  Fracture hematoma was encountered and evacuated. The patient was found to have a comminuted Right subcapital femoral neck fracture.  I freshened the femoral neck cut with a saw.  I removed the femoral neck fragment.  A corkscrew was placed into the head and the head was removed.  This was passed to the back table and was measured.   Acetabular exposure was achieved.  I examined the articular  cartilage which was intact.  The labrum was intact. A 45 mm trial head was placed and found to have excellent fit.   I then gained femoral exposure taking care to protect the abductors and greater trochanter.  This was performed using standard external rotation, extension, and adduction.  The capsule was peeled off the inner aspect of the greater trochanter, taking care to preserve the short external rotators. A cookie cutter was used to enter the femoral canal, and then the femoral canal finder was used to confirm location.  I then sequentially broached up to a size 4.  Calcar planer was used on the femoral neck remnant.  I paced a std neck and a 36 + 1.5 head ball. The hip was reduced.  Leg lengths were checked fluoroscopically.  The hip was dislocated and trial components were removed.  I placed the real stem followed by the real spacer and head ball.  A single reduction maneuver was performed and the hip was reduced.  Fluoroscopy was used to confirm component position and leg lengths.  At 90 degrees of external rotation and extension, the hip was stable to an anterior directed force.   The wound was copiously irrigated with Irrisept solution and normal saline using pule lavage.  Marcaine solution was injected into the periarticular soft tissue.  The wound was closed in layers using #1 Vicryl and V-Loc for the fascia, 2-0 Vicryl for the subcutaneous fat, 2-0 Monocryl for the deep dermal layer, 3-0 running Monocryl subcuticular stitch and glue for the skin.  Once the glue was fully dried, an Aquacell Ag dressing was applied.  The patient  was then awakened from anesthesia and transported to the recovery room in stable condition.  Sponge, needle, and instrument counts were correct at the end of the case x2.  The patient tolerated the procedure well and there were no known complications.  Please note that a surgical assistant was a medical necessity for this procedure to perform it in a safe and expeditious  manner. Assistant was necessary to provide appropriate retraction of vital neurovascular structures, to prevent femoral fracture, and to allow for anatomic placement of the prosthesis.

## 2020-02-21 NOTE — Anesthesia Preprocedure Evaluation (Addendum)
Anesthesia Evaluation  Patient identified by MRN, date of birth, ID band Patient awake    Reviewed: Allergy & Precautions, NPO status , Patient's Chart, lab work & pertinent test results  History of Anesthesia Complications Negative for: history of anesthetic complications  Airway Mallampati: I  TM Distance: >3 FB Neck ROM: Full    Dental  (+) Edentulous Upper, Edentulous Lower   Pulmonary neg pulmonary ROS,    Pulmonary exam normal breath sounds clear to auscultation       Cardiovascular hypertension, Pt. on medications + CAD, + Past MI and + CABG  Normal cardiovascular exam Rhythm:Regular Rate:Normal     Neuro/Psych PSYCHIATRIC DISORDERS Anxiety Dementia Carotid stenosis    GI/Hepatic negative GI ROS, Neg liver ROS,   Endo/Other  Hypothyroidism   Renal/GU Renal InsufficiencyRenal disease (Cr 1.16)Cr 1.16, AKI on CKD  negative genitourinary   Musculoskeletal negative musculoskeletal ROS (+)   Abdominal   Peds  Hematology negative hematology ROS (+)   Anesthesia Other Findings  Hgb 12.9, plts 196  TTE 02/12/14: Low normal to mildly reduced LV function; EF 50; grade 1diastolic dysfunction; mild LAE; mild MR and AI; mildly elevated pulmonary pressure.    Reproductive/Obstetrics negative OB ROS                           Anesthesia Physical Anesthesia Plan  ASA: III  Anesthesia Plan: Spinal and MAC   Post-op Pain Management:    Induction:   PONV Risk Score and Plan: 2 and Propofol infusion, Treatment may vary due to age or medical condition and TIVA  Airway Management Planned: Nasal Cannula and Simple Face Mask  Additional Equipment: None  Intra-op Plan:   Post-operative Plan:   Informed Consent: I have reviewed the patients History and Physical, chart, labs and discussed the procedure including the risks, benefits and alternatives for the proposed anesthesia with the  patient or authorized representative who has indicated his/her understanding and acceptance.       Plan Discussed with: CRNA  Anesthesia Plan Comments:       Anesthesia Quick Evaluation

## 2020-02-21 NOTE — Consult Note (Signed)
Reason for Consult: Right hip fracture Referring Physician: [name] is an 86 y.o. female.  HPI: 86 year old female with dementia tripped last night in her home.  Unable to ambulate on the right lower extremity.  Found to have a displaced subcapital femoral neck fracture in the emergency department.  She complains of right hip pain worse with any movement.  Denies other injury with the fall.  Her daughter is present with her and gives the history based on her dementia.  Past Medical History:  Diagnosis Date  . Carotid stenosis    a. Carotid US (10/15):  Bilateral ICA 1-39%  . Coronary artery disease    a. NSTEMI >> LHC (02/11/14):  pLAD 95%, mLAD occl, CFX < 20%, pRCA 30%, EF 35%, ant and apical AK >> CABG  . Dementia (HCC)   . Glucose intolerance (impaired glucose tolerance)    a. A1c 6.1 (01/2014)  . H/O non-ST elevation myocardial infarction (NSTEMI)    01/2014  . HLD (hyperlipidemia)   . Hypertension   . Ischemic cardiomyopathy    a. EF 35% by cath at time of NSTEMI >> b. Echo (10/15):  EF 50% to 55%. Wall motion was normal; Grade 1 diastolic dysfunction.  Mild AI.  Ascending aorta mildly dilated. Mild MR.  Mild LAE.  PA peak pressure: 46 mm Hg (S).    Past Surgical History:  Procedure Laterality Date  . CESAREAN SECTION    . CORONARY ARTERY BYPASS GRAFT N/A 02/15/2014   Procedure: CORONARY ARTERY BYPASS GRAFTING (CABG) x three,  using left internal mammary artery and right leg greater saphenous vein harvested endoscopically;  Surgeon: Alleen Borne, MD;  Location: [facility] OR;  Service: Open Heart Surgery;  Laterality: N/A;  . LEFT HEART CATHETERIZATION WITH CORONARY ANGIOGRAM N/A 02/11/2014   Procedure: LEFT HEART CATHETERIZATION WITH CORONARY ANGIOGRAM;  Surgeon: Peter M Swaziland, MD;  Location: [facility] CATH LAB;  Service: Cardiovascular;  Laterality: N/A;  . TEE WITHOUT CARDIOVERSION N/A 02/15/2014   Procedure: TRANSESOPHAGEAL ECHOCARDIOGRAM (TEE);  Surgeon: Alleen Borne,  MD;  Location: [facility] OR;  Service: Open Heart Surgery;  Laterality: N/A;    Family History  Problem Relation Age of Onset  . Stroke Mother   . Hypertension Mother   . Hypertension Sister   . Diabetes Brother   . Diabetes Sister   . Heart attack Neg Hx   . Breast cancer Neg Hx     Social History:  reports that she has never smoked. She has never used smokeless tobacco. She reports that she does not drink alcohol and does not use drugs.  Allergies: No Known Allergies  Medications: I have reviewed the patient's current medications.  Results for orders placed or performed during the hospital encounter of 02/21/20 (from the past 48 hour(s))  CBC with Differential/Platelet     Status: Abnormal   Collection Time: 02/21/20  4:04 AM  Result Value Ref Range   WBC 8.2 4.0 - 10.5 K/uL   RBC 4.34 3.87 - 5.11 MIL/uL   Hemoglobin 12.9 12.0 - 15.0 g/dL   HCT 50.0 36 - 46 %   MCV 91.0 80.0 - 100.0 fL   MCH 29.7 26.0 - 34.0 pg   MCHC 32.7 30.0 - 36.0 g/dL   RDW 37.0 (H) 48.8 - 89.1 %   Platelets 196 150 - 400 K/uL   nRBC 0.0 0.0 - 0.2 %   Neutrophils Relative % 73 %   Neutro Abs 6.1 1.7 - 7.7 K/uL  Lymphocytes Relative 19 %   Lymphs Abs 1.6 0.7 - 4.0 K/uL   Monocytes Relative 5 %   Monocytes Absolute 0.4 0.1 - 1.0 K/uL   Eosinophils Relative 1 %   Eosinophils Absolute 0.1 0.0 - 0.5 K/uL   Basophils Relative 1 %   Basophils Absolute 0.0 0.0 - 0.1 K/uL   Immature Granulocytes 1 %   Abs Immature Granulocytes 0.05 0.00 - 0.07 K/uL    Comment: Performed at [facility], [address], [place], [place] [address]  Basic metabolic panel     Status: Abnormal   Collection Time: 02/21/20  4:04 AM  Result Value Ref Range   Sodium 139 135 - 145 mmol/L   Potassium 4.2 3.5 - 5.1 mmol/L   Chloride 104 98 - 111 mmol/L   CO2 27 22 - 32 mmol/L   Glucose, Bld 97 70 - 99 mg/dL    Comment: Glucose reference range applies only to samples taken after fasting for at least 8 hours.    BUN 14 8 - 23 mg/dL   Creatinine, Ser 6.38 (H) 0.44 - 1.00 mg/dL   Calcium 75.6 8.9 - 43.3 mg/dL   GFR, Estimated 43 (L) >60 mL/min   Anion gap 8 5 - 15    Comment: Performed at [facility], [address] [place], [place] [address]    DG Chest 1 View  Result Date: 02/21/2020 CLINICAL DATA:  Preoperative chest.  Fall. EXAM: CHEST  1 VIEW COMPARISON:  01/13/2018 FINDINGS: Postoperative changes in the mediastinum. Shallow inspiration. Diffuse cardiac enlargement. Mild central vascular congestion. No edema or consolidation. No pleural effusions. No pneumothorax. Tortuous aorta. IMPRESSION: Cardiac enlargement with mild central vascular congestion. Electronically Signed   By: Burman Nieves M.D.   On: 02/21/2020 04:24   CT Head Wo Contrast  Result Date: 02/21/2020 CLINICAL DATA:  Mental status change with unknown cause. Fall at home EXAM: CT HEAD WITHOUT CONTRAST CT CERVICAL SPINE WITHOUT CONTRAST TECHNIQUE: Multidetector CT imaging of the head and cervical spine was performed following the standard protocol without intravenous contrast. Multiplanar CT image reconstructions of the cervical spine were also generated. COMPARISON:  None. FINDINGS: CT HEAD FINDINGS Brain: No evidence of acute infarction, hemorrhage, hydrocephalus, extra-axial collection or mass lesion/mass effect. Atrophy that is most notable in the bilateral temporal lobes, correlating with dementia history. Moderate for age chronic small vessel ischemia in the white matter. Vascular: No hyperdense vessel or unexpected calcification. Skull: Negative for fracture Sinuses/Orbits: No evidence of injury. Bilateral cataract resection. Partial bilateral mastoid opacification with negative nasopharynx. CT CERVICAL SPINE FINDINGS Alignment: No traumatic malalignment Skull base and vertebrae: No acute fracture or incidental bone lesion. Incidental T3-4 non segmentation. Soft tissues and spinal canal: No prevertebral fluid or  swelling. No visible canal hematoma. Disc levels: Ordinary degenerative changes. No visible cord impingement Upper chest: Negative IMPRESSION: 1. No evidence of intracranial or cervical spine injury. 2. Temporal predominant brain atrophy in keeping with history of dementia. Electronically Signed   By: Marnee Spring M.D.   On: 02/21/2020 04:37   CT Cervical Spine Wo Contrast  Result Date: 02/21/2020 CLINICAL DATA:  Mental status change with unknown cause. Fall at home EXAM: CT HEAD WITHOUT CONTRAST CT CERVICAL SPINE WITHOUT CONTRAST TECHNIQUE: Multidetector CT imaging of the head and cervical spine was performed following the standard protocol without intravenous contrast. Multiplanar CT image reconstructions of the cervical spine were also generated. COMPARISON:  None. FINDINGS: CT HEAD FINDINGS Brain: No evidence  of acute infarction, hemorrhage, hydrocephalus, extra-axial collection or mass lesion/mass effect. Atrophy that is most notable in the bilateral temporal lobes, correlating with dementia history. Moderate for age chronic small vessel ischemia in the white matter. Vascular: No hyperdense vessel or unexpected calcification. Skull: Negative for fracture Sinuses/Orbits: No evidence of injury. Bilateral cataract resection. Partial bilateral mastoid opacification with negative nasopharynx. CT CERVICAL SPINE FINDINGS Alignment: No traumatic malalignment Skull base and vertebrae: No acute fracture or incidental bone lesion. Incidental T3-4 non segmentation. Soft tissues and spinal canal: No prevertebral fluid or swelling. No visible canal hematoma. Disc levels: Ordinary degenerative changes. No visible cord impingement Upper chest: Negative IMPRESSION: 1. No evidence of intracranial or cervical spine injury. 2. Temporal predominant brain atrophy in keeping with history of dementia. Electronically Signed   By: Marnee Spring M.D.   On: 02/21/2020 04:37   DG Hip Unilat W or Wo Pelvis 2-3 Views  Right  Result Date: 02/21/2020 CLINICAL DATA:  Right hip pain after a fall today EXAM: DG HIP (WITH OR WITHOUT PELVIS) 2-3V RIGHT COMPARISON:  None. FINDINGS: Transverse subcapital fracture of the right femoral neck with varus angulation. No dislocation at the hip joint. Pelvis appears intact. SI joints and symphysis pubis are not displaced. Calcification in the pelvis likely represents a calcified uterine fibroid. IMPRESSION: Transverse subcapital fracture of the right femoral neck with varus angulation. Electronically Signed   By: Burman Nieves M.D.   On: 02/21/2020 04:23    Review of Systems  Unable to perform ROS: Dementia   Blood pressure (!) 148/77, pulse 76, temperature (!) 97.4 F (36.3 C), temperature source Oral, resp. rate 15, SpO2 97 %. Physical Exam Constitutional:      Appearance: She is obese.  HENT:     Head: Atraumatic.  Eyes:     Extraocular Movements: Extraocular movements intact.  Cardiovascular:     Pulses: Normal pulses.  Pulmonary:     Effort: Pulmonary effort is normal.  Musculoskeletal:     Comments: Right lower extremity shortened and externally rotated  Skin:    General: Skin is warm.  Neurological:     Mental Status: She is alert.  Psychiatric:        Mood and Affect: Mood normal.     Assessment/Plan: Right hip displaced femoral neck fracture She is indicated for right hip hemiarthroplasty to decrease pain and promote early ambulation. I have spoken with Dr. Linna Caprice who will be taking over her care and taking her to the operating room today for right hip hemiarthroplasty. I have spoken to the daughter about the need for surgery and she agrees. The patient should remain n.p.o. until surgery.  [name] [name] 02/21/2020, 7:14 AM

## 2020-02-21 NOTE — Addendum Note (Signed)
Addendum  created 02/21/20 1436 by Mal Amabile, MD   SmartForm saved

## 2020-02-21 NOTE — Transfer of Care (Signed)
Immediate Anesthesia Transfer of Care Note  Patient: Dakia Schifano  Procedure(s) Performed: HEMIARTHROPLASTY ANTERIOR APPROACH (Right Hip)  Patient Location: PACU  Anesthesia Type:Spinal  Level of Consciousness: awake  Airway & Oxygen Therapy: Patient Spontanous Breathing and Patient connected to face mask oxygen  Post-op Assessment: Report given to RN and Post -op Vital signs reviewed and stable  Post vital signs: Reviewed and stable  Last Vitals:  Vitals Value Taken Time  BP 124/61 02/21/20 1335  Temp    Pulse 60 02/21/20 1336  Resp 17 02/21/20 1336  SpO2 96 % 02/21/20 1336  Vitals shown include unvalidated device data.  Last Pain:  Vitals:   02/21/20 1048  TempSrc: Oral  PainSc:          Complications: No complications documented.

## 2020-02-21 NOTE — ED Triage Notes (Signed)
Patient BIB EMS for fall at home. Patient has dementia and is currently at baseline, able to point to where she has pain, complains of right hip pain that radiates to right knee. Patient currently sleeping in NAD, VSS.

## 2020-02-21 NOTE — H&P (Signed)
History and Physical    Zanaya Baize XTG:[id number] DOB: 1933-01-28 DOA: 02/21/2020  PCP: [name], NP  Patient coming from: Home  Chief Complaint: Fall  HPI: Airlie Blumenberg is a 86 y.o. female with medical history significant of dementia. Hx per daughter at bedside. She reports that the patient has become more confused over the past week or so. She is normally has intermittent periods of lucidness, but that has not happened over the past week. She has had more hallucinations (seeing people that aren't there) and has been more agitated. This morning she woke up and went to the living room around 2:20AM. Her daughter heard a crash and her mother call out for help. She found her mother had fallen on her right side and could not get up. She called for her brother's assistance to get her up. They called for EMS to transport to the ED.   ED Course: The patient was found to have a hip fracture. Orthopedics was called. [facility] was called for admission.   Review of Systems:  Unable to obtain d/t mentation.   PMHx Past Medical History:  Diagnosis Date  . Carotid stenosis    a. Carotid US (10/15):  Bilateral ICA 1-39%  . Coronary artery disease    a. NSTEMI >> LHC (02/11/14):  pLAD 95%, mLAD occl, CFX < 20%, pRCA 30%, EF 35%, ant and apical AK >> CABG  . Dementia (HCC)   . Glucose intolerance (impaired glucose tolerance)    a. A1c 6.1 (01/2014)  . H/O non-ST elevation myocardial infarction (NSTEMI)    01/2014  . HLD (hyperlipidemia)   . Hypertension   . Ischemic cardiomyopathy    a. EF 35% by cath at time of NSTEMI >> b. Echo (10/15):  EF 50% to 55%. Wall motion was normal; Grade 1 diastolic dysfunction.  Mild AI.  Ascending aorta mildly dilated. Mild MR.  Mild LAE.  PA peak pressure: 46 mm Hg (S).    PSHx Past Surgical History:  Procedure Laterality Date  . CESAREAN SECTION    . CORONARY ARTERY BYPASS GRAFT N/A 02/15/2014   Procedure: CORONARY ARTERY BYPASS GRAFTING (CABG) x three,   using left internal mammary artery and right leg greater saphenous vein harvested endoscopically;  Surgeon: Alleen Borne, MD;  Location: [facility] OR;  Service: Open Heart Surgery;  Laterality: N/A;  . LEFT HEART CATHETERIZATION WITH CORONARY ANGIOGRAM N/A 02/11/2014   Procedure: LEFT HEART CATHETERIZATION WITH CORONARY ANGIOGRAM;  Surgeon: Peter M Swaziland, MD;  Location: [facility] CATH LAB;  Service: Cardiovascular;  Laterality: N/A;  . TEE WITHOUT CARDIOVERSION N/A 02/15/2014   Procedure: TRANSESOPHAGEAL ECHOCARDIOGRAM (TEE);  Surgeon: Alleen Borne, MD;  Location: [facility] OR;  Service: Open Heart Surgery;  Laterality: N/A;    SocHx  reports that she has never smoked. She has never used smokeless tobacco. She reports that she does not drink alcohol and does not use drugs.  No Known Allergies  FamHx Family History  Problem Relation Age of Onset  . Stroke Mother   . Hypertension Mother   . Hypertension Sister   . Diabetes Brother   . Diabetes Sister   . Heart attack Neg Hx   . Breast cancer Neg Hx     Prior to Admission medications   Medication Sig Start Date End Date Taking? Authorizing Provider  acetaminophen (TYLENOL) 500 MG tablet Take 1,000 mg by mouth every 6 (six) hours as needed (pain).   Yes [provider]  ALPRAZolam Prudy Feeler) 0.25 MG  tablet Take 1 tablet (0.25 mg total) by mouth 2 (two) times daily as needed for anxiety. 02/15/20  Yes [name], NP  amLODipine (NORVASC) 10 MG tablet Take 10 mg by mouth daily. 01/02/20  Yes [provider]  aspirin 81 MG chewable tablet Chew 81 mg by mouth daily.   Yes [provider]  busPIRone (BUSPAR) 30 MG tablet Take 15 mg by mouth 2 (two) times daily.    Yes [provider]  Calcium-Magnesium-Vitamin D 600-40-500 MG-MG-UNIT TB24 Take 1 tablet by mouth daily.   Yes [provider]  ezetimibe (ZETIA) 10 MG tablet Take 10 mg by mouth daily.   Yes [provider]  latanoprost (XALATAN) 0.005 %  ophthalmic solution Place 1 drop into both eyes at bedtime.    Yes [provider]  levothyroxine (SYNTHROID) 100 MCG tablet TAKE 1 TABLET BY MOUTH DAILY BEFORE BREAKFAST. Patient taking differently: Take 100 mcg by mouth daily before breakfast.  11/23/19  Yes [name], Dinah C, NP  memantine (NAMENDA) 10 MG tablet TAKE 1 TABLET BY MOUTH TWICE A DAY Patient taking differently: Take 10 mg by mouth 2 (two) times daily.  12/11/19  Yes [name], Dinah C, NP  traZODone (DESYREL) 100 MG tablet Take 1 tablet (100 mg total) by mouth at bedtime. 11/26/19 02/24/20 Yes [name], Dinah C, NP    Physical Exam: Vitals:   02/21/20 0730 02/21/20 0800 02/21/20 0930 02/21/20 1009  BP: (!) 135/118 (!) 163/71 (!) 144/92   Pulse: 81 83 90   Resp: (!) 27 (!) 24 (!) 32   Temp:   98.2 F (36.8 C)   TempSrc:      SpO2: 99% 96% 99%   Weight:    66.7 kg  Height:    4\' 11"  (1.499 m)    General: 86 y.o. female resting in bed in NAD Eyes: PERRL, normal sclera ENMT: Nares patent w/o discharge, orophaynx clear, dentition normal, ears w/o discharge/lesions/ulcers Neck: Supple, trachea midline Cardiovascular: RRR, +S1, S2, no m/g/r, equal pulses throughout Respiratory: CTABL, no w/r/r, normal WOB GI: BS+, NDNT, no masses noted, no organomegaly noted MSK: No e/c/c Skin: No rashes, bruises, ulcerations noted Neuro: alert, but not oriented to name, place, or person. She is not making sense in her speech right now. Psyc: somewhat agitated; not cooperative  Labs on Admission: I have personally reviewed following labs and imaging studies  CBC: Recent Labs  Lab 02/15/20 1136 02/21/20 0404  WBC 4.6 8.2  NEUTROABS 2,714 6.1  HGB 12.9 12.9  HCT 39.4 39.5  MCV 89.7 91.0  PLT 236 196   Basic Metabolic Panel: Recent Labs  Lab 02/15/20 1136 02/21/20 0404  NA 142 139  K 4.3 4.2  CL 107 104  CO2 28 27  GLUCOSE 83 97  BUN 10 14  CREATININE 0.94* 1.16*  CALCIUM 10.2 10.1   GFR: Estimated Creatinine  Clearance: 29.4 mL/min (A) (by C-G formula based on SCr of 1.16 mg/dL (H)). Liver Function Tests: Recent Labs  Lab 02/15/20 1136  AST 9*  ALT 7  BILITOT 0.5  PROT 7.1   No results for input(s): LIPASE, AMYLASE in the last 168 hours. No results for input(s): AMMONIA in the last 168 hours. Coagulation Profile: No results for input(s): INR, PROTIME in the last 168 hours. Cardiac Enzymes: No results for input(s): CKTOTAL, CKMB, CKMBINDEX, TROPONINI in the last 168 hours. BNP (last 3 results) No results for input(s): PROBNP in the last 8760 hours. HbA1C: No results for  input(s): HGBA1C in the last 72 hours. CBG: No results for input(s): GLUCAP in the last 168 hours. Lipid Profile: No results for input(s): CHOL, HDL, LDLCALC, TRIG, CHOLHDL, LDLDIRECT in the last 72 hours. Thyroid Function Tests: No results for input(s): TSH, T4TOTAL, FREET4, T3FREE, THYROIDAB in the last 72 hours. Anemia Panel: No results for input(s): VITAMINB12, FOLATE, FERRITIN, TIBC, IRON, RETICCTPCT in the last 72 hours. Urine analysis:    Component Value Date/Time   COLORURINE YELLOW 02/21/2020 0706   APPEARANCEUR HAZY (A) 02/21/2020 0706   LABSPEC 1.008 02/21/2020 0706   PHURINE 8.0 02/21/2020 0706   GLUCOSEU NEGATIVE 02/21/2020 0706   HGBUR NEGATIVE 02/21/2020 0706   BILIRUBINUR NEGATIVE 02/21/2020 0706   BILIRUBINUR Negative 09/21/2019 1635   KETONESUR NEGATIVE 02/21/2020 0706   PROTEINUR NEGATIVE 02/21/2020 0706   UROBILINOGEN 0.2 09/21/2019 1635   NITRITE NEGATIVE 02/21/2020 0706   LEUKOCYTESUR MODERATE (A) 02/21/2020 0706    Radiological Exams on Admission: DG Chest 1 View  Result Date: 02/21/2020 CLINICAL DATA:  Preoperative chest.  Fall. EXAM: CHEST  1 VIEW COMPARISON:  01/13/2018 FINDINGS: Postoperative changes in the mediastinum. Shallow inspiration. Diffuse cardiac enlargement. Mild central vascular congestion. No edema or consolidation. No pleural effusions. No pneumothorax. Tortuous  aorta. IMPRESSION: Cardiac enlargement with mild central vascular congestion. Electronically Signed   By: Burman Nieves M.D.   On: 02/21/2020 04:24   CT Head Wo Contrast  Result Date: 02/21/2020 CLINICAL DATA:  Mental status change with unknown cause. Fall at home EXAM: CT HEAD WITHOUT CONTRAST CT CERVICAL SPINE WITHOUT CONTRAST TECHNIQUE: Multidetector CT imaging of the head and cervical spine was performed following the standard protocol without intravenous contrast. Multiplanar CT image reconstructions of the cervical spine were also generated. COMPARISON:  None. FINDINGS: CT HEAD FINDINGS Brain: No evidence of acute infarction, hemorrhage, hydrocephalus, extra-axial collection or mass lesion/mass effect. Atrophy that is most notable in the bilateral temporal lobes, correlating with dementia history. Moderate for age chronic small vessel ischemia in the white matter. Vascular: No hyperdense vessel or unexpected calcification. Skull: Negative for fracture Sinuses/Orbits: No evidence of injury. Bilateral cataract resection. Partial bilateral mastoid opacification with negative nasopharynx. CT CERVICAL SPINE FINDINGS Alignment: No traumatic malalignment Skull base and vertebrae: No acute fracture or incidental bone lesion. Incidental T3-4 non segmentation. Soft tissues and spinal canal: No prevertebral fluid or swelling. No visible canal hematoma. Disc levels: Ordinary degenerative changes. No visible cord impingement Upper chest: Negative IMPRESSION: 1. No evidence of intracranial or cervical spine injury. 2. Temporal predominant brain atrophy in keeping with history of dementia. Electronically Signed   By: Marnee Spring M.D.   On: 02/21/2020 04:37   CT Cervical Spine Wo Contrast  Result Date: 02/21/2020 CLINICAL DATA:  Mental status change with unknown cause. Fall at home EXAM: CT HEAD WITHOUT CONTRAST CT CERVICAL SPINE WITHOUT CONTRAST TECHNIQUE: Multidetector CT imaging of the head and cervical  spine was performed following the standard protocol without intravenous contrast. Multiplanar CT image reconstructions of the cervical spine were also generated. COMPARISON:  None. FINDINGS: CT HEAD FINDINGS Brain: No evidence of acute infarction, hemorrhage, hydrocephalus, extra-axial collection or mass lesion/mass effect. Atrophy that is most notable in the bilateral temporal lobes, correlating with dementia history. Moderate for age chronic small vessel ischemia in the white matter. Vascular: No hyperdense vessel or unexpected calcification. Skull: Negative for fracture Sinuses/Orbits: No evidence of injury. Bilateral cataract resection. Partial bilateral mastoid opacification with negative nasopharynx. CT CERVICAL SPINE FINDINGS Alignment: No traumatic malalignment Skull base  and vertebrae: No acute fracture or incidental bone lesion. Incidental T3-4 non segmentation. Soft tissues and spinal canal: No prevertebral fluid or swelling. No visible canal hematoma. Disc levels: Ordinary degenerative changes. No visible cord impingement Upper chest: Negative IMPRESSION: 1. No evidence of intracranial or cervical spine injury. 2. Temporal predominant brain atrophy in keeping with history of dementia. Electronically Signed   By: Marnee SpringJonathon  Watts M.D.   On: 02/21/2020 04:37   DG Hip Unilat W or Wo Pelvis 2-3 Views Right  Result Date: 02/21/2020 CLINICAL DATA:  Right hip pain after a fall today EXAM: DG HIP (WITH OR WITHOUT PELVIS) 2-3V RIGHT COMPARISON:  None. FINDINGS: Transverse subcapital fracture of the right femoral neck with varus angulation. No dislocation at the hip joint. Pelvis appears intact. SI joints and symphysis pubis are not displaced. Calcification in the pelvis likely represents a calcified uterine fibroid. IMPRESSION: Transverse subcapital fracture of the right femoral neck with varus angulation. Electronically Signed   By: Burman NievesWilliam  Stevens M.D.   On: 02/21/2020 04:23    EKG: Independently  reviewed. NSR  Assessment/Plan Fall Subcapital fracture of the right femoral neck      - Ortho consulted, to take to OR, appreciate assistance     - PT/OT     - DVT PPx choice/duration per ortho  Hx of dementia Worsening confusion     - continue namenda, buspar, xanax  Hypothyroidism     - continue levothyroxine  HTN     - continue norvasc  HLD     - continue zetia  UTI?     - UA is concerning for UTI     - check UCx     - add rocephin  DVT prophylaxis: heparin  Code Status: DNR; confirmed with dtr at bedside  Family Communication: with dtr at bedside  Consults called: EDP called orthopedics  Admission status: Inpatient  Status is: Inpatient  Remains inpatient appropriate because:Inpatient level of care appropriate due to severity of illness   Dispo: The patient is from: Home              Anticipated d/c is to: Home              Anticipated d/c date is: 3 days              Patient currently is not medically stable to d/c.  Teddy Spikeyrone A Aveion Nguyen DO [facility]  If 7PM-7AM, please contact night-coverage [email or web address]  02/21/2020, 10:15 AM

## 2020-02-21 NOTE — Discharge Instructions (Signed)
°Dr. Renly Roots °Joint Replacement Specialist °[facility] °[address] °[place], [place] [address] °[phone] ° ° °TOTAL HIP REPLACEMENT POSTOPERATIVE DIRECTIONS ° ° ° °Hip Rehabilitation, Guidelines Following Surgery  ° °WEIGHT BEARING °Weight bearing as tolerated with assist device (walker, cane, etc) as directed, use it as long as suggested by your surgeon or therapist, typically at least 4-6 weeks. ° °The results of a hip operation are greatly improved after range of motion and muscle strengthening exercises. Follow all safety measures which are given to protect your hip. If any of these exercises cause increased pain or swelling in your joint, decrease the amount until you are comfortable again. Then slowly increase the exercises. Call your caregiver if you have problems or questions.  ° °HOME CARE INSTRUCTIONS  °Most of the following instructions are designed to prevent the dislocation of your new hip.  °Remove items at home which could result in a fall. This includes throw rugs or furniture in walking pathways.  °Continue medications as instructed at time of discharge. °· You may have some home medications which will be placed on hold until you complete the course of blood thinner medication. °· You may start showering once you are discharged home. Do not remove your dressing. °Do not put on socks or shoes without following the instructions of your caregivers.   °Sit on chairs with arms. Use the chair arms to help push yourself up when arising.  °Arrange for the use of a toilet seat elevator so you are not sitting low.  °· Walk with walker as instructed.  °You may resume a sexual relationship in one month or when given the OK by your caregiver.  °Use walker as long as suggested by your caregivers.  °You may put full weight on your legs and walk as much as is comfortable. °Avoid periods of inactivity such as sitting longer than an hour when not asleep. This helps prevent  blood clots.  °You may return to work once you are cleared by your surgeon.  °Do not drive a car for 6 weeks or until released by your surgeon.  °Do not drive while taking narcotics.  °Wear elastic stockings for two weeks following surgery during the day but you may remove then at night.  °Make sure you keep all of your appointments after your operation with all of your doctors and caregivers. You should call the office at the above phone number and make an appointment for approximately two weeks after the date of your surgery. °Please pick up a stool softener and laxative for home use as long as you are requiring pain medications. °· ICE to the affected hip every three hours for 30 minutes at a time and then as needed for pain and swelling. Continue to use ice on the hip for pain and swelling from surgery. You may notice swelling that will progress down to the foot and ankle.  This is normal after surgery.  Elevate the leg when you are not up walking on it.   °It is important for you to complete the blood thinner medication as prescribed by your doctor. °· Continue to use the breathing machine which will help keep your temperature down.  It is common for your temperature to cycle up and down following surgery, especially at night when you are not up moving around and exerting yourself.  The breathing machine keeps your lungs expanded and your temperature down. ° °RANGE OF MOTION AND STRENGTHENING EXERCISES  °These exercises are   designed to help you keep full movement of your hip joint. Follow your caregiver's or physical therapist's instructions. Perform all exercises about fifteen times, three times per day or as directed. Exercise both hips, even if you have had only one joint replacement. These exercises can be done on a training (exercise) mat, on the floor, on a table or on a bed. Use whatever works the best and is most comfortable for you. Use music or television while you are exercising so that the exercises  are a pleasant break in your day. This will make your life better with the exercises acting as a break in routine you can look forward to.  °Lying on your back, slowly slide your foot toward your buttocks, raising your knee up off the floor. Then slowly slide your foot back down until your leg is straight again.  °Lying on your back spread your legs as far apart as you can without causing discomfort.  °Lying on your side, raise your upper leg and foot straight up from the floor as far as is comfortable. Slowly lower the leg and repeat.  °Lying on your back, tighten up the muscle in the front of your thigh (quadriceps muscles). You can do this by keeping your leg straight and trying to raise your heel off the floor. This helps strengthen the largest muscle supporting your knee.  °Lying on your back, tighten up the muscles of your buttocks both with the legs straight and with the knee bent at a comfortable angle while keeping your heel on the floor.  ° °SKILLED REHAB INSTRUCTIONS: °If the patient is transferred to a skilled rehab facility following release from the hospital, a list of the current medications will be sent to the facility for the patient to continue.  When discharged from the skilled rehab facility, please have the facility set up the patient's Home Health Physical Therapy prior to being released. Also, the skilled facility will be responsible for providing the patient with their medications at time of release from the facility to include their pain medication and their blood thinner medication. If the patient is still at the rehab facility at time of the two week follow up appointment, the skilled rehab facility will also need to assist the patient in arranging follow up appointment in our office and any transportation needs. ° °MAKE SURE YOU:  °Understand these instructions.  °Will watch your condition.  °Will get help right away if you are not doing well or get worse. ° °Pick up stool softner and  laxative for home use following surgery while on pain medications. °Do not remove your dressing. °The dressing is waterproof--it is OK to take showers. °Continue to use ice for pain and swelling after surgery. °Do not use any lotions or creams on the incision until instructed by your surgeon. °Total Hip Protocol. ° ° °

## 2020-02-21 NOTE — Anesthesia Postprocedure Evaluation (Signed)
Anesthesia Post Note  Patient: [name] [name]  Procedure(s) Performed: HEMIARTHROPLASTY ANTERIOR APPROACH (Right Hip)     Patient location during evaluation: PACU Anesthesia Type: Spinal Level of consciousness: awake Pain management: pain level controlled Vital Signs Assessment: post-procedure vital signs reviewed and stable Respiratory status: spontaneous breathing, respiratory function stable and nonlabored ventilation Cardiovascular status: blood pressure returned to baseline and stable Postop Assessment: no headache, no backache, no apparent nausea or vomiting and spinal receding Anesthetic complications: no   No complications documented.  Last Vitals:  Vitals:   02/21/20 1415 02/21/20 1430  BP: 133/74 123/78  Pulse: 65 67  Resp: 17 16  Temp:  36.4 C  SpO2: 95% 95%    Last Pain:  Vitals:   02/21/20 1430  TempSrc:   PainSc: Asleep                 [name]

## 2020-02-21 NOTE — ED Notes (Signed)
Reported to short stay last intake and that daughter is in room to sign consents. Short stay will send for patient soon

## 2020-02-21 NOTE — ED Provider Notes (Signed)
[facility]-EMERGENCY DEPT Provider Note: [name] Dell, MD, FACEP  CSN: [id number] MRN: [id number] ARRIVAL: 02/21/20 at 0335 ROOM: WA18/WA18   CHIEF COMPLAINT  Fall  Level five caveat: Dementia HISTORY OF PRESENT ILLNESS  02/21/20 3:56 AM [name] [name] is a 86 y.o. female who fell at home just prior to arrival.  EMS reports she was complaining of pain in her right knee.  She is unable to give me any history due to dementia.  There is shortening of the right lower extremity but no other obvious signs of injury.  On arrival the patient is somnolent and minimally responsive even to painful stimuli.   Past Medical History:  Diagnosis Date  . Carotid stenosis    a. Carotid US (10/15):  Bilateral ICA 1-39%  . Coronary artery disease    a. NSTEMI >> LHC (02/11/14):  pLAD 95%, mLAD occl, CFX < 20%, pRCA 30%, EF 35%, ant and apical AK >> CABG  . Dementia (HCC)   . Glucose intolerance (impaired glucose tolerance)    a. A1c 6.1 (01/2014)  . H/O non-ST elevation myocardial infarction (NSTEMI)    01/2014  . HLD (hyperlipidemia)   . Hypertension   . Ischemic cardiomyopathy    a. EF 35% by cath at time of NSTEMI >> b. Echo (10/15):  EF 50% to 55%. Wall motion was normal; Grade 1 diastolic dysfunction.  Mild AI.  Ascending aorta mildly dilated. Mild MR.  Mild LAE.  PA peak pressure: 46 mm Hg (S).    Past Surgical History:  Procedure Laterality Date  . CESAREAN SECTION    . CORONARY ARTERY BYPASS GRAFT N/A 02/15/2014   Procedure: CORONARY ARTERY BYPASS GRAFTING (CABG) x three,  using left internal mammary artery and right leg greater saphenous vein harvested endoscopically;  Surgeon: Alleen Borne, MD;  Location: [facility] OR;  Service: Open Heart Surgery;  Laterality: N/A;  . LEFT HEART CATHETERIZATION WITH CORONARY ANGIOGRAM N/A 02/11/2014   Procedure: LEFT HEART CATHETERIZATION WITH CORONARY ANGIOGRAM;  Surgeon: Peter M Swaziland, MD;  Location: [facility] CATH LAB;  Service: Cardiovascular;  Laterality: N/A;   . TEE WITHOUT CARDIOVERSION N/A 02/15/2014   Procedure: TRANSESOPHAGEAL ECHOCARDIOGRAM (TEE);  Surgeon: Alleen Borne, MD;  Location: [facility] OR;  Service: Open Heart Surgery;  Laterality: N/A;    Family History  Problem Relation Age of Onset  . Stroke Mother   . Hypertension Mother   . Hypertension Sister   . Diabetes Brother   . Diabetes Sister   . Heart attack Neg Hx   . Breast cancer Neg Hx     Social History   Tobacco Use  . Smoking status: Never Smoker  . Smokeless tobacco: Never Used  Substance Use Topics  . Alcohol use: No  . Drug use: No    Prior to Admission medications   Medication Sig Start Date End Date Taking? Authorizing Provider  acetaminophen (TYLENOL) 500 MG tablet Take 1,000 mg by mouth every 6 (six) hours as needed (pain).   Yes [provider]  ALPRAZolam (XANAX) 0.25 MG tablet Take 1 tablet (0.25 mg total) by mouth 2 (two) times daily as needed for anxiety. 02/15/20  Yes [name], NP  amLODipine (NORVASC) 10 MG tablet Take 10 mg by mouth daily. 01/02/20  Yes [provider]  aspirin 81 MG chewable tablet Chew 81 mg by mouth daily.   Yes [provider]  busPIRone (BUSPAR) 30 MG tablet Take 15 mg by mouth 2 (two) times daily.  Yes [provider]  Calcium-Magnesium-Vitamin D 600-40-500 MG-MG-UNIT TB24 Take 1 tablet by mouth daily.   Yes [provider]  ezetimibe (ZETIA) 10 MG tablet Take 10 mg by mouth daily.   Yes [provider]  latanoprost (XALATAN) 0.005 % ophthalmic solution Place 1 drop into both eyes at bedtime.    Yes [provider]  levothyroxine (SYNTHROID) 100 MCG tablet TAKE 1 TABLET BY MOUTH DAILY BEFORE BREAKFAST. Patient taking differently: Take 100 mcg by mouth daily before breakfast.  11/23/19  Yes [name], Dinah C, NP  memantine (NAMENDA) 10 MG tablet TAKE 1 TABLET BY MOUTH TWICE A DAY Patient taking differently: Take 10 mg by mouth 2 (two) times daily.  12/11/19  Yes  [name], Dinah C, NP  traZODone (DESYREL) 100 MG tablet Take 1 tablet (100 mg total) by mouth at bedtime. 11/26/19 02/24/20 Yes [name], Dinah C, NP    Allergies Patient has no known allergies.   REVIEW OF SYSTEMS  Negative except as noted here or in the History of Present Illness.   PHYSICAL EXAMINATION  Initial Vital Signs Blood pressure (!) 162/83, pulse 65, temperature (!) 97.4 F (36.3 C), temperature source Oral, resp. rate 14, SpO2 96 %.  Examination General: Well-developed, well-nourished female in no acute distress; appearance consistent with age of record HENT: normocephalic; atraumatic Eyes: pupils equal, round and reactive to light; bilateral pseudophakia Neck: supple Heart: regular rate and rhythm; diastolic murmur at the left upper sternal border Lungs: clear to auscultation bilaterally Abdomen: soft; nondistended; nontender; bowel sounds present Extremities: Right lower extremity shortened, externally rotated; pulses normal; trace edema of lower legs; no apparent pain on passive range of motion Neurologic: Somnolent; minimal response to sternal rub; positive gag reflex Skin: Warm and dry   RESULTS  Summary of this visit's results, reviewed and interpreted by myself:   EKG Interpretation  Date/Time:    Ventricular Rate:    PR Interval:    QRS Duration:   QT Interval:    QTC Calculation:   R Axis:     Text Interpretation:        Laboratory Studies: Results for orders placed or performed during the hospital encounter of 02/21/20 (from the past 24 hour(s))  CBC with Differential/Platelet     Status: Abnormal   Collection Time: 02/21/20  4:04 AM  Result Value Ref Range   WBC 8.2 4.0 - 10.5 K/uL   RBC 4.34 3.87 - 5.11 MIL/uL   Hemoglobin 12.9 12.0 - 15.0 g/dL   HCT 16.139.5 36 - 46 %   MCV 91.0 80.0 - 100.0 fL   MCH 29.7 26.0 - 34.0 pg   MCHC 32.7 30.0 - 36.0 g/dL   RDW 09.615.6 (H) 04.511.5 - 40.915.5 %   Platelets 196 150 - 400 K/uL   nRBC 0.0 0.0 - 0.2 %    Neutrophils Relative % 73 %   Neutro Abs 6.1 1.7 - 7.7 K/uL   Lymphocytes Relative 19 %   Lymphs Abs 1.6 0.7 - 4.0 K/uL   Monocytes Relative 5 %   Monocytes Absolute 0.4 0.1 - 1.0 K/uL   Eosinophils Relative 1 %   Eosinophils Absolute 0.1 0.0 - 0.5 K/uL   Basophils Relative 1 %   Basophils Absolute 0.0 0.0 - 0.1 K/uL   Immature Granulocytes 1 %   Abs Immature Granulocytes 0.05 0.00 - 0.07 K/uL  Basic metabolic panel     Status: Abnormal   Collection Time: 02/21/20  4:04 AM  Result Value Ref  Range   Sodium 139 135 - 145 mmol/L   Potassium 4.2 3.5 - 5.1 mmol/L   Chloride 104 98 - 111 mmol/L   CO2 27 22 - 32 mmol/L   Glucose, Bld 97 70 - 99 mg/dL   BUN 14 8 - 23 mg/dL   Creatinine, Ser 4.91 (H) 0.44 - 1.00 mg/dL   Calcium 79.1 8.9 - 50.5 mg/dL   GFR, Estimated 43 (L) >60 mL/min   Anion gap 8 5 - 15   Imaging Studies: DG Chest 1 View  Result Date: 02/21/2020 CLINICAL DATA:  Preoperative chest.  Fall. EXAM: CHEST  1 VIEW COMPARISON:  01/13/2018 FINDINGS: Postoperative changes in the mediastinum. Shallow inspiration. Diffuse cardiac enlargement. Mild central vascular congestion. No edema or consolidation. No pleural effusions. No pneumothorax. Tortuous aorta. IMPRESSION: Cardiac enlargement with mild central vascular congestion. Electronically Signed   By: Burman Nieves M.D.   On: 02/21/2020 04:24   CT Head Wo Contrast  Result Date: 02/21/2020 CLINICAL DATA:  Mental status change with unknown cause. Fall at home EXAM: CT HEAD WITHOUT CONTRAST CT CERVICAL SPINE WITHOUT CONTRAST TECHNIQUE: Multidetector CT imaging of the head and cervical spine was performed following the standard protocol without intravenous contrast. Multiplanar CT image reconstructions of the cervical spine were also generated. COMPARISON:  None. FINDINGS: CT HEAD FINDINGS Brain: No evidence of acute infarction, hemorrhage, hydrocephalus, extra-axial collection or mass lesion/mass effect. Atrophy that is most notable  in the bilateral temporal lobes, correlating with dementia history. Moderate for age chronic small vessel ischemia in the white matter. Vascular: No hyperdense vessel or unexpected calcification. Skull: Negative for fracture Sinuses/Orbits: No evidence of injury. Bilateral cataract resection. Partial bilateral mastoid opacification with negative nasopharynx. CT CERVICAL SPINE FINDINGS Alignment: No traumatic malalignment Skull base and vertebrae: No acute fracture or incidental bone lesion. Incidental T3-4 non segmentation. Soft tissues and spinal canal: No prevertebral fluid or swelling. No visible canal hematoma. Disc levels: Ordinary degenerative changes. No visible cord impingement Upper chest: Negative IMPRESSION: 1. No evidence of intracranial or cervical spine injury. 2. Temporal predominant brain atrophy in keeping with history of dementia. Electronically Signed   By: Marnee Spring M.D.   On: 02/21/2020 04:37   CT Cervical Spine Wo Contrast  Result Date: 02/21/2020 CLINICAL DATA:  Mental status change with unknown cause. Fall at home EXAM: CT HEAD WITHOUT CONTRAST CT CERVICAL SPINE WITHOUT CONTRAST TECHNIQUE: Multidetector CT imaging of the head and cervical spine was performed following the standard protocol without intravenous contrast. Multiplanar CT image reconstructions of the cervical spine were also generated. COMPARISON:  None. FINDINGS: CT HEAD FINDINGS Brain: No evidence of acute infarction, hemorrhage, hydrocephalus, extra-axial collection or mass lesion/mass effect. Atrophy that is most notable in the bilateral temporal lobes, correlating with dementia history. Moderate for age chronic small vessel ischemia in the white matter. Vascular: No hyperdense vessel or unexpected calcification. Skull: Negative for fracture Sinuses/Orbits: No evidence of injury. Bilateral cataract resection. Partial bilateral mastoid opacification with negative nasopharynx. CT CERVICAL SPINE FINDINGS Alignment: No  traumatic malalignment Skull base and vertebrae: No acute fracture or incidental bone lesion. Incidental T3-4 non segmentation. Soft tissues and spinal canal: No prevertebral fluid or swelling. No visible canal hematoma. Disc levels: Ordinary degenerative changes. No visible cord impingement Upper chest: Negative IMPRESSION: 1. No evidence of intracranial or cervical spine injury. 2. Temporal predominant brain atrophy in keeping with history of dementia. Electronically Signed   By: Marnee Spring M.D.   On: 02/21/2020 04:37  DG Hip Unilat W or Wo Pelvis 2-3 Views Right  Result Date: 02/21/2020 CLINICAL DATA:  Right hip pain after a fall today EXAM: DG HIP (WITH OR WITHOUT PELVIS) 2-3V RIGHT COMPARISON:  None. FINDINGS: Transverse subcapital fracture of the right femoral neck with varus angulation. No dislocation at the hip joint. Pelvis appears intact. SI joints and symphysis pubis are not displaced. Calcification in the pelvis likely represents a calcified uterine fibroid. IMPRESSION: Transverse subcapital fracture of the right femoral neck with varus angulation. Electronically Signed   By: Burman Nieves M.D.   On: 02/21/2020 04:23    ED COURSE and MDM  Nursing notes, initial and subsequent vitals signs, including pulse oximetry, reviewed and interpreted by myself.  Vitals:   02/21/20 0349 02/21/20 0400 02/21/20 0435  BP: (!) 162/83 (!) 155/81 (!) 153/81  Pulse: 65 65 69  Resp: 14 17 (!) 21  Temp: (!) 97.4 F (36.3 C)    TempSrc: Oral    SpO2: 96% 96% 97%   Medications - No data to display  5:00 AM Patient now more awake and conversant.  No evidence of acute intracranial injury on CT scan.  5:18 AM Dr. Ave Filter of orthopedics to see patient for her hip fracture.  Dr. Toniann Fail to admit to the hospitalist service.  Discussed with the patient's son who states the patient's dementia has seemed to have taken a turn for the worse over the past week and she was somewhat agitated  overnight, not wanting to sleep and ultimately fell.  PROCEDURES  Procedures   ED DIAGNOSES     ICD-10-CM   1. Fall in home, initial encounter  W19.XXXA    Y92.009   2. Closed subcapital fracture of right femur, initial encounter (HCC)  S72.011A        Paula Libra, MD 02/21/20 [phone]

## 2020-02-21 NOTE — Consult Note (Signed)
ORTHOPAEDIC CONSULTATION  REQUESTING PHYSICIAN: Teddy Spike, DO  PCP:  [name], NP  Chief Complaint: displaced right femoral neck fracture  HPI: [name] [name] is a 86 y.o. female with PMHx of dementia, CAD, HTN who presents with right hip pain after she tripped and fell last night in her home.  Unable to ambulate on the right lower extremity.  Found to have a displaced subcapital femoral neck fracture in the emergency department.  She complains of right hip pain worse with any movement.  Denies other injury with the fall.  Her daughter is present with her and gives the history based on her dementia.  I was asked to take over by Dr. Ave Filter.  Past Medical History:  Diagnosis Date  . Carotid stenosis    a. Carotid US (10/15):  Bilateral ICA 1-39%  . Coronary artery disease    a. NSTEMI >> LHC (02/11/14):  pLAD 95%, mLAD occl, CFX < 20%, pRCA 30%, EF 35%, ant and apical AK >> CABG  . Dementia (HCC)   . Glucose intolerance (impaired glucose tolerance)    a. A1c 6.1 (01/2014)  . H/O non-ST elevation myocardial infarction (NSTEMI)    01/2014  . HLD (hyperlipidemia)   . Hypertension   . Ischemic cardiomyopathy    a. EF 35% by cath at time of NSTEMI >> b. Echo (10/15):  EF 50% to 55%. Wall motion was normal; Grade 1 diastolic dysfunction.  Mild AI.  Ascending aorta mildly dilated. Mild MR.  Mild LAE.  PA peak pressure: 46 mm Hg (S).   Past Surgical History:  Procedure Laterality Date  . CESAREAN SECTION    . CORONARY ARTERY BYPASS GRAFT N/A 02/15/2014   Procedure: CORONARY ARTERY BYPASS GRAFTING (CABG) x three,  using left internal mammary artery and right leg greater saphenous vein harvested endoscopically;  Surgeon: Alleen Borne, MD;  Location: [facility] OR;  Service: Open Heart Surgery;  Laterality: N/A;  . LEFT HEART CATHETERIZATION WITH CORONARY ANGIOGRAM N/A 02/11/2014   Procedure: LEFT HEART CATHETERIZATION WITH CORONARY ANGIOGRAM;  Surgeon: Peter M Swaziland, MD;   Location: [facility] CATH LAB;  Service: Cardiovascular;  Laterality: N/A;  . TEE WITHOUT CARDIOVERSION N/A 02/15/2014   Procedure: TRANSESOPHAGEAL ECHOCARDIOGRAM (TEE);  Surgeon: Alleen Borne, MD;  Location: [facility] OR;  Service: Open Heart Surgery;  Laterality: N/A;   Social History   Socioeconomic History  . Marital status: Single    Spouse name: Not on file  . Number of children: Not on file  . Years of education: Not on file  . Highest education level: Not on file  Occupational History  . Not on file  Tobacco Use  . Smoking status: Never Smoker  . Smokeless tobacco: Never Used  Substance and Sexual Activity  . Alcohol use: No  . Drug use: No  . Sexual activity: Not on file  Other Topics Concern  . Not on file  Social History Narrative  . Not on file   Social Determinants of Health   Financial Resource Strain:   . Difficulty of Paying Living Expenses: Not on file  Food Insecurity:   . Worried About Programme researcher, broadcasting/film/video in the Last Year: Not on file  . Ran Out of Food in the Last Year: Not on file  Transportation Needs:   . Lack of Transportation (Medical): Not on file  . Lack of Transportation (Non-Medical): Not on file  Physical Activity:   . Days of Exercise per Week: Not on  file  . Minutes of Exercise per Session: Not on file  Stress:   . Feeling of Stress : Not on file  Social Connections:   . Frequency of Communication with Friends and Family: Not on file  . Frequency of Social Gatherings with Friends and Family: Not on file  . Attends Religious Services: Not on file  . Active Member of Clubs or Organizations: Not on file  . Attends Banker Meetings: Not on file  . Marital Status: Not on file   Family History  Problem Relation Age of Onset  . Stroke Mother   . Hypertension Mother   . Hypertension Sister   . Diabetes Brother   . Diabetes Sister   . Heart attack Neg Hx   . Breast cancer Neg Hx    No Known Allergies Prior to Admission medications    Medication Sig Start Date End Date Taking? Authorizing Provider  acetaminophen (TYLENOL) 500 MG tablet Take 1,000 mg by mouth every 6 (six) hours as needed (pain).   Yes [provider]  ALPRAZolam (XANAX) 0.25 MG tablet Take 1 tablet (0.25 mg total) by mouth 2 (two) times daily as needed for anxiety. 02/15/20  Yes [name], NP  amLODipine (NORVASC) 10 MG tablet Take 10 mg by mouth daily. 01/02/20  Yes [provider]  aspirin 81 MG chewable tablet Chew 81 mg by mouth daily.   Yes [provider]  busPIRone (BUSPAR) 30 MG tablet Take 15 mg by mouth 2 (two) times daily.    Yes [provider]  Calcium-Magnesium-Vitamin D 600-40-500 MG-MG-UNIT TB24 Take 1 tablet by mouth daily.   Yes [provider]  ezetimibe (ZETIA) 10 MG tablet Take 10 mg by mouth daily.   Yes [provider]  latanoprost (XALATAN) 0.005 % ophthalmic solution Place 1 drop into both eyes at bedtime.    Yes [provider]  levothyroxine (SYNTHROID) 100 MCG tablet TAKE 1 TABLET BY MOUTH DAILY BEFORE BREAKFAST. Patient taking differently: Take 100 mcg by mouth daily before breakfast.  11/23/19  Yes [name], Dinah C, NP  memantine (NAMENDA) 10 MG tablet TAKE 1 TABLET BY MOUTH TWICE A DAY Patient taking differently: Take 10 mg by mouth 2 (two) times daily.  12/11/19  Yes [name], Dinah C, NP  traZODone (DESYREL) 100 MG tablet Take 1 tablet (100 mg total) by mouth at bedtime. 11/26/19 02/24/20 Yes [name], [name], NP   DG Chest 1 View  Result Date: 02/21/2020 CLINICAL DATA:  Preoperative chest.  Fall. EXAM: CHEST  1 VIEW COMPARISON:  01/13/2018 FINDINGS: Postoperative changes in the mediastinum. Shallow inspiration. Diffuse cardiac enlargement. Mild central vascular congestion. No edema or consolidation. No pleural effusions. No pneumothorax. Tortuous aorta. IMPRESSION: Cardiac enlargement with mild central vascular congestion. Electronically Signed   By: Burman Nieves M.D.   On: 02/21/2020 04:24   CT Head Wo Contrast  Result Date: 02/21/2020 CLINICAL DATA:  Mental status change with unknown cause. Fall at home EXAM: CT HEAD WITHOUT CONTRAST CT CERVICAL SPINE WITHOUT CONTRAST TECHNIQUE: Multidetector CT imaging of the head and cervical spine was performed following the standard protocol without intravenous contrast. Multiplanar CT image reconstructions of the cervical spine were also generated. COMPARISON:  None. FINDINGS: CT HEAD FINDINGS Brain: No evidence of acute infarction, hemorrhage, hydrocephalus, extra-axial collection or mass lesion/mass effect. Atrophy that is most notable in the bilateral temporal lobes, correlating with dementia history. Moderate for age chronic small vessel ischemia in the white matter. Vascular:  No hyperdense vessel or unexpected calcification. Skull: Negative for fracture Sinuses/Orbits: No evidence of injury. Bilateral cataract resection. Partial bilateral mastoid opacification with negative nasopharynx. CT CERVICAL SPINE FINDINGS Alignment: No traumatic malalignment Skull base and vertebrae: No acute fracture or incidental bone lesion. Incidental T3-4 non segmentation. Soft tissues and spinal canal: No prevertebral fluid or swelling. No visible canal hematoma. Disc levels: Ordinary degenerative changes. No visible cord impingement Upper chest: Negative IMPRESSION: 1. No evidence of intracranial or cervical spine injury. 2. Temporal predominant brain atrophy in keeping with history of dementia. Electronically Signed   By: Marnee Spring M.D.   On: 02/21/2020 04:37   CT Cervical Spine Wo Contrast  Result Date: 02/21/2020 CLINICAL DATA:  Mental status change with unknown cause. Fall at home EXAM: CT HEAD WITHOUT CONTRAST CT CERVICAL SPINE WITHOUT CONTRAST TECHNIQUE: Multidetector CT imaging of the head and cervical spine was performed following the standard protocol without intravenous contrast. Multiplanar CT image  reconstructions of the cervical spine were also generated. COMPARISON:  None. FINDINGS: CT HEAD FINDINGS Brain: No evidence of acute infarction, hemorrhage, hydrocephalus, extra-axial collection or mass lesion/mass effect. Atrophy that is most notable in the bilateral temporal lobes, correlating with dementia history. Moderate for age chronic small vessel ischemia in the white matter. Vascular: No hyperdense vessel or unexpected calcification. Skull: Negative for fracture Sinuses/Orbits: No evidence of injury. Bilateral cataract resection. Partial bilateral mastoid opacification with negative nasopharynx. CT CERVICAL SPINE FINDINGS Alignment: No traumatic malalignment Skull base and vertebrae: No acute fracture or incidental bone lesion. Incidental T3-4 non segmentation. Soft tissues and spinal canal: No prevertebral fluid or swelling. No visible canal hematoma. Disc levels: Ordinary degenerative changes. No visible cord impingement Upper chest: Negative IMPRESSION: 1. No evidence of intracranial or cervical spine injury. 2. Temporal predominant brain atrophy in keeping with history of dementia. Electronically Signed   By: Marnee Spring M.D.   On: 02/21/2020 04:37   DG Hip Unilat W or Wo Pelvis 2-3 Views Right  Result Date: 02/21/2020 CLINICAL DATA:  Right hip pain after a fall today EXAM: DG HIP (WITH OR WITHOUT PELVIS) 2-3V RIGHT COMPARISON:  None. FINDINGS: Transverse subcapital fracture of the right femoral neck with varus angulation. No dislocation at the hip joint. Pelvis appears intact. SI joints and symphysis pubis are not displaced. Calcification in the pelvis likely represents a calcified uterine fibroid. IMPRESSION: Transverse subcapital fracture of the right femoral neck with varus angulation. Electronically Signed   By: Burman Nieves M.D.   On: 02/21/2020 04:23    Positive ROS: All other systems have been reviewed and were otherwise negative with the exception of those mentioned in the HPI  and as above.  Physical Exam: General: Alert, no acute distress Cardiovascular: No pedal edema Respiratory: No cyanosis, no use of accessory musculature GI: No organomegaly, abdomen is soft and non-tender Skin: No lesions in the area of chief complaint Neurologic: Sensation intact distally Psychiatric: Patient is competent for consent with normal mood and affect Lymphatic: No axillary or cervical lymphadenopathy  MUSCULOSKELETAL: No skin wounds. Shortened and externally rotated. Foot perfused. Unable to assess motor/sensory due to MS.  Assessment: Displaced right femoral neck fracture Advanced dementia  Plan: I discussed the findings with the patient's daughter.  She has an unstable right femoral neck fracture, which would benefit from surgical intervention.  We discussed the risk, benefits, and alternatives to right hip hemiarthroplasty.  Please see statement of risk.  Plan for surgery later today.  All questions were solicited and  answered.  The risks, benefits, and alternatives were discussed with the patient. There are risks associated with the surgery including, but not limited to, problems with anesthesia (death), infection, instability (giving out of the joint), dislocation, differences in leg length/angulation/rotation, fracture of bones, loosening or failure of implants, hematoma (blood accumulation) which may require surgical drainage, blood clots, pulmonary embolism, nerve injury (foot drop and lateral thigh numbness), and blood vessel injury. The patient understands these risks and elects to proceed.   Jonette PesaBrian J Dennie Moltz, MD [phone]    02/21/2020 10:09 AM

## 2020-02-22 LAB — COMPREHENSIVE METABOLIC PANEL
ALT: 10 U/L (ref 0–44)
AST: 16 U/L (ref 15–41)
Albumin: 3.3 g/dL — ABNORMAL LOW (ref 3.5–5.0)
Alkaline Phosphatase: 27 U/L — ABNORMAL LOW (ref 38–126)
Anion gap: 8 (ref 5–15)
BUN: 14 mg/dL (ref 8–23)
CO2: 24 mmol/L (ref 22–32)
Calcium: 8.9 mg/dL (ref 8.9–10.3)
Chloride: 107 mmol/L (ref 98–111)
Creatinine, Ser: 0.81 mg/dL (ref 0.44–1.00)
GFR, Estimated: >60 mL/min (ref >60)
Glucose, Bld: 118 mg/dL — ABNORMAL HIGH (ref 70–99)
Potassium: 4.4 mmol/L (ref 3.5–5.1)
Sodium: 139 mmol/L (ref 135–145)
Total Bilirubin: 0.4 mg/dL (ref 0.3–1.2)
Total Protein: 5.8 g/dL — ABNORMAL LOW (ref 6.5–8.1)

## 2020-02-22 LAB — CBC
HCT: 30.3 % — ABNORMAL LOW (ref 36.0–46.0)
Hemoglobin: 10 g/dL — ABNORMAL LOW (ref 12.0–15.0)
MCH: 29.9 pg (ref 26.0–34.0)
MCHC: 33 g/dL (ref 30.0–36.0)
MCV: 90.7 fL (ref 80.0–100.0)
Platelets: 142 10*3/uL — ABNORMAL LOW (ref 150–400)
RBC: 3.34 MIL/uL — ABNORMAL LOW (ref 3.87–5.11)
RDW: 15.5 % (ref 11.5–15.5)
WBC: 10.8 10*3/uL — ABNORMAL HIGH (ref 4.0–10.5)
nRBC: 0 % (ref 0.0–0.2)

## 2020-02-22 MED ORDER — SODIUM CHLORIDE 0.9 % IV SOLN
1.0000 g | INTRAVENOUS | Status: AC
  Administered 2020-02-22 – 2020-02-23 (×2): 1 g via INTRAVENOUS
  Filled 2020-02-22 (×2): qty 1

## 2020-02-22 MED ORDER — HYDROCODONE-ACETAMINOPHEN 5-325 MG PO TABS
1.0000 | ORAL_TABLET | ORAL | 0 refills | Status: DC | PRN
Start: 2020-02-22 — End: 2020-03-10

## 2020-02-22 MED ORDER — ASPIRIN 81 MG PO CHEW: 81.0000 mg | CHEWABLE_TABLET | Freq: Two times a day (BID) | ORAL | 0 refills | Status: AC

## 2020-02-22 NOTE — Evaluation (Signed)
Physical Therapy Evaluation Patient Details Name: [name] [name] MRN: [id number] DOB: [birth year] Today's Date: 02/22/2020   History of Present Illness  86 y.o. female with PMH of dementia, CAD, HTN with c/o R hip pain after fall at home, s/p R hip hemiarthroplasty AA 02/21/20.  Clinical Impression  Pt admitted with above diagnosis. PTA, pt's son reports pt was ambulating around the home without AD and no assistance, family supervised ADLs. Eval limited due to lethargy, pt able to open eyes to name and gentle sternal rub, but closes eyes after 5-10 seconds. Pt able to sit EOB for ~3 minutes with varying assistance max A to min G depending on alertness. Assisted pt back to supine and answered all pt's sons questions. Pt on RA upon arrival with SpO2 87% so placed 2L O2 via nasal cannula with SpO2 94%- notified RN. Family accepting of possible SNF placement due to weakness and assistance required with mobility, but hopeful for progress with acute therapies. Pt currently with functional limitations due to the deficits listed below (see PT Problem List). Pt will benefit from skilled PT to increase their independence and safety with mobility to allow discharge to the venue listed below.       Follow Up Recommendations Supervision/Assistance - 24 hour;SNF    Equipment Recommendations  Other (comment) (TBD)    Recommendations for Other Services       Precautions / Restrictions Precautions Precautions: Fall Precaution Comments: vision in L eye only Restrictions Weight Bearing Restrictions: No RLE Weight Bearing: Weight bearing as tolerated      Mobility  Bed Mobility Overal bed mobility: Needs Assistance Bed Mobility: Supine to Sit;Sit to Supine  Supine to sit: Total assist;+2 for physical assistance Sit to supine: Total assist;+2 for physical assistance   General bed mobility comments: total assist for BLE and trunk for supine<>sit transfer, pt sits EOB for ~3 minutes with varying assist  max to min G due to lethargic and difficulty maintaining eyes opened, no facial wincing or resistance noted with mobility    Transfers  General transfer comment: not attempted  Ambulation/Gait  General Gait Details: not attempted  Stairs            Wheelchair Mobility    Modified Rankin (Stroke Patients Only)       Balance Overall balance assessment: Needs assistance Sitting-balance support: Feet supported Sitting balance-Leahy Scale: Zero Sitting balance - Comments: max A to min G with sitting EOB for ~3 minutes        Pertinent Vitals/Pain Pain Assessment: Faces Faces Pain Scale: No hurt    Home Living Family/patient expects to be discharged to:: Private residence Living Arrangements: Children (sister) Available Help at Discharge: Family;Available 24 hours/day Type of Home: House Home Access: Stairs to enter   [employer] of Steps: 1 front back 2 Home Layout: One level Home Equipment: Shower seat      Prior Function Level of Independence: Needs assistance   Gait / Transfers Assistance Needed: Pt's son reports pt ambulates around home without AD and no physical assistance  ADL's / Homemaking Assistance Needed: Pt's son reports supervision for showers, set up for dressing  Comments: Pt's son reports pt is pleasantly confused, has her own routine but requires cues to complete tasks occasionally     Hand Dominance   Dominant Hand: Right    Extremity/Trunk Assessment   Upper Extremity Assessment Upper Extremity Assessment: Defer to OT evaluation    Lower Extremity Assessment Lower Extremity Assessment: Difficult to assess due to  impaired cognition (hip, knee, ankle PROM WNL)    Cervical / Trunk Assessment Cervical / Trunk Assessment: Kyphotic  Communication   Communication: No difficulties  Cognition Arousal/Alertness: Lethargic;Suspect due to medications Behavior During Therapy: Flat affect Overall Cognitive Status: History of  cognitive impairments - at baseline  General Comments: Pt's son in room, reports pt gets drowsy after medications occasionally like this. Pt opens eyes to name, but returns to sleeping state within 5-10 seconds due to difficulty staying awake.      General Comments      Exercises     Assessment/Plan    PT Assessment Patient needs continued PT services  PT Problem List Decreased strength;Decreased range of motion;Decreased activity tolerance;Decreased balance;Decreased mobility;Decreased cognition;Decreased knowledge of use of DME;Decreased safety awareness;Obesity       PT Treatment Interventions DME instruction;Gait training;Functional mobility training;Therapeutic activities;Therapeutic exercise;Balance training;Neuromuscular re-education;Cognitive remediation;Patient/family education    PT Goals (Current goals can be found in the Care Plan section)  Acute Rehab PT Goals Patient Stated Goal: "home if 80% of baseline" PT Goal Formulation: With family Time For Goal Achievement: 03/07/20 Potential to Achieve Goals: Fair    Frequency Min 2X/week   Barriers to discharge        Co-evaluation PT/OT/SLP Co-Evaluation/Treatment: Yes Reason for Co-Treatment: Necessary to address cognition/behavior during functional activity;To address functional/ADL transfers PT goals addressed during session: Mobility/safety with mobility         AM-PAC PT "6 Clicks" Mobility  Outcome Measure Help needed turning from your back to your side while in a flat bed without using bedrails?: Total Help needed moving from lying on your back to sitting on the side of a flat bed without using bedrails?: Total Help needed moving to and from a bed to a chair (including a wheelchair)?: Total Help needed standing up from a chair using your arms (e.g., wheelchair or bedside chair)?: Total Help needed to walk in hospital room?: Total Help needed climbing 3-5 steps with a railing? : Total 6 Click Score: 6     End of Session Equipment Utilized During Treatment: Oxygen Activity Tolerance: Patient limited by lethargy Patient left: in bed;with call bell/phone within reach;with family/visitor present Nurse Communication: Mobility status;Weight bearing status (O2) PT Visit Diagnosis: Other abnormalities of gait and mobility (R26.89);Muscle weakness (generalized) (M62.81)    Time: 0175-1025 PT Time Calculation (min) (ACUTE ONLY): 28 min   Charges:   PT Evaluation $PT Eval Moderate Complexity: 1 Mod           Tori Nicolle Heward PT, DPT 02/22/20, 1:13 PM

## 2020-02-22 NOTE — Progress Notes (Signed)
    Subjective:  Patient reports pain as mild.  Denies N/V/CP/SOB. Patient is pleasantly confused this morning and has no complaints when asked.   Objective:   VITALS:   Vitals:   02/21/20 1727 02/21/20 2129 02/22/20 0138 02/22/20 0610  BP: 121/66 118/67 105/60 (!) 125/49  Pulse: 61 (!) 57 (!) 57 71  Resp: 16 17 16 16   Temp: 98.2 F (36.8 C) 98 F (36.7 C) 98 F (36.7 C) 98 F (36.7 C)  TempSrc:  Axillary Axillary Axillary  SpO2: 97% 97% 99% 92%  Weight:      Height:        NAD ABD soft Neurovascular intact Sensation intact distally Intact pulses distally Dorsiflexion/Plantar flexion intact Incision: dressing C/D/I   Lab Results  Component Value Date   WBC 10.8 (H) 02/22/2020   HGB 10.0 (L) 02/22/2020   HCT 30.3 (L) 02/22/2020   MCV 90.7 02/22/2020   PLT 142 (L) 02/22/2020   BMET    Component Value Date/Time   NA 139 02/22/2020 0316   NA 142 05/14/2019 0000   K 4.4 02/22/2020 0316   CL 107 02/22/2020 0316   CO2 24 02/22/2020 0316   GLUCOSE 118 (H) 02/22/2020 0316   BUN 14 02/22/2020 0316   BUN 11 05/14/2019 0000   CREATININE 0.81 02/22/2020 0316   CREATININE 0.94 (H) 02/15/2020 1136   CALCIUM 8.9 02/22/2020 0316   GFRNONAA >60 02/22/2020 0316   GFRNONAA 55 (L) 02/15/2020 1136   GFRAA 64 02/15/2020 1136     Assessment/Plan: 1 Day Post-Op   Active Problems:   Hip fracture (HCC)   WBAT with walker DVT ppx: Aspirin, SCDs, TEDS PO pain control PT/OT Dispo: D/C once PT cleared    02/17/2020 02/22/2020, 7:09 AM  [facility] is now [facility] [address], [place], [place] Phone: [phone] [email or web address] Facebook  [phone]

## 2020-02-22 NOTE — Progress Notes (Signed)
PROGRESS NOTE    [name] [name]  WUJ:[id number] DOB: [birth year] DOA: 02/21/2020 PCP: [name], NP    Brief Narrative:  86 y.o. female with medical history significant of dementia. Hx per daughter at bedside. She reports that the patient has become more confused over the past week or so. She is normally has intermittent periods of lucidness, but that has not happened over the past week. She has had more hallucinations (seeing people that aren't there) and has been more agitated. This morning she woke up and went to the living room around 2:20AM. Her daughter heard a crash and her mother call out for help. She found her mother had fallen on her right side and could not get up. She called for her brother's assistance to get her up. They called for EMS to transport to the ED.   ED Course: The patient was found to have a hip fracture. Orthopedics was called. [facility] was called for admission  Assessment & Plan:   Active Problems:   Hip fracture (HCC)  Fall Subcapital fracture of the right femoral neck      - Orthopedic Surgery consulted.  -Pt now s/p surgery 10/21     - PT/OT consulted. Recommendations for 24hr supervision/SNF  Hx of dementia with report of increasing confusion     - continue namenda, buspar, xanax -Avoid escalating sedating meds if possible -Will complete tx for UTI below  Hypothyroidism     - continue levothyroxine as tolerated -TSH of 0.51  HTN     - continue norvasc -BP currently stable and controlled  HLD     - continue zetia as tolerated  Likely UTI     - UA is concerning for UTI in the setting of reported increased confusion leading up to admission     - Urine cx pending     - Will complete empiric 3 day course of abx, currently on rocephin  DVT prophylaxis: Lovenox subq Code Status: Full Family Communication: Pt in room, family not at bedside  Status is: Inpatient  Remains inpatient appropriate because:Unsafe d/c plan and Inpatient  level of care appropriate due to severity of illness   Dispo: The patient is from: Home              Anticipated d/c is to: SNF              Anticipated d/c date is: 3 days              Patient currently is not medically stable to d/c.       Consultants:   Orthopedic Surgery  Procedures:   R hip hemiarthroplasty 10/21  Antimicrobials: Anti-infectives (From admission, onward)   Start     Dose/Rate Route Frequency Ordered Stop   02/21/20 1800  ceFAZolin (ANCEF) IVPB 2g/100 mL premix        2 g 200 mL/hr over 30 Minutes Intravenous Every 6 hours 02/21/20 1539 02/22/20 0000   02/21/20 1600  cefTRIAXone (ROCEPHIN) 1 g in sodium chloride 0.9 % 100 mL IVPB  Status:  Discontinued        1 g 200 mL/hr over 30 Minutes Intravenous Every 24 hours 02/21/20 1050 02/22/20 1504   02/21/20 1004  ceFAZolin (ANCEF) 2-4 GM/100ML-% IVPB       Note to Pharmacy: Linard Millers   : cabinet override      02/21/20 1004 02/21/20 1633       Subjective: Nonverbal this AM, appears comfortable  Objective: Vitals:  02/22/20 0138 02/22/20 0610 02/22/20 1037 02/22/20 1417  BP: 105/60 (!) 125/49 116/64 119/61  Pulse: (!) 57 71 69 68  Resp: 16 16 19 16   Temp: 98 F (36.7 C) 98 F (36.7 C) 98.3 F (36.8 C) 99.3 F (37.4 C)  TempSrc: Axillary Axillary Axillary Axillary  SpO2: 99% 92% 93% 94%  Weight:      Height:        Intake/Output Summary (Last 24 hours) at 02/22/2020 1618 Last data filed at 02/22/2020 1445 Gross per 24 hour  Intake 740 ml  Output 1775 ml  Net -1035 ml   Filed Weights   02/21/20 1009  Weight: 66.7 kg    Examination:  General exam: Appears calm and comfortable  Respiratory system: Clear to auscultation. Respiratory effort normal. Cardiovascular system: S1 & S2 heard, Regular Gastrointestinal system: Abdomen is nondistended, soft and nontender. No organomegaly or masses felt. Normal bowel sounds heard. Central nervous system: Alert No focal neurological  deficits. Extremities: Symmetric 5 x 5 power. Skin: No rashes, lesions Psychiatry: Unable to assess given current mentation   Data Reviewed: I have personally reviewed following labs and imaging studies  CBC: Recent Labs  Lab 02/21/20 0404 02/22/20 0316  WBC 8.2 10.8*  NEUTROABS 6.1  --   HGB 12.9 10.0*  HCT 39.5 30.3*  MCV 91.0 90.7  PLT 196 142*   Basic Metabolic Panel: Recent Labs  Lab 02/21/20 0404 02/22/20 0316  NA 139 139  K 4.2 4.4  CL 104 107  CO2 27 24  GLUCOSE 97 118*  BUN 14 14  CREATININE 1.16* 0.81  CALCIUM 10.1 8.9   GFR: Estimated Creatinine Clearance: 42.2 mL/min (by C-G formula based on SCr of 0.81 mg/dL). Liver Function Tests: Recent Labs  Lab 02/22/20 0316  AST 16  ALT 10  ALKPHOS 27*  BILITOT 0.4  PROT 5.8*  ALBUMIN 3.3*   No results for input(s): LIPASE, AMYLASE in the last 168 hours. No results for input(s): AMMONIA in the last 168 hours. Coagulation Profile: No results for input(s): INR, PROTIME in the last 168 hours. Cardiac Enzymes: No results for input(s): CKTOTAL, CKMB, CKMBINDEX, TROPONINI in the last 168 hours. BNP (last 3 results) No results for input(s): PROBNP in the last 8760 hours. HbA1C: No results for input(s): HGBA1C in the last 72 hours. CBG: No results for input(s): GLUCAP in the last 168 hours. Lipid Profile: No results for input(s): CHOL, HDL, LDLCALC, TRIG, CHOLHDL, LDLDIRECT in the last 72 hours. Thyroid Function Tests: No results for input(s): TSH, T4TOTAL, FREET4, T3FREE, THYROIDAB in the last 72 hours. Anemia Panel: No results for input(s): VITAMINB12, FOLATE, FERRITIN, TIBC, IRON, RETICCTPCT in the last 72 hours. Sepsis Labs: No results for input(s): PROCALCITON, LATICACIDVEN in the last 168 hours.  Recent Results (from the past 240 hour(s))  Respiratory Panel by RT PCR (Flu A&B, Covid) - Nasopharyngeal Swab     Status: None   Collection Time: 02/21/20  4:53 AM   Specimen: Nasopharyngeal Swab  Result  Value Ref Range Status   SARS Coronavirus 2 by RT PCR NEGATIVE NEGATIVE Final    Comment: (NOTE) SARS-CoV-2 target nucleic acids are NOT DETECTED.  The SARS-CoV-2 RNA is generally detectable in upper respiratoy specimens during the acute phase of infection. The lowest concentration of SARS-CoV-2 viral copies this assay can detect is 131 copies/mL. A negative result does not preclude SARS-Cov-2 infection and should not be used as the sole basis for treatment or other patient management decisions. A negative  result may occur with  improper specimen collection/handling, submission of specimen other than nasopharyngeal swab, presence of viral mutation(s) within the areas targeted by this assay, and inadequate number of viral copies (<131 copies/mL). A negative result must be combined with clinical observations, patient history, and epidemiological information. The expected result is Negative.  Fact Sheet for Patients:  https://www.moore.com/  Fact Sheet for Healthcare Providers:  https://www.young.biz/  This test is no t yet approved or cleared by the [place] FDA and  has been authorized for detection and/or diagnosis of SARS-CoV-2 by FDA under an Emergency Use Authorization (EUA). This EUA will remain  in effect (meaning this test can be used) for the duration of the COVID-19 declaration under Section 564(b)(1) of the Act, 21 U.S.C. section 360bbb-3(b)(1), unless the authorization is terminated or revoked sooner.     Influenza A by PCR NEGATIVE NEGATIVE Final   Influenza B by PCR NEGATIVE NEGATIVE Final    Comment: (NOTE) The Xpert Xpress SARS-CoV-2/FLU/RSV assay is intended as an aid in  the diagnosis of influenza from Nasopharyngeal swab specimens and  should not be used as a sole basis for treatment. Nasal washings and  aspirates are unacceptable for Xpert Xpress SARS-CoV-2/FLU/RSV  testing.  Fact Sheet for  Patients: https://www.moore.com/  Fact Sheet for Healthcare Providers: https://www.young.biz/  This test is not yet approved or cleared by the [place] FDA and  has been authorized for detection and/or diagnosis of SARS-CoV-2 by  FDA under an Emergency Use Authorization (EUA). This EUA will remain  in effect (meaning this test can be used) for the duration of the  Covid-19 declaration under Section 564(b)(1) of the Act, 21  U.S.C. section 360bbb-3(b)(1), unless the authorization is  terminated or revoked. Performed at [facility], [address] [place], [place] [address]      Radiology Studies: DG Chest 1 View  Result Date: 02/21/2020 CLINICAL DATA:  Preoperative chest.  Fall. EXAM: CHEST  1 VIEW COMPARISON:  01/13/2018 FINDINGS: Postoperative changes in the mediastinum. Shallow inspiration. Diffuse cardiac enlargement. Mild central vascular congestion. No edema or consolidation. No pleural effusions. No pneumothorax. Tortuous aorta. IMPRESSION: Cardiac enlargement with mild central vascular congestion. Electronically Signed   By: Burman Nieves M.D.   On: 02/21/2020 04:24   CT Head Wo Contrast  Result Date: 02/21/2020 CLINICAL DATA:  Mental status change with unknown cause. Fall at home EXAM: CT HEAD WITHOUT CONTRAST CT CERVICAL SPINE WITHOUT CONTRAST TECHNIQUE: Multidetector CT imaging of the head and cervical spine was performed following the standard protocol without intravenous contrast. Multiplanar CT image reconstructions of the cervical spine were also generated. COMPARISON:  None. FINDINGS: CT HEAD FINDINGS Brain: No evidence of acute infarction, hemorrhage, hydrocephalus, extra-axial collection or mass lesion/mass effect. Atrophy that is most notable in the bilateral temporal lobes, correlating with dementia history. Moderate for age chronic small vessel ischemia in the white matter. Vascular: No hyperdense vessel  or unexpected calcification. Skull: Negative for fracture Sinuses/Orbits: No evidence of injury. Bilateral cataract resection. Partial bilateral mastoid opacification with negative nasopharynx. CT CERVICAL SPINE FINDINGS Alignment: No traumatic malalignment Skull base and vertebrae: No acute fracture or incidental bone lesion. Incidental T3-4 non segmentation. Soft tissues and spinal canal: No prevertebral fluid or swelling. No visible canal hematoma. Disc levels: Ordinary degenerative changes. No visible cord impingement Upper chest: Negative IMPRESSION: 1. No evidence of intracranial or cervical spine injury. 2. Temporal predominant brain atrophy in keeping with history of dementia. Electronically Signed   By: Marja Kays  Watts M.D.   On: 02/21/2020 04:37   CT Cervical Spine Wo Contrast  Result Date: 02/21/2020 CLINICAL DATA:  Mental status change with unknown cause. Fall at home EXAM: CT HEAD WITHOUT CONTRAST CT CERVICAL SPINE WITHOUT CONTRAST TECHNIQUE: Multidetector CT imaging of the head and cervical spine was performed following the standard protocol without intravenous contrast. Multiplanar CT image reconstructions of the cervical spine were also generated. COMPARISON:  None. FINDINGS: CT HEAD FINDINGS Brain: No evidence of acute infarction, hemorrhage, hydrocephalus, extra-axial collection or mass lesion/mass effect. Atrophy that is most notable in the bilateral temporal lobes, correlating with dementia history. Moderate for age chronic small vessel ischemia in the white matter. Vascular: No hyperdense vessel or unexpected calcification. Skull: Negative for fracture Sinuses/Orbits: No evidence of injury. Bilateral cataract resection. Partial bilateral mastoid opacification with negative nasopharynx. CT CERVICAL SPINE FINDINGS Alignment: No traumatic malalignment Skull base and vertebrae: No acute fracture or incidental bone lesion. Incidental T3-4 non segmentation. Soft tissues and spinal canal: No  prevertebral fluid or swelling. No visible canal hematoma. Disc levels: Ordinary degenerative changes. No visible cord impingement Upper chest: Negative IMPRESSION: 1. No evidence of intracranial or cervical spine injury. 2. Temporal predominant brain atrophy in keeping with history of dementia. Electronically Signed   By: Marnee Spring M.D.   On: 02/21/2020 04:37   Pelvis Portable  Result Date: 02/21/2020 CLINICAL DATA:  Status post right hip replacement. EXAM: PORTABLE PELVIS 1-2 VIEWS COMPARISON:  Right hip radiographs 02/21/2020 FINDINGS: Sequelae of interval right hip hemiarthroplasty are identified. The femoral prosthesis is approximated with the acetabulum on this single image. No acute fracture is identified. Postoperative gas is noted in the surrounding soft tissues. Coarse calcification in the pelvis is suggestive of uterine fibroid. IMPRESSION: Interval right hip hemiarthroplasty. Electronically Signed   By: Sebastian Ache M.D.   On: 02/21/2020 14:28   DG C-Arm 1-60 Min-No Report  Result Date: 02/21/2020 Fluoroscopy was utilized by the requesting physician.  No radiographic interpretation.   DG HIP OPERATIVE UNILAT WITH PELVIS RIGHT  Result Date: 02/21/2020 CLINICAL DATA:  Right hip surgery. EXAM: OPERATIVE RIGHT HIP (WITH PELVIS IF PERFORMED) TECHNIQUE: Fluoroscopic spot image(s) were submitted for interpretation post-operatively. COMPARISON:  Right hip radiographs 02/21/2020 FINDINGS: Five intraoperative spot fluoroscopic images are provided during right hip hemiarthroplasty. The femoral prosthesis is approximated with the acetabulum on these limited images. IMPRESSION: Intraoperative images during right hip hemiarthroplasty. Electronically Signed   By: Sebastian Ache M.D.   On: 02/21/2020 14:27   DG Hip Unilat W or Wo Pelvis 2-3 Views Right  Result Date: 02/21/2020 CLINICAL DATA:  Right hip pain after a fall today EXAM: DG HIP (WITH OR WITHOUT PELVIS) 2-3V RIGHT COMPARISON:  None.  FINDINGS: Transverse subcapital fracture of the right femoral neck with varus angulation. No dislocation at the hip joint. Pelvis appears intact. SI joints and symphysis pubis are not displaced. Calcification in the pelvis likely represents a calcified uterine fibroid. IMPRESSION: Transverse subcapital fracture of the right femoral neck with varus angulation. Electronically Signed   By: Burman Nieves M.D.   On: 02/21/2020 04:23    Scheduled Meds: . acetaminophen  500 mg Oral Q6H  . amLODipine  10 mg Oral Daily  . aspirin  81 mg Oral Daily  . busPIRone  15 mg Oral BID  . Chlorhexidine Gluconate Cloth  6 each Topical Daily  . docusate sodium  100 mg Oral BID  . enoxaparin (LOVENOX) injection  40 mg Subcutaneous Q24H  . ezetimibe  10 mg Oral Daily  . latanoprost  1 drop Both Eyes QHS  . levothyroxine  100 mcg Oral QAC breakfast  . memantine  10 mg Oral BID  . traZODone  100 mg Oral QHS   Continuous Infusions: . methocarbamol (ROBAXIN) IV       LOS: 1 day   Rickey BarbaraStephen Adams Hinch, MD [facility] Pager On Amion  If 7PM-7AM, please contact night-coverage 02/22/2020, 4:18 PM

## 2020-02-22 NOTE — Progress Notes (Signed)
Occupational Therapy Evaluation  Patient with functional deficits listed below impacting safety and independence with self care. Per son patient has 24/7 supervision at home between himself and DTR that patient lives with. Patient's mentation fluctuates per son, sometimes she will initiate self cares other times they have to encourage her for bathing, dressing. Patient typically ambulates without AD. Currently patient is groggy, son reports recently received medication that helps with her anxiety. Patient require total A for bed mobility due to no initiation from patient, with no improvement in arousal seated EOB requiring from min G up to max A to maintain upright position. Recommend continued acute OT services to facilitate D/C to venue listed below.    02/22/20 1312  OT Visit Information  Last OT Received On 02/22/20  Assistance Needed +2  PT/OT/SLP Co-Evaluation/Treatment Yes  Reason for Co-Treatment To address functional/ADL transfers;For patient/therapist safety;Necessary to address cognition/behavior during functional activity  OT goals addressed during session ADL's and self-care  History of Present Illness 86 y.o. female with PMH of dementia, CAD, HTN with c/o R hip pain after fall at home, s/p R hip hemiarthroplasty AA 02/21/20.  Precautions  Precautions Fall  Precaution Comments vision in L eye only  Restrictions  Weight Bearing Restrictions Yes  RLE Weight Bearing WBAT  Home Living  Family/patient expects to be discharged to: Private residence  Living Arrangements Children (daughter)  Available Help at Discharge Family;Available 24 hours/day  Type of Home House  Home Access Stairs to enter  Entrance Stairs-Number of Steps 1 front back 2  Home Layout One level  Bathroom Facilities manager  Prior Function  Level of Independence Needs assistance  Gait / Transfers Assistance Needed Pt's son reports pt ambulates around home without AD and no  physical assistance  ADL's / Homemaking Assistance Needed Pt's son reports supervision for showers, set up for dressing  Comments Pt's son reports pt is pleasantly confused, has her own routine but requires cues to complete tasks occasionally  Communication  Communication No difficulties  Pain Assessment  Pain Assessment Faces  Faces Pain Scale 0  Cognition  Arousal/Alertness Lethargic;Suspect due to medications  Behavior During Therapy Flat affect  Overall Cognitive Status History of cognitive impairments - at baseline  General Comments Pt's son in room, reports pt gets drowsy after medications occasionally like this. Pt opens eyes to name, but returns to sleeping state within 5-10 seconds due to difficulty staying awake.  Upper Extremity Assessment  Upper Extremity Assessment Generalized weakness  Lower Extremity Assessment  Lower Extremity Assessment Defer to PT evaluation  Cervical / Trunk Assessment  Cervical / Trunk Assessment Kyphotic  ADL  Overall ADL's  Needs assistance/impaired  Grooming Maximal assistance;Sitting  Upper Body Bathing Maximal assistance;Sitting  Lower Body Bathing Total assistance;Sitting/lateral leans  Upper Body Dressing  Maximal assistance;Sitting  Lower Body Dressing Total assistance;Sitting/lateral leans  Toilet Transfer Details (indicate cue type and reason) deferred due to grogginess  Toileting- Clothing Manipulation and Hygiene Total assistance  Functional mobility during ADLs Maximal assistance  General ADL Comments patient groggy, son reports recently received medication for anxiety which can make her drowsy, likely able to do more herself once more alert  Vision- History  Patient Visual Report Other (comment) (son reports patient has vision in L eye only)  Bed Mobility  Overal bed mobility Needs Assistance  Bed Mobility Supine to Sit;Sit to Supine  Supine to sit Total assist;+2 for physical assistance  Sit to supine Total assist;+2 for  physical assistance  General bed mobility comments total assist for BLE and trunk for supine<>sit transfer, pt sits EOB for ~3 minutes with varying assist max to min G due to lethargic and difficulty maintaining eyes opened, no facial wincing or resistance noted with mobility  Transfers  General transfer comment deferred due to groggy  Balance  Overall balance assessment Needs assistance  Sitting-balance support Feet supported  Sitting balance-Leahy Scale Zero  Sitting balance - Comments max A to min G with sitting EOB for ~3 minutes  General Comments  General comments (skin integrity, edema, etc.) patient desat when sleeping to 87-88% therefore O2 donned at end of session, during seated EOB patient maintaining in mid 90s on RA  OT - End of Session  Equipment Utilized During Treatment Oxygen  Activity Tolerance Patient limited by lethargy  Patient left in bed;with call bell/phone within reach;with chair alarm set;with family/visitor present  Nurse Communication Mobility status  OT Assessment  OT Recommendation/Assessment Patient needs continued OT Services  OT Visit Diagnosis Other abnormalities of gait and mobility (R26.89);Muscle weakness (generalized) (M62.81);History of falling (Z91.81)  OT Problem List Decreased strength;Decreased activity tolerance;Impaired balance (sitting and/or standing);Decreased safety awareness  OT Plan  OT Frequency (ACUTE ONLY) Min 2X/week  OT Treatment/Interventions (ACUTE ONLY) Self-care/ADL training;Therapeutic activities;Patient/family education;Balance training  AM-PAC OT "6 Clicks" Daily Activity Outcome Measure (Version 2)  Help from another person eating meals? 2  Help from another person taking care of personal grooming? 2  Help from another person toileting, which includes using toliet, bedpan, or urinal? 1  Help from another person bathing (including washing, rinsing, drying)? 2  Help from another person to put on and taking off regular upper body  clothing? 2  Help from another person to put on and taking off regular lower body clothing? 1  6 Click Score 10  OT Recommendation  Follow Up Recommendations SNF  OT Equipment Other (comment) (defer to next venue)  Individuals Consulted  Consulted and Agree with Results and Recommendations Family member/caregiver  Family Member Consulted son  Acute Rehab OT Goals  Patient Stated Goal "home if 80% of baseline"  OT Goal Formulation With family  Time For Goal Achievement 03/07/20  Potential to Achieve Goals Good  OT Time Calculation  OT Start Time (ACUTE ONLY) 0915  OT Stop Time (ACUTE ONLY) 0943  OT Time Calculation (min) 28 min  OT General Charges  $OT Visit 1 Visit  OT Evaluation  $OT Eval Moderate Complexity 1 Mod  Written Expression  Dominant Hand Right   Marlyce Huge OT OT pager: [phone]

## 2020-02-23 DIAGNOSIS — Z419 Encounter for procedure for purposes other than remedying health state, unspecified: Secondary | ICD-10-CM

## 2020-02-23 LAB — URINE CULTURE

## 2020-02-23 LAB — BASIC METABOLIC PANEL
Anion gap: 11 (ref 5–15)
BUN: 15 mg/dL (ref 8–23)
CO2: 24 mmol/L (ref 22–32)
Calcium: 9.3 mg/dL (ref 8.9–10.3)
Chloride: 105 mmol/L (ref 98–111)
Creatinine, Ser: 0.77 mg/dL (ref 0.44–1.00)
GFR, Estimated: >60 mL/min (ref >60)
Glucose, Bld: 93 mg/dL (ref 70–99)
Potassium: 3.6 mmol/L (ref 3.5–5.1)
Sodium: 140 mmol/L (ref 135–145)

## 2020-02-23 LAB — CBC
HCT: 33.2 % — ABNORMAL LOW (ref 36.0–46.0)
Hemoglobin: 10.9 g/dL — ABNORMAL LOW (ref 12.0–15.0)
MCH: 29.9 pg (ref 26.0–34.0)
MCHC: 32.8 g/dL (ref 30.0–36.0)
MCV: 91 fL (ref 80.0–100.0)
Platelets: 162 10*3/uL (ref 150–400)
RBC: 3.65 MIL/uL — ABNORMAL LOW (ref 3.87–5.11)
RDW: 15.4 % (ref 11.5–15.5)
WBC: 9.9 10*3/uL (ref 4.0–10.5)
nRBC: 0 % (ref 0.0–0.2)

## 2020-02-23 LAB — GLUCOSE, CAPILLARY: Glucose-Capillary: 104 mg/dL — ABNORMAL HIGH (ref 70–99)

## 2020-02-23 NOTE — Progress Notes (Signed)
Subjective: 2 Days Post-Op Procedure(s) (LRB): HEMIARTHROPLASTY ANTERIOR APPROACH (Right) Patient reports pain as mild.   Had some agitation over night and required mittens. Resting peacefully this am with Son at bedside. +Void. Unknown flatus. Tolerating PO w/o N/V +ambulation with PT No apparent CP, SOB  Objective: Vital signs in last 24 hours: Temp:  [98.2 F (36.8 C)-99.3 F (37.4 C)] 98.2 F (36.8 C) (10/22 2131) Pulse Rate:  [68-69] 68 (10/22 2131) Resp:  [16-19] 18 (10/22 2131) BP: (116-127)/(61-65) 127/65 (10/22 2131) SpO2:  [93 %-94 %] 94 % (10/22 2131)  Intake/Output from previous day: 10/22 0701 - 10/23 0700 In: 1140 [P.O.:1040; IV Piggyback:100] Out: 2375 [Urine:2375] Intake/Output this shift: Total I/O In: -  Out: 650 [Urine:650]  Recent Labs    02/21/20 0404 02/22/20 0316 02/23/20 0311  HGB 12.9 10.0* 10.9*   Recent Labs    02/22/20 0316 02/23/20 0311  WBC 10.8* 9.9  RBC 3.34* 3.65*  HCT 30.3* 33.2*  PLT 142* 162   Recent Labs    02/22/20 0316 02/23/20 0311  NA 139 140  K 4.4 3.6  CL 107 105  CO2 24 24  BUN 14 15  CREATININE 0.81 0.77  GLUCOSE 118* 93  CALCIUM 8.9 9.3   No results for input(s): LABPT, INR in the last 72 hours.  NAD ABD soft Neurovascular intact Sensation intact distally Intact pulses distally Dorsiflexion/Plantar flexion intact Incision: dressing C/D/I NO visual pain to calf palpation. Negative Homan's bilaterally.    Assessment/Plan: 2 Days Post-Op Procedure(s) (LRB): HEMIARTHROPLASTY ANTERIOR APPROACH (Right) WBAT with walker DVT ppx: Subq lovenox, SCDs, TEDS, ambulation Up with PT/OT  Dispo: D/C once PT cleared, cleared  By medicine (tx for UTI) & SNF placement  Leonette Monarch Jayonna Meyering 02/23/2020, 9:15 AM

## 2020-02-23 NOTE — Plan of Care (Signed)
  Problem: Activity: Goal: Risk for activity intolerance will decrease Outcome: Progressing   Problem: Clinical Measurements: Goal: Will remain free from infection Outcome: Progressing   Problem: Nutrition: Goal: Adequate nutrition will be maintained Outcome: Progressing   

## 2020-02-23 NOTE — Progress Notes (Signed)
PROGRESS NOTE    [name] [name]  WER:[id number] DOB: [birth year] DOA: 02/21/2020 PCP: [name], NP    Brief Narrative:  86 y.o. female with medical history significant of dementia. Hx per daughter at bedside. She reports that the patient has become more confused over the past week or so. She is normally has intermittent periods of lucidness, but that has not happened over the past week. She has had more hallucinations (seeing people that aren't there) and has been more agitated. This morning she woke up and went to the living room around 2:20AM. Her daughter heard a crash and her mother call out for help. She found her mother had fallen on her right side and could not get up. She called for her brother's assistance to get her up. They called for EMS to transport to the ED.   ED Course: The patient was found to have a hip fracture. Orthopedics was called. TRH was called for admission  Assessment & Plan:   Active Problems:   Hip fracture [facility])  Fall Subcapital fracture of the right femoral neck      - Orthopedic Surgery following  -Pt now s/p surgery 10/21     - PT/OT consulted. Recommendations for 24hr supervision/SNF  Hx of dementia with report of increasing confusion     - continue namenda, buspar, xanax -Avoid escalating sedating meds if possible -Will treat for UTI per below  Hypothyroidism     - continue levothyroxine as tolerated -TSH of 0.51  HTN     - continue norvasc as tolerated -BP currently stable and controlled  HLD     - continue zetia as tolerated  Likely UTI     - UA is concerning for UTI in the setting of reported increased confusion leading up to admission     - Urine cx is inconclusive      - Will complete empiric 3 day course of abx, currently on rocephin, to complete on 10/24  DVT prophylaxis: Lovenox subq Code Status: Full Family Communication: Pt in room, family is at bedside  Status is: Inpatient  Remains inpatient appropriate  because:Unsafe d/c plan and Inpatient level of care appropriate due to severity of illness   Dispo: The patient is from: Home              Anticipated d/c is to: SNF              Anticipated d/c date is: 2 days              Patient currently is medically stable to d/c. Just awaiting placement by [facility]       Consultants:   Orthopedic Surgery  Procedures:   R hip hemiarthroplasty 10/21  Antimicrobials: Anti-infectives (From admission, onward)   Start     Dose/Rate Route Frequency Ordered Stop   02/22/20 1800  cefTRIAXone (ROCEPHIN) 1 g in sodium chloride 0.9 % 100 mL IVPB        1 g 200 mL/hr over 30 Minutes Intravenous Every 24 hours 02/22/20 1644 02/24/20 1759   02/21/20 1800  ceFAZolin (ANCEF) IVPB 2g/100 mL premix        2 g 200 mL/hr over 30 Minutes Intravenous Every 6 hours 02/21/20 1539 02/22/20 0000   02/21/20 1600  cefTRIAXone (ROCEPHIN) 1 g in sodium chloride 0.9 % 100 mL IVPB  Status:  Discontinued        1 g 200 mL/hr over 30 Minutes Intravenous Every 24 hours 02/21/20 1050 02/22/20 1504  02/21/20 1004  ceFAZolin (ANCEF) 2-4 GM/100ML-% IVPB       Note to Pharmacy: Linard Millers   : cabinet override      02/21/20 1004 02/21/20 1633      Subjective: Remains nonverbal. Family in room assisting with feeding patent   Objective: Vitals:   02/22/20 1417 02/22/20 2131 02/23/20 0926 02/23/20 1320  BP: 119/61 127/65 124/87 110/68  Pulse: 68 68 85 73  Resp: 16 18  14   Temp: 99.3 F (37.4 C) 98.2 F (36.8 C) 98.3 F (36.8 C)   TempSrc: Axillary  Oral   SpO2: 94% 94% 94% 94%  Weight:      Height:        Intake/Output Summary (Last 24 hours) at 02/23/2020 1705 Last data filed at 02/23/2020 1400 Gross per 24 hour  Intake 880 ml  Output 2750 ml  Net -1870 ml   Filed Weights   02/21/20 1009  Weight: 66.7 kg    Examination: General exam: Awake, laying in bed, in nad Respiratory system: Normal respiratory effort, no wheezing Cardiovascular system:  regular rate, s1, s2 Gastrointestinal system: Soft, nondistended, positive BS Central nervous system: CN2-12 grossly intact, strength intact Extremities: Perfused, no clubbing Skin: Normal skin turgor, no notable skin lesions seen Psychiatry: Difficult to assess given mentation  Data Reviewed: I have personally reviewed following labs and imaging studies  CBC: Recent Labs  Lab 02/21/20 0404 02/22/20 0316 02/23/20 0311  WBC 8.2 10.8* 9.9  NEUTROABS 6.1  --   --   HGB 12.9 10.0* 10.9*  HCT 39.5 30.3* 33.2*  MCV 91.0 90.7 91.0  PLT 196 142* 162   Basic Metabolic Panel: Recent Labs  Lab 02/21/20 0404 02/22/20 0316 02/23/20 0311  NA 139 139 140  K 4.2 4.4 3.6  CL 104 107 105  CO2 27 24 24   GLUCOSE 97 118* 93  BUN 14 14 15   CREATININE 1.16* 0.81 0.77  CALCIUM 10.1 8.9 9.3   GFR: Estimated Creatinine Clearance: 42.7 mL/min (by C-G formula based on SCr of 0.77 mg/dL). Liver Function Tests: Recent Labs  Lab 02/22/20 0316  AST 16  ALT 10  ALKPHOS 27*  BILITOT 0.4  PROT 5.8*  ALBUMIN 3.3*   No results for input(s): LIPASE, AMYLASE in the last 168 hours. No results for input(s): AMMONIA in the last 168 hours. Coagulation Profile: No results for input(s): INR, PROTIME in the last 168 hours. Cardiac Enzymes: No results for input(s): CKTOTAL, CKMB, CKMBINDEX, TROPONINI in the last 168 hours. BNP (last 3 results) No results for input(s): PROBNP in the last 8760 hours. HbA1C: No results for input(s): HGBA1C in the last 72 hours. CBG: Recent Labs  Lab 02/23/20 1337  GLUCAP 104*   Lipid Profile: No results for input(s): CHOL, HDL, LDLCALC, TRIG, CHOLHDL, LDLDIRECT in the last 72 hours. Thyroid Function Tests: No results for input(s): TSH, T4TOTAL, FREET4, T3FREE, THYROIDAB in the last 72 hours. Anemia Panel: No results for input(s): VITAMINB12, FOLATE, FERRITIN, TIBC, IRON, RETICCTPCT in the last 72 hours. Sepsis Labs: No results for input(s): PROCALCITON,  LATICACIDVEN in the last 168 hours.  Recent Results (from the past 240 hour(s))  Respiratory Panel by RT PCR (Flu A&B, Covid) - Nasopharyngeal Swab     Status: None   Collection Time: 02/21/20  4:53 AM   Specimen: Nasopharyngeal Swab  Result Value Ref Range Status   SARS Coronavirus 2 by RT PCR NEGATIVE NEGATIVE Final    Comment: (NOTE) SARS-CoV-2 target nucleic acids are NOT  DETECTED.  The SARS-CoV-2 RNA is generally detectable in upper respiratoy specimens during the acute phase of infection. The lowest concentration of SARS-CoV-2 viral copies this assay can detect is 131 copies/mL. A negative result does not preclude SARS-Cov-2 infection and should not be used as the sole basis for treatment or other patient management decisions. A negative result may occur with  improper specimen collection/handling, submission of specimen other than nasopharyngeal swab, presence of viral mutation(s) within the areas targeted by this assay, and inadequate number of viral copies (<131 copies/mL). A negative result must be combined with clinical observations, patient history, and epidemiological information. The expected result is Negative.  Fact Sheet for Patients:  https://www.moore.com/  Fact Sheet for Healthcare Providers:  https://www.young.biz/  This test is no t yet approved or cleared by the [place] FDA and  has been authorized for detection and/or diagnosis of SARS-CoV-2 by FDA under an Emergency Use Authorization (EUA). This EUA will remain  in effect (meaning this test can be used) for the duration of the COVID-19 declaration under Section 564(b)(1) of the Act, 21 U.S.C. section 360bbb-3(b)(1), unless the authorization is terminated or revoked sooner.     Influenza A by PCR NEGATIVE NEGATIVE Final   Influenza B by PCR NEGATIVE NEGATIVE Final    Comment: (NOTE) The Xpert Xpress SARS-CoV-2/FLU/RSV assay is intended as an aid in  the  diagnosis of influenza from Nasopharyngeal swab specimens and  should not be used as a sole basis for treatment. Nasal washings and  aspirates are unacceptable for Xpert Xpress SARS-CoV-2/FLU/RSV  testing.  Fact Sheet for Patients: https://www.moore.com/  Fact Sheet for Healthcare Providers: https://www.young.biz/  This test is not yet approved or cleared by the [place] FDA and  has been authorized for detection and/or diagnosis of SARS-CoV-2 by  FDA under an Emergency Use Authorization (EUA). This EUA will remain  in effect (meaning this test can be used) for the duration of the  Covid-19 declaration under Section 564(b)(1) of the Act, 21  U.S.C. section 360bbb-3(b)(1), unless the authorization is  terminated or revoked. Performed at [facility], [address] [place], [place] [address]   Culture, Urine     Status: Abnormal   Collection Time: 02/21/20  7:06 AM   Specimen: Urine, Catheterized  Result Value Ref Range Status   Specimen Description   Final    URINE, CATHETERIZED Performed at [facility], [address] [place], [place] [address]    Special Requests   Final    NONE Performed at [facility], [address], [place], [place] [address]    Culture MULTIPLE SPECIES PRESENT, SUGGEST RECOLLECTION (A)  Final   Report Status 02/23/2020 FINAL  Final     Radiology Studies: No results found.  Scheduled Meds: . amLODipine  10 mg Oral Daily  . aspirin  81 mg Oral Daily  . busPIRone  15 mg Oral BID  . Chlorhexidine Gluconate Cloth  6 each Topical Daily  . docusate sodium  100 mg Oral BID  . enoxaparin (LOVENOX) injection  40 mg Subcutaneous Q24H  . ezetimibe  10 mg Oral Daily  . latanoprost  1 drop Both Eyes QHS  . levothyroxine  100 mcg Oral QAC breakfast  . memantine  10 mg Oral BID  . traZODone  100 mg Oral QHS   Continuous Infusions: . cefTRIAXone  (ROCEPHIN)  IV Stopped (02/22/20 2059)  . methocarbamol (ROBAXIN) IV       LOS: 2  days   Rickey BarbaraStephen Kamaljit Hizer, MD [facility] Pager On Amion  If 7PM-7AM, please contact night-coverage 02/23/2020, 5:05 PM

## 2020-02-24 LAB — COMPREHENSIVE METABOLIC PANEL
ALT: 10 U/L (ref 0–44)
AST: 18 U/L (ref 15–41)
Albumin: 3.1 g/dL — ABNORMAL LOW (ref 3.5–5.0)
Alkaline Phosphatase: 31 U/L — ABNORMAL LOW (ref 38–126)
Anion gap: 11 (ref 5–15)
BUN: 25 mg/dL — ABNORMAL HIGH (ref 8–23)
CO2: 25 mmol/L (ref 22–32)
Calcium: 9.1 mg/dL (ref 8.9–10.3)
Chloride: 102 mmol/L (ref 98–111)
Creatinine, Ser: 1.12 mg/dL — ABNORMAL HIGH (ref 0.44–1.00)
GFR, Estimated: 48 mL/min — ABNORMAL LOW (ref >60)
Glucose, Bld: 101 mg/dL — ABNORMAL HIGH (ref 70–99)
Potassium: 3.5 mmol/L (ref 3.5–5.1)
Sodium: 138 mmol/L (ref 135–145)
Total Bilirubin: 0.9 mg/dL (ref 0.3–1.2)
Total Protein: 6.1 g/dL — ABNORMAL LOW (ref 6.5–8.1)

## 2020-02-24 LAB — CBC
HCT: 29.7 % — ABNORMAL LOW (ref 36.0–46.0)
Hemoglobin: 9.9 g/dL — ABNORMAL LOW (ref 12.0–15.0)
MCH: 30 pg (ref 26.0–34.0)
MCHC: 33.3 g/dL (ref 30.0–36.0)
MCV: 90 fL (ref 80.0–100.0)
Platelets: 148 10*3/uL — ABNORMAL LOW (ref 150–400)
RBC: 3.3 MIL/uL — ABNORMAL LOW (ref 3.87–5.11)
RDW: 15.5 % (ref 11.5–15.5)
WBC: 7.1 10*3/uL (ref 4.0–10.5)
nRBC: 0 % (ref 0.0–0.2)

## 2020-02-24 LAB — GLUCOSE, CAPILLARY: Glucose-Capillary: 102 mg/dL — ABNORMAL HIGH (ref 70–99)

## 2020-02-24 MED ORDER — LORAZEPAM 2 MG/ML IJ SOLN
0.5000 mg | Freq: Four times a day (QID) | INTRAMUSCULAR | Status: AC | PRN
  Administered 2020-02-24 – 2020-02-26 (×2): 0.5 mg via INTRAVENOUS
  Filled 2020-02-24 (×2): qty 1

## 2020-02-24 MED ORDER — QUETIAPINE FUMARATE 25 MG PO TABS
25.0000 mg | ORAL_TABLET | Freq: Every day | ORAL | Status: DC
  Administered 2020-02-25 – 2020-02-26 (×2): 25 mg via ORAL
  Filled 2020-02-24 (×2): qty 1

## 2020-02-24 MED ORDER — HALOPERIDOL LACTATE 5 MG/ML IJ SOLN
5.0000 mg | Freq: Four times a day (QID) | INTRAMUSCULAR | Status: DC | PRN
  Administered 2020-02-24: 5 mg via INTRAVENOUS
  Filled 2020-02-24: qty 1

## 2020-02-24 MED ORDER — LORAZEPAM 2 MG/ML IJ SOLN
0.5000 mg | Freq: Once | INTRAMUSCULAR | Status: AC
  Administered 2020-02-24: 0.5 mg via INTRAVENOUS
  Filled 2020-02-24: qty 1

## 2020-02-24 MED ORDER — SODIUM CHLORIDE 0.9 % IV SOLN: INTRAVENOUS | Status: AC

## 2020-02-24 MED ORDER — SODIUM CHLORIDE 0.9 % IV BOLUS
500.0000 mL | Freq: Once | INTRAVENOUS | Status: AC
  Administered 2020-02-24: 500 mL via INTRAVENOUS

## 2020-02-24 NOTE — Plan of Care (Signed)
  Problem: Clinical Measurements: Goal: Ability to maintain clinical measurements within normal limits will improve Outcome: Progressing Goal: Will remain free from infection Outcome: Progressing Goal: Diagnostic test results will improve Outcome: Progressing Goal: Respiratory complications will improve Outcome: Progressing Goal: Cardiovascular complication will be avoided Outcome: Progressing   Problem: Activity: Goal: Risk for activity intolerance will decrease Outcome: Progressing   Problem: Nutrition: Goal: Adequate nutrition will be maintained Outcome: Progressing   Problem: Coping: Goal: Level of anxiety will decrease Outcome: Progressing   Problem: Elimination: Goal: Will not experience complications related to bowel motility Outcome: Progressing Goal: Will not experience complications related to urinary retention Outcome: Progressing   Problem: Pain Managment: Goal: General experience of comfort will improve Outcome: Progressing   Problem: Skin Integrity: Goal: Risk for impaired skin integrity will decrease Outcome: Progressing   Problem: Education: Goal: Knowledge of the prescribed therapeutic regimen will improve Outcome: Progressing Goal: Understanding of discharge needs will improve Outcome: Progressing   Problem: Safety: Goal: Ability to remain free from injury will improve Outcome: Progressing   Problem: Activity: Goal: Ability to avoid complications of mobility impairment will improve Outcome: Progressing Goal: Ability to tolerate increased activity will improve Outcome: Progressing   Problem: Clinical Measurements: Goal: Postoperative complications will be avoided or minimized Outcome: Progressing   Problem: Pain Management: Goal: Pain level will decrease with appropriate interventions Outcome: Progressing   Problem: Skin Integrity: Goal: Will show signs of wound healing Outcome: Progressing

## 2020-02-24 NOTE — Progress Notes (Signed)
   02/24/20 1135  Assess: MEWS Score  BP (!) 120/91  Pulse Rate 63  Level of Consciousness Responds to Pain  SpO2 94 %  O2 Device Room Air  Assess: MEWS Score  MEWS Temp 0  MEWS Systolic 0  MEWS Pulse 0  MEWS RR 0  MEWS LOC 2  MEWS Score 2  MEWS Score Color Yellow  Assess: if the MEWS score is Yellow or Red  Were vital signs taken at a resting state? Yes  Focused Assessment No change from prior assessment  Early Detection of Sepsis Score *See Row Information* Low  MEWS guidelines implemented *See Row Information* Yes  Treat  MEWS Interventions Escalated (See documentation below)  Pain Scale 0-10  Pain Score 0  Breathing 0  Negative Vocalization 0  Facial Expression 0  Body Language 0  Consolability 0  PAINAD Score 0  Take Vital Signs  Increase Vital Sign Frequency  Yellow: Q 2hr X 2 then Q 4hr X 2, if remains yellow, continue Q 4hrs  Escalate  MEWS: Escalate Yellow: discuss with charge nurse/RN and consider discussing with provider and RRT  Notify: Charge Nurse/RN  Name of Charge Nurse/RN Notified Dulce Sellar   Date Charge Nurse/RN Notified 02/24/20  Time Charge Nurse/RN Notified 1135  Notify: Provider  Provider Name/Title Chiu   Date Provider Notified 02/24/20  Time Provider Notified 1140  Notification Type Page  Notification Reason Other (Comment) (rapid response called )  Notify: Rapid Response  Name of Rapid Response RN Notified Sarah   Date Rapid Response Notified 02/24/20  Time Rapid Response Notified 1135

## 2020-02-24 NOTE — Progress Notes (Signed)
° °  Subjective: 3 Days Post-Op Procedure(s) (LRB): HEMIARTHROPLASTY ANTERIOR APPROACH (Right) Patient reports pain as mild.   Patient seen in rounds for Dr. Linna Caprice. Patient is well, and has had no acute complaints or problems. She is resting in bed comfortably this morning with son at the bedside. Her son reports she slept well, and has been eating 2 meals per day. Voiding without difficulty, unknown flatus.  We will start therapy today.   Objective: Vital signs in last 24 hours: Temp:  [98.3 F (36.8 C)-98.9 F (37.2 C)] 98.9 F (37.2 C) (10/23 2106) Pulse Rate:  [63-85] 63 (10/23 2106) Resp:  [14-18] 18 (10/23 2106) BP: (110-124)/(63-87) 110/63 (10/23 2106) SpO2:  [93 %-94 %] 93 % (10/23 2106)  Intake/Output from previous day:  Intake/Output Summary (Last 24 hours) at 02/24/2020 0820 Last data filed at 02/24/2020 0600 Gross per 24 hour  Intake 360 ml  Output 200 ml  Net 160 ml     Intake/Output this shift: No intake/output data recorded.  Labs: Recent Labs    02/22/20 0316 02/23/20 0311 02/24/20 0258  HGB 10.0* 10.9* 9.9*   Recent Labs    02/23/20 0311 02/24/20 0258  WBC 9.9 7.1  RBC 3.65* 3.30*  HCT 33.2* 29.7*  PLT 162 148*   Recent Labs    02/23/20 0311 02/24/20 0258  NA 140 138  K 3.6 3.5  CL 105 102  CO2 24 25  BUN 15 25*  CREATININE 0.77 1.12*  GLUCOSE 93 101*  CALCIUM 9.3 9.1   No results for input(s): LABPT, INR in the last 72 hours.  Exam: General - Patient is Alert Extremity - Neurologically intact Sensation intact distally Intact pulses distally Dorsiflexion/Plantar flexion intact Dressing - dressing C/D/I Motor Function - intact, moving foot and toes well on exam.   Past Medical History:  Diagnosis Date   Carotid stenosis    a. Carotid US (10/15):  Bilateral ICA 1-39%   Coronary artery disease    a. NSTEMI >> LHC (02/11/14):  pLAD 95%, mLAD occl, CFX < 20%, pRCA 30%, EF 35%, ant and apical AK >> CABG   Dementia (HCC)     Glucose intolerance (impaired glucose tolerance)    a. A1c 6.1 (01/2014)   H/O non-ST elevation myocardial infarction (NSTEMI)    01/2014   HLD (hyperlipidemia)    Hypertension    Ischemic cardiomyopathy    a. EF 35% by cath at time of NSTEMI >> b. Echo (10/15):  EF 50% to 55%. Wall motion was normal; Grade 1 diastolic dysfunction.  Mild AI.  Ascending aorta mildly dilated. Mild MR.  Mild LAE.  PA peak pressure: 46 mm Hg (S).    Assessment/Plan: 3 Days Post-Op Procedure(s) (LRB): HEMIARTHROPLASTY ANTERIOR APPROACH (Right) Active Problems:   Hip fracture (HCC)  Estimated body mass index is 29.69 kg/m as calculated from the following:   Height as of this encounter: 4\' 11"  (1.499 m).   Weight as of this encounter: 66.7 kg. Advance diet Up with therapy  DVT Prophylaxis - Subq lovenox, SCDs, TEDs Weight bearing as tolerated.  Hemoglobin stable at 9.9 this AM. Plan for discharge when cleared by PT & medicine team. Son is concerned about disposition.  He states they are not able to accommodate her at home, but are concerned about SNF options. We will have him discuss with social work as discharge gets closer. Plan to get up with PT today.   12-23-1992, PA-C Orthopedic Surgery [phone] 02/24/2020, 8:20 AM

## 2020-02-24 NOTE — Progress Notes (Signed)
PT Cancellation Note  Patient Details Name: [name] [name] MRN: [id number] DOB: Jan 21, 1933   Cancelled Treatment:     PT deferred - pt with syncope on BSC this am and assisted back to bed total assist.  Pt received Ativan in pm.  Will follow.   Soul Hackman 02/24/2020, 2:45 PM

## 2020-02-24 NOTE — Progress Notes (Signed)
PROGRESS NOTE    [name] [name]  ATF:[id number] DOB: August 10, 1932 DOA: 02/21/2020 PCP: [name], NP    Brief Narrative:  86 y.o. female with medical history significant of dementia. Hx per daughter at bedside. She reports that the patient has become more confused over the past week or so. She is normally has intermittent periods of lucidness, but that has not happened over the past week. She has had more hallucinations (seeing people that aren't there) and has been more agitated. This morning she woke up and went to the living room around 2:20AM. Her daughter heard a crash and her mother call out for help. She found her mother had fallen on her right side and could not get up. She called for her brother's assistance to get her up. They called for EMS to transport to the ED.   ED Course: The patient was found to have a hip fracture. Orthopedics was called. TRH was called for admission  Assessment & Plan:   Active Problems:   Hip fracture [facility])  Fall Subcapital fracture of the right femoral neck      - Orthopedic Surgery following  -Pt now s/p surgery 10/21     - PT/OT consulted. Recommendations for 24hr supervision/SNF pending dispo  Hx of dementia with report of increasing confusion     - on namenda, buspar, xanax -Treating empirically for UTI per below -See below. Concerns of orthostatic hypotension. Namenda currently on hold -Pt noted to be occasionally confused and agitated. Continue with benzo as needed  Hypothyroidism     - continue levothyroxine as tolerated -TSH of 0.51  HTN     - continue norvasc as tolerated -BP presently stable and controlled while at rest  HLD     - continue zetia as tolerated  Likely UTI     - UA is concerning for UTI in the setting of reported increased confusion leading up to admission     - Urine cx is inconclusive      - Plan to empirically treat total 3 day course of abx, currently on rocephin, to complete on 10/24  Syncope,  vasovagal vs orthostatic -New event  -This AM, patient had syncope while moving bowels, associated with diaphoresis -Rapid response called. Pt noted to be bradycardic following event -Pt's son at bedside reports episodes of near syncope when standing from a sitting position in the setting of poor PO intake of fluids -Orthostatic vitals were ordered, however, pt had been agitated, thus unable to obtain at this time -Given history as well as above ARF and report of decreased fluid intake, will continue with IVF hydration for possible orthostatic hypotension -Family agrees to hold namenda for the time being given syncope  DVT prophylaxis: Lovenox subq Code Status: Full Family Communication: Pt in room, family is at bedside  Status is: Inpatient  Remains inpatient appropriate because:Unsafe d/c plan and Inpatient level of care appropriate due to severity of illness   Dispo: The patient is from: Home              Anticipated d/c is to: SNF              Anticipated d/c date is: 2 days              Patient currently is not medically stable to d/c.        Consultants:   Orthopedic Surgery  Procedures:   R hip hemiarthroplasty 10/21  Antimicrobials: Anti-infectives (From admission, onward)   Start  Dose/Rate Route Frequency Ordered Stop   02/22/20 1800  cefTRIAXone (ROCEPHIN) 1 g in sodium chloride 0.9 % 100 mL IVPB        1 g 200 mL/hr over 30 Minutes Intravenous Every 24 hours 02/22/20 1644 02/23/20 1736   02/21/20 1800  ceFAZolin (ANCEF) IVPB 2g/100 mL premix        2 g 200 mL/hr over 30 Minutes Intravenous Every 6 hours 02/21/20 1539 02/22/20 0000   02/21/20 1600  cefTRIAXone (ROCEPHIN) 1 g in sodium chloride 0.9 % 100 mL IVPB  Status:  Discontinued        1 g 200 mL/hr over 30 Minutes Intravenous Every 24 hours 02/21/20 1050 02/22/20 1504   02/21/20 1004  ceFAZolin (ANCEF) 2-4 GM/100ML-% IVPB       Note to Pharmacy: Linard Millers   : cabinet override      02/21/20  1004 02/21/20 1633      Subjective: Still nonverbal. Later in the morning, had syncopal episode while having bowel movement  Objective: Vitals:   02/24/20 1150 02/24/20 1155 02/24/20 1300 02/24/20 1400  BP: (!) 116/93 114/64    Pulse: (!) 59 62 64   Resp: 14 14 18 18   Temp:      TempSrc:      SpO2: 93% 94% 94%   Weight:      Height:        Intake/Output Summary (Last 24 hours) at 02/24/2020 1508 Last data filed at 02/24/2020 1400 Gross per 24 hour  Intake 540 ml  Output 450 ml  Net 90 ml   Filed Weights   02/21/20 1009  Weight: 66.7 kg    Examination: General exam: Not conversant, in no acute distress Respiratory system: normal chest rise, clear, no audible wheezing Cardiovascular system: regular rhythm, s1-s2 Gastrointestinal system: Nondistended, nontender, pos BS Central nervous system: No seizures, no tremors Extremities: No cyanosis, no joint deformities Skin: No rashes, no pallor Psychiatry: Difficult to assess as pt is not conversant  Data Reviewed: I have personally reviewed following labs and imaging studies  CBC: Recent Labs  Lab 02/21/20 0404 02/22/20 0316 02/23/20 0311 02/24/20 0258  WBC 8.2 10.8* 9.9 7.1  NEUTROABS 6.1  --   --   --   HGB 12.9 10.0* 10.9* 9.9*  HCT 39.5 30.3* 33.2* 29.7*  MCV 91.0 90.7 91.0 90.0  PLT 196 142* 162 148*   Basic Metabolic Panel: Recent Labs  Lab 02/21/20 0404 02/22/20 0316 02/23/20 0311 02/24/20 0258  NA 139 139 140 138  K 4.2 4.4 3.6 3.5  CL 104 107 105 102  CO2 27 24 24 25   GLUCOSE 97 118* 93 101*  BUN 14 14 15  25*  CREATININE 1.16* 0.81 0.77 1.12*  CALCIUM 10.1 8.9 9.3 9.1   GFR: Estimated Creatinine Clearance: 30.5 mL/min (A) (by C-G formula based on SCr of 1.12 mg/dL (H)). Liver Function Tests: Recent Labs  Lab 02/22/20 0316 02/24/20 0258  AST 16 18  ALT 10 10  ALKPHOS 27* 31*  BILITOT 0.4 0.9  PROT 5.8* 6.1*  ALBUMIN 3.3* 3.1*   No results for input(s): LIPASE, AMYLASE in the last  168 hours. No results for input(s): AMMONIA in the last 168 hours. Coagulation Profile: No results for input(s): INR, PROTIME in the last 168 hours. Cardiac Enzymes: No results for input(s): CKTOTAL, CKMB, CKMBINDEX, TROPONINI in the last 168 hours. BNP (last 3 results) No results for input(s): PROBNP in the last 8760 hours. HbA1C: No results for input(s):  HGBA1C in the last 72 hours. CBG: Recent Labs  Lab 02/23/20 1337 02/24/20 1139  GLUCAP 104* 102*   Lipid Profile: No results for input(s): CHOL, HDL, LDLCALC, TRIG, CHOLHDL, LDLDIRECT in the last 72 hours. Thyroid Function Tests: No results for input(s): TSH, T4TOTAL, FREET4, T3FREE, THYROIDAB in the last 72 hours. Anemia Panel: No results for input(s): VITAMINB12, FOLATE, FERRITIN, TIBC, IRON, RETICCTPCT in the last 72 hours. Sepsis Labs: No results for input(s): PROCALCITON, LATICACIDVEN in the last 168 hours.  Recent Results (from the past 240 hour(s))  Respiratory Panel by RT PCR (Flu A&B, Covid) - Nasopharyngeal Swab     Status: None   Collection Time: 02/21/20  4:53 AM   Specimen: Nasopharyngeal Swab  Result Value Ref Range Status   SARS Coronavirus 2 by RT PCR NEGATIVE NEGATIVE Final    Comment: (NOTE) SARS-CoV-2 target nucleic acids are NOT DETECTED.  The SARS-CoV-2 RNA is generally detectable in upper respiratoy specimens during the acute phase of infection. The lowest concentration of SARS-CoV-2 viral copies this assay can detect is 131 copies/mL. A negative result does not preclude SARS-Cov-2 infection and should not be used as the sole basis for treatment or other patient management decisions. A negative result may occur with  improper specimen collection/handling, submission of specimen other than nasopharyngeal swab, presence of viral mutation(s) within the areas targeted by this assay, and inadequate number of viral copies (<131 copies/mL). A negative result must be combined with clinical observations,  patient history, and epidemiological information. The expected result is Negative.  Fact Sheet for Patients:  https://www.moore.com/  Fact Sheet for Healthcare Providers:  https://www.young.biz/  This test is no t yet approved or cleared by the [place] FDA and  has been authorized for detection and/or diagnosis of SARS-CoV-2 by FDA under an Emergency Use Authorization (EUA). This EUA will remain  in effect (meaning this test can be used) for the duration of the COVID-19 declaration under Section 564(b)(1) of the Act, 21 U.S.C. section 360bbb-3(b)(1), unless the authorization is terminated or revoked sooner.     Influenza A by PCR NEGATIVE NEGATIVE Final   Influenza B by PCR NEGATIVE NEGATIVE Final    Comment: (NOTE) The Xpert Xpress SARS-CoV-2/FLU/RSV assay is intended as an aid in  the diagnosis of influenza from Nasopharyngeal swab specimens and  should not be used as a sole basis for treatment. Nasal washings and  aspirates are unacceptable for Xpert Xpress SARS-CoV-2/FLU/RSV  testing.  Fact Sheet for Patients: https://www.moore.com/  Fact Sheet for Healthcare Providers: https://www.young.biz/  This test is not yet approved or cleared by the [place] FDA and  has been authorized for detection and/or diagnosis of SARS-CoV-2 by  FDA under an Emergency Use Authorization (EUA). This EUA will remain  in effect (meaning this test can be used) for the duration of the  Covid-19 declaration under Section 564(b)(1) of the Act, 21  U.S.C. section 360bbb-3(b)(1), unless the authorization is  terminated or revoked. Performed at [facility], [address], [place], [place] [address]   Culture, Urine     Status: Abnormal   Collection Time: 02/21/20  7:06 AM   Specimen: Urine, Catheterized  Result Value Ref Range Status   Specimen Description   Final    URINE,  CATHETERIZED Performed at [facility], [address], [place], [place] [address]    Special Requests   Final    NONE Performed at [facility], [address] [place], [place]  [phone]    Culture MULTIPLE SPECIES PRESENT, SUGGEST RECOLLECTION (A)  Final   Report Status 02/23/2020 FINAL  Final     Radiology Studies: No results found.  Scheduled Meds: . amLODipine  10 mg Oral Daily  . aspirin  81 mg Oral Daily  . busPIRone  15 mg Oral BID  . Chlorhexidine Gluconate Cloth  6 each Topical Daily  . docusate sodium  100 mg Oral BID  . enoxaparin (LOVENOX) injection  40 mg Subcutaneous Q24H  . ezetimibe  10 mg Oral Daily  . latanoprost  1 drop Both Eyes QHS  . levothyroxine  100 mcg Oral QAC breakfast  . traZODone  100 mg Oral QHS   Continuous Infusions: . sodium chloride 75 mL/hr at 02/24/20 1152  . methocarbamol (ROBAXIN) IV       LOS: 3 days   Rickey BarbaraStephen Dirck Butch, MD [facility] Pager On Amion  If 7PM-7AM, please contact night-coverage 02/24/2020, 3:08 PM

## 2020-02-24 NOTE — Progress Notes (Addendum)
Patient agitated and refused vitals for 1300  (for MEWs documentation).Attempted again at 1400, pt still refused and removed pulse ox. Next due at 1500, will attempt again when calm. Ceasar Lund RN

## 2020-02-24 NOTE — Plan of Care (Signed)
  Problem: Clinical Measurements: Goal: Will remain free from infection Outcome: Progressing   Problem: Clinical Measurements: Goal: Diagnostic test results will improve Outcome: Progressing   Problem: Activity: Goal: Risk for activity intolerance will decrease Outcome: Progressing   

## 2020-02-25 LAB — CBC
HCT: 30.5 % — ABNORMAL LOW (ref 36.0–46.0)
Hemoglobin: 10 g/dL — ABNORMAL LOW (ref 12.0–15.0)
MCH: 30.1 pg (ref 26.0–34.0)
MCHC: 32.8 g/dL (ref 30.0–36.0)
MCV: 91.9 fL (ref 80.0–100.0)
Platelets: 166 10*3/uL (ref 150–400)
RBC: 3.32 MIL/uL — ABNORMAL LOW (ref 3.87–5.11)
RDW: 15.4 % (ref 11.5–15.5)
WBC: 6.6 10*3/uL (ref 4.0–10.5)
nRBC: 0 % (ref 0.0–0.2)

## 2020-02-25 LAB — COMPREHENSIVE METABOLIC PANEL
ALT: 11 U/L (ref 0–44)
AST: 16 U/L (ref 15–41)
Albumin: 3 g/dL — ABNORMAL LOW (ref 3.5–5.0)
Alkaline Phosphatase: 29 U/L — ABNORMAL LOW (ref 38–126)
Anion gap: 10 (ref 5–15)
BUN: 23 mg/dL (ref 8–23)
CO2: 22 mmol/L (ref 22–32)
Calcium: 8.7 mg/dL — ABNORMAL LOW (ref 8.9–10.3)
Chloride: 109 mmol/L (ref 98–111)
Creatinine, Ser: 0.76 mg/dL (ref 0.44–1.00)
GFR, Estimated: >60 mL/min (ref >60)
Glucose, Bld: 91 mg/dL (ref 70–99)
Potassium: 3.4 mmol/L — ABNORMAL LOW (ref 3.5–5.1)
Sodium: 141 mmol/L (ref 135–145)
Total Bilirubin: 0.9 mg/dL (ref 0.3–1.2)
Total Protein: 5.7 g/dL — ABNORMAL LOW (ref 6.5–8.1)

## 2020-02-25 MED ORDER — POTASSIUM CHLORIDE CRYS ER 20 MEQ PO TBCR
60.0000 meq | EXTENDED_RELEASE_TABLET | Freq: Once | ORAL | Status: AC
  Administered 2020-02-25: 60 meq via ORAL
  Filled 2020-02-25: qty 3

## 2020-02-25 MED ORDER — LIP MEDEX EX OINT
TOPICAL_OINTMENT | CUTANEOUS | Status: AC
  Filled 2020-02-25: qty 7

## 2020-02-25 MED ORDER — POLYETHYLENE GLYCOL 3350 17 G PO PACK
17.0000 g | PACK | Freq: Every day | ORAL | Status: DC
  Administered 2020-02-25 – 2020-02-27 (×3): 17 g via ORAL
  Filled 2020-02-25 (×3): qty 1

## 2020-02-25 NOTE — Progress Notes (Signed)
Occupational Therapy Treatment Patient Details Name: [name] [name] MRN: [id number] DOB: 08/21/1932 Today's Date: 02/25/2020    History of present illness 86 y.o. female with PMH of dementia, CAD, HTN with c/o R hip pain after fall at home, s/p R hip hemiarthroplasty AA 02/21/20.   OT comments  Patient progressing and showed improved overall mobility and ADL performance as evidenced by pt's increased score from 10/24 to 12/24 compared to previous session on pt's AM-PAC "6-Clicks" ADL performance measure. Patient limited by confusion, RT hip surgery, episodes of agitation and syncope per chart and generalized weakness, along with deficits noted below. Pt continues to demonstrate good rehab potential and would benefit from continued skilled OT to increase safety and independence with ADLs and functional transfers to allow pt to return home safely and reduce caregiver burden and fall risk.   Follow Up Recommendations  SNF    Equipment Recommendations       Recommendations for Other Services      Precautions / Restrictions Precautions Precautions: Fall Precaution Comments: vision in L eye only Restrictions Weight Bearing Restrictions: No RLE Weight Bearing: Weight bearing as tolerated       Mobility Bed Mobility Overal bed mobility: Needs Assistance Bed Mobility: Supine to Sit;Sit to Supine     Supine to sit: Min assist;HOB elevated Sit to supine: Mod assist   General bed mobility comments: Multimodal cues for sequencing for in and out of bed. Sit to supine, pt attempted to lie straight back and required increased assistance to locate [name].  Transfers Overall transfer level: Needs assistance Equipment used: Rolling walker (2 wheeled) Transfers: Sit to/from Stand Sit to Stand: Min assist         General transfer comment: Pt stood from EOB to RW with Min As and mulitmodal cues for hand and foot positioning.    Balance Overall balance assessment: Needs  assistance Sitting-balance support: Feet supported;Single extremity supported Sitting balance-Leahy Scale: Fair Sitting balance - Comments: Sitting EOB, statically.     Standing balance-Leahy Scale: Poor               High level balance activites: Direction changes High Level Balance Comments: Pt performed in-room ambulation with RW and Min As for balance as well as Total assist to navigate RW for 5' x2 to/from sink and turning 180 degrees x 2 with cues and increased time for safety.  Frequent checking in to assure of pt alertness while ambulating.  Pt also stood with Min As and threw paper towel with RUE to waste basket without LOB            ADL either performed or assessed with clinical judgement   ADL       Grooming: Standing;Wash/dry hands;Wash/dry face;Min guard;Cueing for sequencing           Upper Body Dressing : Bed level;Moderate assistance           Toileting- Clothing Manipulation and Hygiene: Bed level;Total assistance       Functional mobility during ADLs: Minimal assistance;Moderate assistance General ADL Comments: pt alert, disoriented but following 1-step instructions well with increased time for processing. Min As for bed mobility and transfers with exception of sit to supine where Mod assist was needed     Vision Patient Visual Report: Other (comment) Additional Comments: LT eye vision only.  RT eye blindess.   Perception     Praxis      Cognition Arousal/Alertness: Awake/alert   Overall Cognitive Status: History of cognitive impairments -  at baseline                                 General Comments: Per chart, speaking with pt's children, pt has days where she is more alert and following instructions, and other days where she requires increased assitance. Today, pt disoriented but pleasant and following 1-step instructions.        Exercises     Shoulder Instructions       General Comments      Pertinent Vitals/  Pain       Pain Assessment: Faces Faces Pain Scale: Hurts a little bit Pain Location: Rubbing RT hip at times.  Unable to retain information on hip surgery, and required reorientation to situation t/o session. Pain Intervention(s): Limited activity within patient's tolerance;Monitored during session (Pt reassured as needed.)  Home Living                                          Prior Functioning/Environment              Frequency  Min 2X/week        Progress Toward Goals  OT Goals(current goals can now be found in the care plan section)  Progress towards OT goals: Progressing toward goals  Acute Rehab OT Goals Time For Goal Achievement: 03/07/20 Potential to Achieve Goals: Good  Plan Discharge plan remains appropriate    Co-evaluation                 AM-PAC OT "6 Clicks" Daily Activity     Outcome Measure   Help from another person eating meals?: A Little Help from another person taking care of personal grooming?: A Little Help from another person toileting, which includes using toliet, bedpan, or urinal?: Total Help from another person bathing (including washing, rinsing, drying)?: A Lot Help from another person to put on and taking off regular upper body clothing?: A Lot Help from another person to put on and taking off regular lower body clothing?: Total 6 Click Score: 12    End of Session Equipment Utilized During Treatment: Gait belt;Rolling walker  OT Visit Diagnosis: Other abnormalities of gait and mobility (R26.89);Muscle weakness (generalized) (M62.81);History of falling (Z91.81)   Activity Tolerance Patient tolerated treatment well   Patient Left in bed;with call bell/phone within reach;with nursing/sitter in room;with bed alarm set   Nurse Communication Mobility status        Time: 1245-1314 OT Time Calculation (min): 29 min  Charges: OT General Charges $OT Visit: 1 Visit OT Treatments $Self Care/Home Management :  8-22 mins $Therapeutic Activity: 8-22 mins  Victorino Dike, OT Acute Rehab Services Office: [phone] 02/25/2020    Theodoro Clock 02/25/2020, 1:27 PM

## 2020-02-25 NOTE — Progress Notes (Signed)
Physical Therapy Treatment Patient Details Name: [name] [name] MRN: [id number] DOB: 03/16/1933 Today's Date: 02/25/2020    History of Present Illness 86 y.o. female with PMH of dementia, CAD, HTN with c/o R hip pain after fall at home, s/p R hip hemiarthroplasty AA 02/21/20.    PT Comments    POD #4 General Comments: AxO x1 follwing repeat simple commands Assisted OOB to [facility]. General bed mobility comments: 50% simple VC's assist supine to EOB with increased time.General transfer comment: 50% VC's on proper hand transfer and turn completion assisting from bed to [facility] then again off BSC.  General Gait Details: pt tolerated amb a limited distance 50 feet with assist to navigate walker and safety with turns.  Assisted back to room to [facility] with safety sitter in room.    Follow Up Recommendations  Supervision/Assistance - 24 hour;SNF     Equipment Recommendations       Recommendations for Other Services       Precautions / Restrictions Precautions Precautions: Fall Precaution Comments: vision in L eye only Restrictions Weight Bearing Restrictions: No RLE Weight Bearing: Weight bearing as tolerated    Mobility  Bed Mobility Overal bed mobility: Needs Assistance Bed Mobility: Supine to Sit     Supine to sit: Min assist Sit to supine: Mod assist   General bed mobility comments: 50% simple VC's assist supine to EOB with increased time  Transfers Overall transfer level: Needs assistance Equipment used: Rolling walker (2 wheeled);None Transfers: Sit to/from [employer] to Stand: Min assist Stand pivot transfers: Min assist;Mod assist       General transfer comment: 50% VC's on proper hand transfer and turn completion assisting from bed to [facility] then again off [facility]  Ambulation/Gait Ambulation/Gait assistance: Mod assist Gait Distance (Feet): 50 Feet Assistive device: Rolling walker (2 wheeled) Gait Pattern/deviations: Step-through  pattern;Shuffle;Decreased stride length Gait velocity: decreased   General Gait Details: pt tolerated amb a limited distance with assist to navigate walker and safety with turns   Stairs             Wheelchair Mobility    Modified Rankin (Stroke Patients Only)       Balance Overall balance assessment: Needs assistance Sitting-balance support: Feet supported;Single extremity supported Sitting balance-Leahy Scale: Fair Sitting balance - Comments: Sitting EOB, statically.     Standing balance-Leahy Scale: Poor               High level balance activites: Direction changes High Level Balance Comments: Pt performed in-room ambulation with RW and Min As for balance as well as Total assist to navigate RW for 5' x2 to/from sink and turning 180 degrees x 2 with cues and increased time for safety.  Frequent checking in to assure of pt alertness while ambulating.            Cognition Arousal/Alertness: Awake/alert Behavior During Therapy: Flat affect Overall Cognitive Status: History of cognitive impairments - at baseline                                 General Comments: AxO x1 follwing repeat simple commands      Exercises      General Comments        Pertinent Vitals/Pain Pain Assessment: Faces Faces Pain Scale: Hurts a little bit Pain Location: Rubbing RT hip at times.  Unable to retain information on hip surgery, and required reorientation to situation t/o  session. Pain Descriptors / Indicators: Grimacing Pain Intervention(s): Monitored during session;Repositioned    Home Living                      Prior Function            PT Goals (current goals can now be found in the care plan section) Progress towards PT goals: Progressing toward goals    Frequency    Min 2X/week      PT Plan Current plan remains appropriate    Co-evaluation              AM-PAC PT "6 Clicks" Mobility   Outcome Measure  Help needed  turning from your back to your side while in a flat bed without using bedrails?: A Little Help needed moving from lying on your back to sitting on the side of a flat bed without using bedrails?: A Little Help needed moving to and from a bed to a chair (including a wheelchair)?: A Little Help needed standing up from a chair using your arms (e.g., wheelchair or bedside chair)?: A Little Help needed to walk in hospital room?: A Lot Help needed climbing 3-5 steps with a railing? : Total 6 Click Score: 15    End of Session Equipment Utilized During Treatment: Gait belt Activity Tolerance: Patient tolerated treatment well;Other (comment) (impaired cognition) Patient left: Other (comment);with nursing/sitter in room;with call bell/phone within reach [facility]) Nurse Communication: Mobility status PT Visit Diagnosis: Other abnormalities of gait and mobility (R26.89);Muscle weakness (generalized) (M62.81)     Time: 8182-9937 PT Time Calculation (min) (ACUTE ONLY): 20 min  Charges:  $Gait Training: 8-22 mins                     Felecia Shelling  PTA Acute  Rehabilitation Services Pager      [phone] Office      [phone]

## 2020-02-25 NOTE — TOC Progression Note (Signed)
Transition of Care [facility]) - Progression Note    Patient Details  Name: Saretta Dahlem MRN: [id number] Date of Birth: 1932-12-05  Transition of Care [facility]) CM/SW Contact  Armanda Heritage, RN Phone Number: 02/25/2020, 11:59 AM  Clinical Narrative: CM spoke with patient's daughter who patient lives with and is her primary caregiver.  Daughter is in agreement with SNF for short term rehab.  FL2 faxed out to area facilities.  Daughter shares that she has spoken with friends/family who have recommended [facility] or [facility].  CM discussed that it is possible that neither facility could have an open bed and daughter is agreeable to for FL2 to be sent to all facilities to maximize bed offers/choices.        Expected Discharge Plan: Skilled Nursing Facility Barriers to Discharge: Continued Medical Work up  Expected Discharge Plan and Services Expected Discharge Plan: Skilled Nursing Facility   Discharge Planning Services: CM Consult Post Acute Care Choice: Skilled Nursing Facility Living arrangements for the past 2 months: Single Family Home                                       Social Determinants of Health (SDOH) Interventions    Readmission Risk Interventions No flowsheet data found.

## 2020-02-25 NOTE — Progress Notes (Signed)
PROGRESS NOTE    [name] [name]  WUJ:[id number] DOB: Apr 15, 1933 DOA: 02/21/2020 PCP: Sharon SellerEubanks, Jessica K, NP    Brief Narrative:  86 y.o. female with medical history significant of dementia. Hx per daughter at bedside. She reports that the patient has become more confused over the past week or so. She is normally has intermittent periods of lucidness, but that has not happened over the past week. She has had more hallucinations (seeing people that aren't there) and has been more agitated. This morning she woke up and went to the living room around 2:20AM. Her daughter heard a crash and her mother call out for help. She found her mother had fallen on her right side and could not get up. She called for her brother's assistance to get her up. They called for EMS to transport to the ED.   ED Course: The patient was found to have a hip fracture. Orthopedics was called. [facility] was called for admission  Assessment & Plan:   Active Problems:   Hip fracture [facility](HCC)  Fall Subcapital fracture of the right femoral neck      - Orthopedic Surgery following  -Pt now s/p surgery 10/21     - PT/OT consulted. Recommendations for 24hr supervision/SNF pending dispo  Hx of dementia with report of increasing confusion     - on namenda, buspar, xanax -Completed empiric tx for UTI per below -See below. Concerns of orthostatic hypotension. Namenda currently on hold -Pt noted to be occasionally confused and agitated. Continue with haldol as needed -Have changed trazodone to nightly seroquel -Family reports mentation seems improved this AM  Hypothyroidism     - continue levothyroxine as pt tolerates -TSH of 0.51  HTN     - continue norvasc as tolerated -BP presently stable and controlled while at rest -Will repeat orthostatic vitals  HLD     - continue zetia as tolerated  Likely UTI     - UA is concerning for UTI in the setting of reported increased confusion leading up to admission     - Urine cx  is inconclusive      - Treated for UTI empirically with rocephin  Syncope, vasovagal vs orthostatic -New event this visit -Recently, patient had syncope while moving bowels, associated with diaphoresis -Rapid response called. Pt noted to be bradycardic following event -Pt's son at bedside reports episodes of near syncope when standing from a sitting position in the setting of poor PO intake of fluids -Orthostatic vitals confirm orthostatic hypotension from sitting to standing -Given IVF hydration -Family agrees to cont to hold namenda for the time being given syncope  DVT prophylaxis: Lovenox subq Code Status: Full Family Communication: Pt in room, family is at bedside feeding pt  Status is: Inpatient  Remains inpatient appropriate because:Unsafe d/c plan and Inpatient level of care appropriate due to severity of illness   Dispo: The patient is from: Home              Anticipated d/c is to: SNF              Anticipated d/c date is: 2 days              Patient currently is not medically stable to d/c.        Consultants:   Orthopedic Surgery  Procedures:   R hip hemiarthroplasty 10/21  Antimicrobials: Anti-infectives (From admission, onward)   Start     Dose/Rate Route Frequency Ordered Stop   02/22/20 1800  cefTRIAXone (  ROCEPHIN) 1 g in sodium chloride 0.9 % 100 mL IVPB        1 g 200 mL/hr over 30 Minutes Intravenous Every 24 hours 02/22/20 1644 02/23/20 1736   02/21/20 1800  ceFAZolin (ANCEF) IVPB 2g/100 mL premix        2 g 200 mL/hr over 30 Minutes Intravenous Every 6 hours 02/21/20 1539 02/22/20 0000   02/21/20 1600  cefTRIAXone (ROCEPHIN) 1 g in sodium chloride 0.9 % 100 mL IVPB  Status:  Discontinued        1 g 200 mL/hr over 30 Minutes Intravenous Every 24 hours 02/21/20 1050 02/22/20 1504   02/21/20 1004  ceFAZolin (ANCEF) 2-4 GM/100ML-% IVPB       Note to Pharmacy: Linard Millers   : cabinet override      02/21/20 1004 02/21/20 1633       Subjective: Remains nonverbal but seems more alert, being fed by pt's daughter  Objective: Vitals:   02/24/20 1922 02/24/20 2316 02/25/20 0642 02/25/20 1333  BP: 119/76 109/63 (!) 155/83 125/66  Pulse: 86 (!) 58 68 68  Resp: 20 20 16 20   Temp: 97.7 F (36.5 C) 97.7 F (36.5 C) 97.7 F (36.5 C) 98.8 F (37.1 C)  TempSrc: Axillary Oral Oral Axillary  SpO2: 97% 95% 100% 96%  Weight:      Height:        Intake/Output Summary (Last 24 hours) at 02/25/2020 1552 Last data filed at 02/25/2020 1400 Gross per 24 hour  Intake 1999.09 ml  Output --  Net 1999.09 ml   Filed Weights   02/21/20 1009  Weight: 66.7 kg    Examination: General exam: Not conversant, in no acute distress Respiratory system: normal chest rise, clear, no audible wheezing Cardiovascular system: regular rhythm, s1-s2 Gastrointestinal system: Nondistended, nontender, pos BS Central nervous system: No seizures, no tremors Extremities: No cyanosis, no joint deformities Skin: No rashes, no pallor Psychiatry: Difficult to assess given mentation   Data Reviewed: I have personally reviewed following labs and imaging studies  CBC: Recent Labs  Lab 02/21/20 0404 02/22/20 0316 02/23/20 0311 02/24/20 0258 02/25/20 0337  WBC 8.2 10.8* 9.9 7.1 6.6  NEUTROABS 6.1  --   --   --   --   HGB 12.9 10.0* 10.9* 9.9* 10.0*  HCT 39.5 30.3* 33.2* 29.7* 30.5*  MCV 91.0 90.7 91.0 90.0 91.9  PLT 196 142* 162 148* 166   Basic Metabolic Panel: Recent Labs  Lab 02/21/20 0404 02/22/20 0316 02/23/20 0311 02/24/20 0258 02/25/20 0337  NA 139 139 140 138 141  K 4.2 4.4 3.6 3.5 3.4*  CL 104 107 105 102 109  CO2 27 24 24 25 22   GLUCOSE 97 118* 93 101* 91  BUN 14 14 15  25* 23  CREATININE 1.16* 0.81 0.77 1.12* 0.76  CALCIUM 10.1 8.9 9.3 9.1 8.7*   GFR: Estimated Creatinine Clearance: 42.7 mL/min (by C-G formula based on SCr of 0.76 mg/dL). Liver Function Tests: Recent Labs  Lab 02/22/20 0316 02/24/20 0258  02/25/20 0337  AST 16 18 16   ALT 10 10 11   ALKPHOS 27* 31* 29*  BILITOT 0.4 0.9 0.9  PROT 5.8* 6.1* 5.7*  ALBUMIN 3.3* 3.1* 3.0*   No results for input(s): LIPASE, AMYLASE in the last 168 hours. No results for input(s): AMMONIA in the last 168 hours. Coagulation Profile: No results for input(s): INR, PROTIME in the last 168 hours. Cardiac Enzymes: No results for input(s): CKTOTAL, CKMB, CKMBINDEX, TROPONINI in the  last 168 hours. BNP (last 3 results) No results for input(s): PROBNP in the last 8760 hours. HbA1C: No results for input(s): HGBA1C in the last 72 hours. CBG: Recent Labs  Lab 02/23/20 1337 02/24/20 1139  GLUCAP 104* 102*   Lipid Profile: No results for input(s): CHOL, HDL, LDLCALC, TRIG, CHOLHDL, LDLDIRECT in the last 72 hours. Thyroid Function Tests: No results for input(s): TSH, T4TOTAL, FREET4, T3FREE, THYROIDAB in the last 72 hours. Anemia Panel: No results for input(s): VITAMINB12, FOLATE, FERRITIN, TIBC, IRON, RETICCTPCT in the last 72 hours. Sepsis Labs: No results for input(s): PROCALCITON, LATICACIDVEN in the last 168 hours.  Recent Results (from the past 240 hour(s))  Respiratory Panel by RT PCR (Flu A&B, Covid) - Nasopharyngeal Swab     Status: None   Collection Time: 02/21/20  4:53 AM   Specimen: Nasopharyngeal Swab  Result Value Ref Range Status   SARS Coronavirus 2 by RT PCR NEGATIVE NEGATIVE Final    Comment: (NOTE) SARS-CoV-2 target nucleic acids are NOT DETECTED.  The SARS-CoV-2 RNA is generally detectable in upper respiratoy specimens during the acute phase of infection. The lowest concentration of SARS-CoV-2 viral copies this assay can detect is 131 copies/mL. A negative result does not preclude SARS-Cov-2 infection and should not be used as the sole basis for treatment or other patient management decisions. A negative result may occur with  improper specimen collection/handling, submission of specimen other than nasopharyngeal swab,  presence of viral mutation(s) within the areas targeted by this assay, and inadequate number of viral copies (<131 copies/mL). A negative result must be combined with clinical observations, patient history, and epidemiological information. The expected result is Negative.  Fact Sheet for Patients:  https://www.moore.com/  Fact Sheet for Healthcare Providers:  https://www.young.biz/  This test is no t yet approved or cleared by the [place] FDA and  has been authorized for detection and/or diagnosis of SARS-CoV-2 by FDA under an Emergency Use Authorization (EUA). This EUA will remain  in effect (meaning this test can be used) for the duration of the COVID-19 declaration under Section 564(b)(1) of the Act, 21 U.S.C. section 360bbb-3(b)(1), unless the authorization is terminated or revoked sooner.     Influenza A by PCR NEGATIVE NEGATIVE Final   Influenza B by PCR NEGATIVE NEGATIVE Final    Comment: (NOTE) The Xpert Xpress SARS-CoV-2/FLU/RSV assay is intended as an aid in  the diagnosis of influenza from Nasopharyngeal swab specimens and  should not be used as a sole basis for treatment. Nasal washings and  aspirates are unacceptable for Xpert Xpress SARS-CoV-2/FLU/RSV  testing.  Fact Sheet for Patients: https://www.moore.com/  Fact Sheet for Healthcare Providers: https://www.young.biz/  This test is not yet approved or cleared by the [place] FDA and  has been authorized for detection and/or diagnosis of SARS-CoV-2 by  FDA under an Emergency Use Authorization (EUA). This EUA will remain  in effect (meaning this test can be used) for the duration of the  Covid-19 declaration under Section 564(b)(1) of the Act, 21  U.S.C. section 360bbb-3(b)(1), unless the authorization is  terminated or revoked. Performed at [facility], [address], [place], [place] [address]    Culture, Urine     Status: Abnormal   Collection Time: 02/21/20  7:06 AM   Specimen: Urine, Catheterized  Result Value Ref Range Status   Specimen Description   Final    URINE, CATHETERIZED Performed at [facility], [address] [place], [place] [address]  Special Requests   Final    NONE Performed at [facility], [address] [place], [place] [address]    Culture MULTIPLE SPECIES PRESENT, SUGGEST RECOLLECTION (A)  Final   Report Status 02/23/2020 FINAL  Final     Radiology Studies: No results found.  Scheduled Meds: . amLODipine  10 mg Oral Daily  . aspirin  81 mg Oral Daily  . busPIRone  15 mg Oral BID  . docusate sodium  100 mg Oral BID  . enoxaparin (LOVENOX) injection  40 mg Subcutaneous Q24H  . ezetimibe  10 mg Oral Daily  . latanoprost  1 drop Both Eyes QHS  . levothyroxine  100 mcg Oral QAC breakfast  . polyethylene glycol  17 g Oral Daily  . QUEtiapine  25 mg Oral QHS   Continuous Infusions: . methocarbamol (ROBAXIN) IV       LOS: 4 days   Rickey Barbara, MD [facility] Pager On Amion  If 7PM-7AM, please contact night-coverage 02/25/2020, 3:52 PM

## 2020-02-25 NOTE — Progress Notes (Signed)
    Subjective:  Patient reports pain as mild.  Denies N/V/CP/SOB. Patient is using the bedside commode with nursing assistance during rounds this morning  Objective:   VITALS:   Vitals:   02/24/20 1500 02/24/20 1922 02/24/20 2316 02/25/20 0642  BP: 126/76 119/76 109/63 (!) 155/83  Pulse: 83 86 (!) 58 68  Resp: 16 20 20 16   Temp: 98 F (36.7 C) 97.7 F (36.5 C) 97.7 F (36.5 C) 97.7 F (36.5 C)  TempSrc: Oral Axillary Oral Oral  SpO2:  97% 95% 100%  Weight:      Height:        NAD ABD soft Neurovascular intact Sensation intact distally Intact pulses distally Dorsiflexion/Plantar flexion intact Incision: dressing C/D/I   Lab Results  Component Value Date   WBC 6.6 02/25/2020   HGB 10.0 (L) 02/25/2020   HCT 30.5 (L) 02/25/2020   MCV 91.9 02/25/2020   PLT 166 02/25/2020   BMET    Component Value Date/Time   NA 141 02/25/2020 0337   NA 142 05/14/2019 0000   K 3.4 (L) 02/25/2020 0337   CL 109 02/25/2020 0337   CO2 22 02/25/2020 0337   GLUCOSE 91 02/25/2020 0337   BUN 23 02/25/2020 0337   BUN 11 05/14/2019 0000   CREATININE 0.76 02/25/2020 0337   CREATININE 0.94 (H) 02/15/2020 1136   CALCIUM 8.7 (L) 02/25/2020 0337   GFRNONAA >60 02/25/2020 0337   GFRNONAA 55 (L) 02/15/2020 1136   GFRAA 64 02/15/2020 1136     Assessment/Plan: 4 Days Post-Op   Active Problems:   Hip fracture (HCC)   WBAT with walker DVT ppx: Aspirin, SCDs, TEDS.   PT has been delayed due to medical issues. Treated per hospitalist recommendations PO pain control PT/OT Dispo: D/C once PT cleared.  Rx on chart    02/17/2020 02/25/2020, 8:31 AM  [facility] is now [facility] [address], [address], [place], [place] Phone: [phone] [email or web address] Facebook  [phone]

## 2020-02-25 NOTE — NC FL2 (Signed)
[place] MEDICAID FL2 LEVEL OF CARE SCREENING TOOL     IDENTIFICATION  Patient Name: [name] [name] Birthdate: 03-Aug-1932 Sex: female Admission Date (Current Location): 02/21/2020  [facility] and [place] Number:  Producer, television/film/video and Address:  [facility],  [address] [place], [place] [address]      Provider Number: [id number]  Attending Physician Name and Address:  Jerald Kief, MD  Relative Name and Phone Number:       Current Level of Care: Hospital Recommended Level of Care: Skilled Nursing Facility Prior Approval Number:    Date Approved/Denied:   PASRR Number: [id number]  Discharge Plan: SNF    Current Diagnoses: Patient Active Problem List   Diagnosis Date Noted  . Hip fracture (HCC) 02/21/2020  . Generalized anxiety disorder 05/21/2019  . Insomnia 05/21/2019  . Vitamin D deficiency 05/21/2019  . Dementia with behavioral disturbance (HCC) 05/21/2019  . Obesity 05/21/2019  . Hypertensive heart disease without congestive heart failure 05/21/2019  . Coronary artery disease   . Ischemic cardiomyopathy   . Carotid stenosis   . 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 HLD (hyperlipidemia) 02/11/2014  . Hypothyroidism 02/11/2014  . Unstable angina (HCC) 02/10/2014    Orientation RESPIRATION BLADDER Height & Weight     Self  Normal Continent Weight: 66.7 kg Height:  4\' 11"  (149.9 cm)  BEHAVIORAL SYMPTOMS/MOOD NEUROLOGICAL BOWEL NUTRITION STATUS      Incontinent Diet  AMBULATORY STATUS COMMUNICATION OF NEEDS Skin   Extensive Assist Verbally Normal                       Personal Care Assistance Level of Assistance  Bathing, Dressing, Total care Bathing Assistance: Maximum assistance   Dressing Assistance: Maximum assistance Total Care Assistance: Maximum assistance   Functional Limitations Info             SPECIAL CARE FACTORS FREQUENCY  PT (By licensed PT), OT (By licensed OT)     PT Frequency: 5x weekly OT Frequency: 5x weekly             Contractures Contractures Info: Not present    Additional Factors Info  Code Status, Allergies Code Status Info: Full Allergies Info: NKDA           Current Medications (02/25/2020):  This is the current hospital active medication list Current Facility-Administered Medications  Medication Dose Route Frequency Provider Last Rate Last Admin  . acetaminophen (TYLENOL) tablet 325-650 mg  325-650 mg Oral Q6H PRN 02/27/2020, MD   650 mg at 02/24/20 0918  . ALPRAZolam 02/26/20) tablet 0.25 mg  0.25 mg Oral BID PRN Prudy Feeler A, DO   0.25 mg at 02/23/20 2005  . amLODipine (NORVASC) tablet 10 mg  10 mg Oral Daily Kyle, Tyrone A, DO   10 mg at 02/25/20 1121  . aspirin chewable tablet 81 mg  81 mg Oral Daily Kyle, Tyrone A, DO   81 mg at 02/25/20 1121  . busPIRone (BUSPAR) tablet 15 mg  15 mg Oral BID 02/27/20, Tyrone A, DO   15 mg at 02/25/20 1122  . docusate sodium (COLACE) capsule 100 mg  100 mg Oral BID 02/27/20, MD   100 mg at 02/25/20 1121  . enoxaparin (LOVENOX) injection 40 mg  40 mg Subcutaneous Q24H 02/27/20, MD   40 mg at 02/25/20 0804  . ezetimibe (ZETIA) tablet 10 mg  10 mg Oral Daily Kyle, Tyrone A, DO   10 mg  at 02/25/20 1121  . haloperidol lactate (HALDOL) injection 5 mg  5 mg Intravenous Q6H PRN Jerald Kief, MD   5 mg at 02/24/20 1748  . HYDROcodone-acetaminophen (NORCO) 7.5-325 MG per tablet 1-2 tablet  1-2 tablet Oral Q4H PRN Samson Frederic, MD   1 tablet at 02/23/20 0318  . HYDROcodone-acetaminophen (NORCO/VICODIN) 5-325 MG per tablet 1-2 tablet  1-2 tablet Oral Q4H PRN Samson Frederic, MD   1 tablet at 02/22/20 1738  . latanoprost (XALATAN) 0.005 % ophthalmic solution 1 drop  1 drop Both Eyes QHS Kyle, Tyrone A, DO   1 drop at 02/23/20 2014  . levothyroxine (SYNTHROID) tablet 100 mcg  100 mcg Oral QAC breakfast Ronaldo Miyamoto, Tyrone A, DO   100 mcg at 02/25/20 0644  . LORazepam (ATIVAN) injection 0.5 mg  0.5 mg Intravenous Q6H PRN Audrea Muscat T, NP   0.5 mg  at 02/24/20 2037  . menthol-cetylpyridinium (CEPACOL) lozenge 3 mg  1 lozenge Oral PRN Swinteck, Arlys John, MD       Or  . phenol (CHLORASEPTIC) mouth spray 1 spray  1 spray Mouth/Throat PRN Swinteck, Arlys John, MD      . methocarbamol (ROBAXIN) tablet 500 mg  500 mg Oral Q6H PRN Swinteck, Arlys John, MD       Or  . methocarbamol (ROBAXIN) 500 mg in dextrose 5 % 50 mL IVPB  500 mg Intravenous Q6H PRN Swinteck, Arlys John, MD      . metoCLOPramide (REGLAN) tablet 5-10 mg  5-10 mg Oral Q8H PRN Swinteck, Arlys John, MD       Or  . metoCLOPramide (REGLAN) injection 5-10 mg  5-10 mg Intravenous Q8H PRN Swinteck, Arlys John, MD      . morphine 2 MG/ML injection 0.5-1 mg  0.5-1 mg Intravenous Q2H PRN Swinteck, Arlys John, MD      . ondansetron (ZOFRAN) tablet 4 mg  4 mg Oral Q6H PRN Swinteck, Arlys John, MD       Or  . ondansetron (ZOFRAN) injection 4 mg  4 mg Intravenous Q6H PRN Swinteck, Arlys John, MD      . polyethylene glycol (MIRALAX / GLYCOLAX) packet 17 g  17 g Oral Daily Jerald Kief, MD   17 g at 02/25/20 1121  . QUEtiapine (SEROQUEL) tablet 25 mg  25 mg Oral QHS Jerald Kief, MD         Discharge Medications: Please see discharge summary for a list of discharge medications.  Relevant Imaging Results:  Relevant Lab Results:   Additional Information SSN [id number]  Armanda Heritage, RN

## 2020-02-25 NOTE — Care Management Important Message (Signed)
Important Message  Patient Details IM Letter given to the Patient Name: [name] [name] MRN: [id number] Date of Birth: 1932-05-30   Medicare Important Message Given:  Yes     Caren Macadam 02/25/2020, 1:15 PM

## 2020-02-25 NOTE — Plan of Care (Signed)
  Problem: Clinical Measurements: Goal: Ability to maintain clinical measurements within normal limits will improve Outcome: Progressing Goal: Will remain free from infection Outcome: Progressing Goal: Diagnostic test results will improve Outcome: Progressing Goal: Respiratory complications will improve Outcome: Progressing Goal: Cardiovascular complication will be avoided Outcome: Progressing   Problem: Activity: Goal: Risk for activity intolerance will decrease Outcome: Progressing   Problem: Nutrition: Goal: Adequate nutrition will be maintained Outcome: Progressing   Problem: Coping: Goal: Level of anxiety will decrease Outcome: Progressing   Problem: Elimination: Goal: Will not experience complications related to bowel motility Outcome: Progressing Goal: Will not experience complications related to urinary retention Outcome: Progressing   Problem: Pain Managment: Goal: General experience of comfort will improve Outcome: Progressing   Problem: Safety: Goal: Ability to remain free from injury will improve Outcome: Progressing   Problem: Skin Integrity: Goal: Risk for impaired skin integrity will decrease Outcome: Progressing   Problem: Education: Goal: Knowledge of the prescribed therapeutic regimen will improve Outcome: Progressing Goal: Understanding of discharge needs will improve Outcome: Progressing   Problem: Activity: Goal: Ability to avoid complications of mobility impairment will improve Outcome: Progressing Goal: Ability to tolerate increased activity will improve Outcome: Progressing   Problem: Clinical Measurements: Goal: Postoperative complications will be avoided or minimized Outcome: Progressing   Problem: Pain Management: Goal: Pain level will decrease with appropriate interventions Outcome: Progressing   Problem: Skin Integrity: Goal: Will show signs of wound healing Outcome: Progressing

## 2020-02-26 ENCOUNTER — Encounter (HOSPITAL_COMMUNITY): Payer: Self-pay | Admitting: Orthopedic Surgery

## 2020-02-26 DIAGNOSIS — S72001A Fracture of unspecified part of neck of right femur, initial encounter for closed fracture: Secondary | ICD-10-CM

## 2020-02-26 LAB — COMPREHENSIVE METABOLIC PANEL
ALT: 10 U/L (ref 0–44)
AST: 14 U/L — ABNORMAL LOW (ref 15–41)
Albumin: 3.1 g/dL — ABNORMAL LOW (ref 3.5–5.0)
Alkaline Phosphatase: 31 U/L — ABNORMAL LOW (ref 38–126)
Anion gap: 7 (ref 5–15)
BUN: 12 mg/dL (ref 8–23)
CO2: 24 mmol/L (ref 22–32)
Calcium: 9.1 mg/dL (ref 8.9–10.3)
Chloride: 112 mmol/L — ABNORMAL HIGH (ref 98–111)
Creatinine, Ser: 0.62 mg/dL (ref 0.44–1.00)
GFR, Estimated: >60 mL/min (ref >60)
Glucose, Bld: 103 mg/dL — ABNORMAL HIGH (ref 70–99)
Potassium: 3.7 mmol/L (ref 3.5–5.1)
Sodium: 143 mmol/L (ref 135–145)
Total Bilirubin: 0.8 mg/dL (ref 0.3–1.2)
Total Protein: 6.1 g/dL — ABNORMAL LOW (ref 6.5–8.1)

## 2020-02-26 MED ORDER — LORAZEPAM 2 MG/ML IJ SOLN
1.0000 mg | INTRAMUSCULAR | Status: DC | PRN
  Administered 2020-02-27: 1 mg via INTRAMUSCULAR
  Filled 2020-02-26: qty 1

## 2020-02-26 MED ORDER — SODIUM CHLORIDE 0.9 % IV SOLN: INTRAVENOUS | Status: AC

## 2020-02-26 NOTE — Progress Notes (Signed)
PROGRESS NOTE    [name] [name]  TKW:[id number] DOB: Nov 17, 1932 DOA: 02/21/2020 PCP: [name], NP    Brief Narrative:  86 y.o. female with medical history significant of dementia. Hx per daughter at bedside. She reports that the patient has become more confused over the past week or so. She is normally has intermittent periods of lucidness, but that has not happened over the past week. She has had more hallucinations (seeing people that aren't there) and has been more agitated. This morning she woke up and went to the living room around 2:20AM. Her daughter heard a crash and her mother call out for help. She found her mother had fallen on her right side and could not get up. She called for her brother's assistance to get her up. They called for EMS to transport to the ED.   ED Course: The patient was found to have a hip fracture. Orthopedics was called. TRH was called for admission  Assessment & Plan:   Active Problems:   Hip fracture [facility])  Fall Subcapital fracture of the right femoral neck      - Orthopedic Surgery following  -Pt now s/p surgery 10/21     - PT/OT consulted. Recommendations for 24hr supervision/SNF pending dispo  Hx of dementia with report of increasing confusion     - on namenda, buspar, xanax -Completed empiric tx for UTI per below -See below. Concerns of orthostatic hypotension. Namenda presently on hold -Pt noted to be occasionally confused and agitated. Continue with haldol as needed -Have changed trazodone to nightly seroquel with good results at night, per family -Family reports mentation seems improved this AM -Currently no longer needs safety sitter  Hypothyroidism     - continue levothyroxine as pt tolerates -TSH of 0.51  HTN     - continue norvasc as tolerated -BP presently stable while at rest -Will repeat orthostatic vitals  HLD     - continue zetia as tolerated  Likely UTI     - UA is concerning for UTI in the setting of  reported increased confusion leading up to admission     - Urine cx is inconclusive      - Completed empiric treatment with rocephin  Syncope, vasovagal vs orthostatic -New event during this encounter -Recently, patient had syncope while moving bowels, associated with diaphoresis -Rapid response called. Pt noted to be bradycardic following event -Pt's son at bedside reports episodes of near syncope when standing from a sitting position in the setting of poor PO intake of fluids -Orthostatic hypotension confirmed from sitting to standing per BP -Repeat orthostatic vitals today much improved, although sbp did drop from sitting to standing -Family agrees to cont to hold namenda for the time being given syncope -Will continue 8hrs more of NaCl at 50cc/hr  DVT prophylaxis: Lovenox subq Code Status: Full Family Communication: Pt in room, family is at bedside feeding pt  Status is: Inpatient  Remains inpatient appropriate because:Unsafe d/c plan and Inpatient level of care appropriate due to severity of illness   Dispo: The patient is from: Home              Anticipated d/c is to: SNF              Anticipated d/c date is: 1 day              Patient currently is not medically stable to d/c.        Consultants:   Orthopedic Surgery  Procedures:   R hip hemiarthroplasty 10/21  Antimicrobials: Anti-infectives (From admission, onward)   Start     Dose/Rate Route Frequency Ordered Stop   02/22/20 1800  cefTRIAXone (ROCEPHIN) 1 g in sodium chloride 0.9 % 100 mL IVPB        1 g 200 mL/hr over 30 Minutes Intravenous Every 24 hours 02/22/20 1644 02/23/20 1736   02/21/20 1800  ceFAZolin (ANCEF) IVPB 2g/100 mL premix        2 g 200 mL/hr over 30 Minutes Intravenous Every 6 hours 02/21/20 1539 02/22/20 0000   02/21/20 1600  cefTRIAXone (ROCEPHIN) 1 g in sodium chloride 0.9 % 100 mL IVPB  Status:  Discontinued        1 g 200 mL/hr over 30 Minutes Intravenous Every 24 hours  02/21/20 1050 02/22/20 1504   02/21/20 1004  ceFAZolin (ANCEF) 2-4 GM/100ML-% IVPB       Note to Pharmacy: [name], [name]   : cabinet override      02/21/20 1004 02/21/20 1633      Subjective: More alert and now conversant. Reports no complaints  Objective: Vitals:   02/25/20 1333 02/25/20 2100 02/26/20 0627 02/26/20 1454  BP: 125/66 119/69 128/68 (!) 142/89  Pulse: 68 68 68 83  Resp: 20 17 17 20   Temp: 98.8 F (37.1 C) 97.7 F (36.5 C) 98.6 F (37 C) 99.2 F (37.3 C)  TempSrc: Axillary Axillary    SpO2: 96% 96% 99% 100%  Weight:      Height:        Intake/Output Summary (Last 24 hours) at 02/26/2020 1656 Last data filed at 02/26/2020 1500 Gross per 24 hour  Intake 1230 ml  Output 1250 ml  Net -20 ml   Filed Weights   02/21/20 1009  Weight: 66.7 kg    Examination: General exam: Awake, sitting in chair Respiratory system: Normal respiratory effort, no wheezing Cardiovascular system: regular rate, s1, s2 Gastrointestinal system: Soft, nondistended, positive BS Central nervous system: CN2-12 grossly intact, strength intact Extremities: Perfused, no clubbing Skin: Normal skin turgor, no notable skin lesions seen Psychiatry: Mood normal // no visual hallucinations   Data Reviewed: I have personally reviewed following labs and imaging studies  CBC: Recent Labs  Lab 02/21/20 0404 02/22/20 0316 02/23/20 0311 02/24/20 0258 02/25/20 0337  WBC 8.2 10.8* 9.9 7.1 6.6  NEUTROABS 6.1  --   --   --   --   HGB 12.9 10.0* 10.9* 9.9* 10.0*  HCT 39.5 30.3* 33.2* 29.7* 30.5*  MCV 91.0 90.7 91.0 90.0 91.9  PLT 196 142* 162 148* 166   Basic Metabolic Panel: Recent Labs  Lab 02/22/20 0316 02/23/20 0311 02/24/20 0258 02/25/20 0337 02/26/20 0823  NA 139 140 138 141 143  K 4.4 3.6 3.5 3.4* 3.7  CL 107 105 102 109 112*  CO2 24 24 25 22 24   GLUCOSE 118* 93 101* 91 103*  BUN 14 15 25* 23 12  CREATININE 0.81 0.77 1.12* 0.76 0.62  CALCIUM 8.9 9.3 9.1 8.7* 9.1    GFR: Estimated Creatinine Clearance: 42.7 mL/min (by C-G formula based on SCr of 0.62 mg/dL). Liver Function Tests: Recent Labs  Lab 02/22/20 0316 02/24/20 0258 02/25/20 0337 02/26/20 0823  AST 16 18 16  14*  ALT 10 10 11 10   ALKPHOS 27* 31* 29* 31*  BILITOT 0.4 0.9 0.9 0.8  PROT 5.8* 6.1* 5.7* 6.1*  ALBUMIN 3.3* 3.1* 3.0* 3.1*   No results for input(s): LIPASE, AMYLASE in the last  168 hours. No results for input(s): AMMONIA in the last 168 hours. Coagulation Profile: No results for input(s): INR, PROTIME in the last 168 hours. Cardiac Enzymes: No results for input(s): CKTOTAL, CKMB, CKMBINDEX, TROPONINI in the last 168 hours. BNP (last 3 results) No results for input(s): PROBNP in the last 8760 hours. HbA1C: No results for input(s): HGBA1C in the last 72 hours. CBG: Recent Labs  Lab 02/23/20 1337 02/24/20 1139  GLUCAP 104* 102*   Lipid Profile: No results for input(s): CHOL, HDL, LDLCALC, TRIG, CHOLHDL, LDLDIRECT in the last 72 hours. Thyroid Function Tests: No results for input(s): TSH, T4TOTAL, FREET4, T3FREE, THYROIDAB in the last 72 hours. Anemia Panel: No results for input(s): VITAMINB12, FOLATE, FERRITIN, TIBC, IRON, RETICCTPCT in the last 72 hours. Sepsis Labs: No results for input(s): PROCALCITON, LATICACIDVEN in the last 168 hours.  Recent Results (from the past 240 hour(s))  Respiratory Panel by RT PCR (Flu A&B, Covid) - Nasopharyngeal Swab     Status: None   Collection Time: 02/21/20  4:53 AM   Specimen: Nasopharyngeal Swab  Result Value Ref Range Status   SARS Coronavirus 2 by RT PCR NEGATIVE NEGATIVE Final    Comment: (NOTE) SARS-CoV-2 target nucleic acids are NOT DETECTED.  The SARS-CoV-2 RNA is generally detectable in upper respiratoy specimens during the acute phase of infection. The lowest concentration of SARS-CoV-2 viral copies this assay can detect is 131 copies/mL. A negative result does not preclude SARS-Cov-2 infection and should not  be used as the sole basis for treatment or other patient management decisions. A negative result may occur with  improper specimen collection/handling, submission of specimen other than nasopharyngeal swab, presence of viral mutation(s) within the areas targeted by this assay, and inadequate number of viral copies (<131 copies/mL). A negative result must be combined with clinical observations, patient history, and epidemiological information. The expected result is Negative.  Fact Sheet for Patients:  https://www.moore.com/  Fact Sheet for Healthcare Providers:  https://www.young.biz/  This test is no t yet approved or cleared by the [place] FDA and  has been authorized for detection and/or diagnosis of SARS-CoV-2 by FDA under an Emergency Use Authorization (EUA). This EUA will remain  in effect (meaning this test can be used) for the duration of the COVID-19 declaration under Section 564(b)(1) of the Act, 21 U.S.C. section 360bbb-3(b)(1), unless the authorization is terminated or revoked sooner.     Influenza A by PCR NEGATIVE NEGATIVE Final   Influenza B by PCR NEGATIVE NEGATIVE Final    Comment: (NOTE) The Xpert Xpress SARS-CoV-2/FLU/RSV assay is intended as an aid in  the diagnosis of influenza from Nasopharyngeal swab specimens and  should not be used as a sole basis for treatment. Nasal washings and  aspirates are unacceptable for Xpert Xpress SARS-CoV-2/FLU/RSV  testing.  Fact Sheet for Patients: https://www.moore.com/  Fact Sheet for Healthcare Providers: https://www.young.biz/  This test is not yet approved or cleared by the [place] FDA and  has been authorized for detection and/or diagnosis of SARS-CoV-2 by  FDA under an Emergency Use Authorization (EUA). This EUA will remain  in effect (meaning this test can be used) for the duration of the  Covid-19 declaration under Section  564(b)(1) of the Act, 21  U.S.C. section 360bbb-3(b)(1), unless the authorization is  terminated or revoked. Performed at [facility], [address] [place], [place] [address]   Culture, Urine     Status: Abnormal   Collection Time: 02/21/20  7:06 AM  Specimen: Urine, Catheterized  Result Value Ref Range Status   Specimen Description   Final    URINE, CATHETERIZED Performed at [facility], [address] [place], [place] [address]    Special Requests   Final    NONE Performed at [facility], [address] [place], [place] [address]    Culture MULTIPLE SPECIES PRESENT, SUGGEST RECOLLECTION (A)  Final   Report Status 02/23/2020 FINAL  Final     Radiology Studies: No results found.  Scheduled Meds: . amLODipine  10 mg Oral Daily  . aspirin  81 mg Oral Daily  . busPIRone  15 mg Oral BID  . docusate sodium  100 mg Oral BID  . enoxaparin (LOVENOX) injection  40 mg Subcutaneous Q24H  . ezetimibe  10 mg Oral Daily  . latanoprost  1 drop Both Eyes QHS  . levothyroxine  100 mcg Oral QAC breakfast  . polyethylene glycol  17 g Oral Daily  . QUEtiapine  25 mg Oral QHS   Continuous Infusions: . sodium chloride Stopped (02/26/20 1428)  . methocarbamol (ROBAXIN) IV       LOS: 5 days   Rickey Barbara, MD [facility] Pager On Amion  If 7PM-7AM, please contact night-coverage 02/26/2020, 4:56 PM

## 2020-02-26 NOTE — TOC Progression Note (Signed)
Transition of Care [facility]) - Progression Note    Patient Details  Name: [name] [name] MRN: [id number] Date of Birth: 18-Jun-1932  Transition of Care [facility]) CM/SW Contact  Armanda Heritage, RN Phone Number: 02/26/2020, 1:33 PM  Clinical Narrative:    CM spoke with patient's son Marijean Niemann who reports he is going to tour facilities before he can make a choice.  Son wants to make sure that the facility is able to manage and assist patient due to her history of dementia.  Son reports he anticipates having a selection by late morning tomorrow.  TOC will continue to follow.     Expected Discharge Plan: Skilled Nursing Facility Barriers to Discharge: Continued Medical Work up  Expected Discharge Plan and Services Expected Discharge Plan: Skilled Nursing Facility   Discharge Planning Services: CM Consult Post Acute Care Choice: Skilled Nursing Facility Living arrangements for the past 2 months: Single Family Home                                       Social Determinants of Health (SDOH) Interventions    Readmission Risk Interventions No flowsheet data found.

## 2020-02-26 NOTE — TOC Progression Note (Signed)
Transition of Care [facility]) - Progression Note    Patient Details  Name: [name] [name] MRN: [id number] Date of Birth: [birth year]  Transition of Care [facility]) CM/SW Contact  Armanda Heritage, RN Phone Number: 02/26/2020, 10:08 AM  Clinical Narrative:    Cm spoke with patient's daughter at bedside and presented bed offers.  Daughter to speak with son and wishes to contact facilities to confirm visitation policy prior to making final decision.  CM will follow-up for facility choice.  [employer] authorization initiated [facility] Harvey ID# [id number]), clinical faxed.      Expected Discharge Plan: Skilled Nursing Facility Barriers to Discharge: Continued Medical Work up  Expected Discharge Plan and Services Expected Discharge Plan: Skilled Nursing Facility   Discharge Planning Services: CM Consult Post Acute Care Choice: Skilled Nursing Facility Living arrangements for the past 2 months: Single Family Home                                       Social Determinants of Health (SDOH) Interventions    Readmission Risk Interventions No flowsheet data found.

## 2020-02-27 LAB — COMPREHENSIVE METABOLIC PANEL
ALT: 12 U/L (ref 0–44)
AST: 16 U/L (ref 15–41)
Albumin: 3.4 g/dL — ABNORMAL LOW (ref 3.5–5.0)
Alkaline Phosphatase: 35 U/L — ABNORMAL LOW (ref 38–126)
Anion gap: 8 (ref 5–15)
BUN: 14 mg/dL (ref 8–23)
CO2: 22 mmol/L (ref 22–32)
Calcium: 9.2 mg/dL (ref 8.9–10.3)
Chloride: 109 mmol/L (ref 98–111)
Creatinine, Ser: 0.76 mg/dL (ref 0.44–1.00)
GFR, Estimated: >60 mL/min (ref >60)
Glucose, Bld: 102 mg/dL — ABNORMAL HIGH (ref 70–99)
Potassium: 3.5 mmol/L (ref 3.5–5.1)
Sodium: 139 mmol/L (ref 135–145)
Total Bilirubin: 0.9 mg/dL (ref 0.3–1.2)
Total Protein: 6.7 g/dL (ref 6.5–8.1)

## 2020-02-27 MED ORDER — POLYETHYLENE GLYCOL 3350 17 G PO PACK: 17.0000 g | PACK | Freq: Every day | ORAL | 0 refills | Status: AC

## 2020-02-27 MED ORDER — QUETIAPINE FUMARATE 25 MG PO TABS: 25.0000 mg | ORAL_TABLET | Freq: Every day | ORAL | 0 refills | Status: DC

## 2020-02-27 NOTE — TOC Transition Note (Signed)
Transition of Care [facility]) - CM/SW Discharge Note   Patient Details  Name: [name] [name] MRN: [id number] Date of Birth: 1932/12/02  Transition [facility] Care [facility]) CM/SW Contact:  Lennart Pall, LCSW Phone Number: 02/27/2020, 2:17 PM   Clinical Narrative:    Met with pt/ daughter/son this morning and they have confirmed they would now like to have pt dc'd home and they will share in providing 24/7 assistance to patient.  Reviewed DME and HH f/u needs and referrals have been made (see below).  No further TOC needs.   Final next level of care: [place] Barriers to Discharge: Barriers Resolved   Patient Goals and CMS Choice Patient states their goals for this hospitalization and ongoing recovery are:: to go to rehab per daughter CMS Medicare.gov Compare Post Acute Care list provided to:: Patient Represenative (must comment) Choice offered to / list presented to : Adult Children  Discharge Placement                       Discharge Plan and Services   Discharge Planning Services: CM Consult Post Acute Care Choice: [place]          DME Arranged: 3-N-1, [place] rolling DME Agency: [employer] Date DME Agency Contacted: 02/27/20 Time DME Agency Contacted: 33   HH Arranged: RN, PT, OT, Nurse's Aide, Social Work [employer] Agency: [place] Date [place]: 02/27/20 Time [place]: 34 Representative spoke with at [place]: [place] ([place]) Interventions     Readmission Risk Interventions No flowsheet data found.

## 2020-02-27 NOTE — Discharge Summary (Addendum)
Discharge Summary  [name] [name] KCL:[id number] DOB: [birth year]  PCP: [name], NP  Admit date: 02/21/2020 Discharge date: 02/27/2020  Time spent: 35 minutes  Recommendations for Outpatient Follow-up:  1. Follow-up with orthopedic surgery 2. Follow-up with your primary care provider 3. Take your medications as prescribed 4. Continue PT OT with assistance and fall precautions.  Discharge Diagnoses:  Active Hospital Problems   Diagnosis Date Noted  . Hip fracture (HCC) 02/21/2020    Resolved Hospital Problems  No resolved problems to display.    Discharge Condition: Stable  Diet recommendation: Resume previous diet.  Vitals:   02/27/20 0615 02/27/20 0900  BP: (!) 145/81 119/69  Pulse: 68 74  Resp: 20 18  Temp: 98.1 F (36.7 C) 97.9 F (36.6 C)  SpO2: 97% 99%    History of present illness:  86 y.o.femalewith medical history significant fordementia. Hx per daughter at bedside. She reports that the patient has become more confused over the past week or so. She is normally has intermittent periods of lucidness, but that has not happened over the past week. She has had more hallucinations (seeing people that aren't there) and has been more agitated. The morning of her presentation she woke up and went to her living room around 2:20 AM. Her daughter heard a crash and her mother call out for help. She found her mother on the floor on her right side and could not get up. She called for her brother's assistance to get her up. They called for EMS to transport to the ED.  ED Course:The patient was found to have a R hip fracture. Orthopedics was consulted. [facility] was called for admission   POD #6 post right hip hemiarthroplasty, anterior approach by Dr. Linna Caprice on 02/21/2020.  Orthopedic surgery provided the following recommendations: WBAT with walker DVT ppx: Aspirin, SCDs, TEDS.      Hospital Course:  Active Problems:   Hip fracture (HCC)  R hip fracture  post repair from unwitnessed Fall at home Subcapital fracture of the right femoral neck - Orthopedic Surgery -Pt now s/p surgery 02/21/20 - PT/OT consulted. Recommendations for 24hr supervision/SNF  -Daughter has declined SNF and requested discharge to home with home health services.  Acute blood loss anemia post orthopedic surgery Hg stable  Last Hg 10.0 from 9.9, baseline Hg 12. No overt bleeding post operatively. Follow up with PCP  Hx of dementia with report of increasing confusion -Prior to admission was on namenda, buspar, xanax -Recommend follow-up with primary care provider to review medications. -Namenda has been on hold due to dizziness as a possible side effect. -Completed empiric tx for UTI  -Pt noted to be occasionally confused and agitated.  -Have changed trazodone to nightly seroquel with good results at night, per family -Continue Seroquel nightly -Follow-up with your PCP within a week.  Chronic anxiety She is on Buspar and xanax If interested on coming off Xanax, please do not stop abruptly and have PCP arrange taper.  Hypothyroidism - continue levothyroxine as pt tolerates -TSH of 0.51  HTN -BP is stable - continue norvasc -Follow up with your PCP  HLD - continue zetia   Likely UTI - UA is concerning for UTI in the setting of reported increased confusion leading up to admission - Urine cx is inconclusive  - Completed empiric treatment with rocephin  Syncope, vasovagal vs orthostatic -New event during this encounter -Recently, patient had syncope while moving bowels, associated with diaphoresis -Rapid response called. Pt noted to be bradycardic following  event -Pt's son at bedside reports episodes of near syncope when standing from a sitting position in the setting of poor PO intake of fluids -Orthostatic hypotension confirmed from sitting to standing per BP -Received IV fluid. -Continue fall precautions,  patient declines SNF placement.  Code Status: Full Family Communication:  Updated her daughter at bedside.  All questions answered to the best of my ability.       Consultants:   Orthopedic Surgery  Procedures:   R hip hemiarthroplasty 10/21  Antimicrobials:             Anti-infectives (From admission, onward)       Start        Dose/Rate  Route  Frequency  Ordered  Stop     02/22/20 1800    cefTRIAXone (ROCEPHIN) 1 g in sodium chloride 0.9 % 100 mL IVPB          1 g  200 mL/hr over 30 Minutes  Intravenous  Every 24 hours  02/22/20 1644  02/23/20 1736     02/21/20 1800    ceFAZolin (ANCEF) IVPB 2g/100 mL premix          2 g  200 mL/hr over 30 Minutes  Intravenous  Every 6 hours  02/21/20 1539  02/22/20 0000     02/21/20 1600    cefTRIAXone (ROCEPHIN) 1 g in sodium chloride 0.9 % 100 mL IVPB  Status:  Discontinued          1 g  200 mL/hr over 30 Minutes  Intravenous  Every 24 hours  02/21/20 1050  02/22/20 1504     02/21/20 1004    ceFAZolin (ANCEF) 2-4 GM/100ML-% IVPB        Note to Pharmacy: Linard Millers   : cabinet override           02/21/20 1004  02/21/20 1633         Discharge Exam: BP 119/69 (BP Location: Right Arm)   Pulse 74   Temp 97.9 F (36.6 C) (Oral)   Resp 18   Ht 4\' 11"  (1.499 m)   Wt 66.7 kg   LMP  (LMP Unknown)   SpO2 99%   BMI 29.69 kg/m  . General: 86 y.o. year-old female well developed well nourished in no acute distress.  Alert and pleasant. . Cardiovascular: Regular rate and rhythm with no rubs or gallops.  No thyromegaly or JVD noted.   83 Respiratory: Clear to auscultation with no wheezes or rales. Good inspiratory effort. . Abdomen: Soft nontender nondistended with normal bowel sounds x4 quadrants. . Musculoskeletal: No lower extremity edema bilaterally. Marland Kitchen Psychiatry: Mood is appropriate for condition and setting  Discharge Instructions You were cared for by a  hospitalist during your hospital stay. If you have any questions about your discharge medications or the care you received while you were in the hospital after you are discharged, you can call the unit and asked to speak with the hospitalist on call if the hospitalist that took care of you is not available. Once you are discharged, your primary care physician will handle any further medical issues. Please note that NO REFILLS for any discharge medications will be authorized once you are discharged, as it is imperative that you return to your primary care physician (or establish a relationship with a primary care physician if you do not have one) for your aftercare needs so that they can reassess your need for medications and monitor your lab values.   Allergies  as of 02/27/2020   No Known Allergies     Medication List    STOP taking these medications   memantine 10 MG tablet Commonly known as: NAMENDA   traZODone 100 MG tablet Commonly known as: DESYREL     TAKE these medications   acetaminophen 500 MG tablet Commonly known as: TYLENOL Take 1,000 mg by mouth every 6 (six) hours as needed (pain).   ALPRAZolam 0.25 MG tablet Commonly known as: XANAX Take 1 tablet (0.25 mg total) by mouth 2 (two) times daily as needed for anxiety.   amLODipine 10 MG tablet Commonly known as: NORVASC Take 10 mg by mouth daily.   aspirin 81 MG chewable tablet Chew 1 tablet (81 mg total) by mouth 2 (two) times daily with a meal. What changed: when to take this   busPIRone 30 MG tablet Commonly known as: BUSPAR Take 15 mg by mouth 2 (two) times daily.   Calcium-Magnesium-Vitamin D 600-40-500 MG-MG-UNIT Tb24 Take 1 tablet by mouth daily.   ezetimibe 10 MG tablet Commonly known as: ZETIA Take 10 mg by mouth daily.   HYDROcodone-acetaminophen 5-325 MG tablet Commonly known as: NORCO/VICODIN Take 1-2 tablets by mouth every 4 (four) hours as needed for moderate pain (pain score 4-6).    latanoprost 0.005 % ophthalmic solution Commonly known as: XALATAN Place 1 drop into both eyes at bedtime.   levothyroxine 100 MCG tablet Commonly known as: SYNTHROID TAKE 1 TABLET BY MOUTH DAILY BEFORE BREAKFAST. What changed: See the new instructions.   polyethylene glycol 17 g packet Commonly known as: MIRALAX / GLYCOLAX Take 17 g by mouth daily. Start taking on: February 28, 2020   QUEtiapine 25 MG tablet Commonly known as: SEROQUEL Take 1 tablet (25 mg total) by mouth at bedtime.            Durable Medical Equipment  (From admission, onward)         Start     Ordered   02/27/20 1219  For home use only DME 3 n 1  Once        02/27/20 1218   02/27/20 1218  For home use only DME Walker rolling  Once       Question Answer Comment  Walker: With 5 Inch Wheels   Patient needs a walker to treat with the following condition Ambulatory dysfunction      02/27/20 1218   02/27/20 1016  For home use only DME Shower stool  Once        02/27/20 1016         No Known Allergies  Follow-up Information    Swinteck, Arlys John, MD. Schedule an appointment as soon as possible for a visit in 2 weeks.   Specialty: Orthopedic Surgery Why: For wound re-check Contact information: [address] [place] [address] [phone]        [name], NP. Call in 1 day(s).   Specialty: Geriatric Medicine Why: Please call for a post hospital follow-up appointment. Contact information: [address] [place] [address] [phone]        [name], MD .   Specialty: Cardiology Contact information: [phone] [place] [address] [phone]                The results of significant diagnostics from this hospitalization (including imaging, microbiology, ancillary and laboratory) are listed below for reference.    Significant Diagnostic Studies: DG Chest 1 View  Result Date:  02/21/2020 CLINICAL DATA:   Preoperative chest.  Fall. EXAM: CHEST  1 VIEW COMPARISON:  01/13/2018 FINDINGS: Postoperative changes in the mediastinum. Shallow inspiration. Diffuse cardiac enlargement. Mild central vascular congestion. No edema or consolidation. No pleural effusions. No pneumothorax. Tortuous aorta. IMPRESSION: Cardiac enlargement with mild central vascular congestion. Electronically Signed   By: Burman Nieves M.D.   On: 02/21/2020 04:24   CT Head Wo Contrast  Result Date: 02/21/2020 CLINICAL DATA:  Mental status change with unknown cause. Fall at home EXAM: CT HEAD WITHOUT CONTRAST CT CERVICAL SPINE WITHOUT CONTRAST TECHNIQUE: Multidetector CT imaging of the head and cervical spine was performed following the standard protocol without intravenous contrast. Multiplanar CT image reconstructions of the cervical spine were also generated. COMPARISON:  None. FINDINGS: CT HEAD FINDINGS Brain: No evidence of acute infarction, hemorrhage, hydrocephalus, extra-axial collection or mass lesion/mass effect. Atrophy that is most notable in the bilateral temporal lobes, correlating with dementia history. Moderate for age chronic small vessel ischemia in the white matter. Vascular: No hyperdense vessel or unexpected calcification. Skull: Negative for fracture Sinuses/Orbits: No evidence of injury. Bilateral cataract resection. Partial bilateral mastoid opacification with negative nasopharynx. CT CERVICAL SPINE FINDINGS Alignment: No traumatic malalignment Skull base and vertebrae: No acute fracture or incidental bone lesion. Incidental T3-4 non segmentation. Soft tissues and spinal canal: No prevertebral fluid or swelling. No visible canal hematoma. Disc levels: Ordinary degenerative changes. No visible cord impingement Upper chest: Negative IMPRESSION: 1. No evidence of intracranial or cervical spine injury. 2. Temporal predominant brain atrophy in keeping with history of dementia. Electronically Signed   By: Marnee Spring M.D.    On: 02/21/2020 04:37   CT Cervical Spine Wo Contrast  Result Date: 02/21/2020 CLINICAL DATA:  Mental status change with unknown cause. Fall at home EXAM: CT HEAD WITHOUT CONTRAST CT CERVICAL SPINE WITHOUT CONTRAST TECHNIQUE: Multidetector CT imaging of the head and cervical spine was performed following the standard protocol without intravenous contrast. Multiplanar CT image reconstructions of the cervical spine were also generated. COMPARISON:  None. FINDINGS: CT HEAD FINDINGS Brain: No evidence of acute infarction, hemorrhage, hydrocephalus, extra-axial collection or mass lesion/mass effect. Atrophy that is most notable in the bilateral temporal lobes, correlating with dementia history. Moderate for age chronic small vessel ischemia in the white matter. Vascular: No hyperdense vessel or unexpected calcification. Skull: Negative for fracture Sinuses/Orbits: No evidence of injury. Bilateral cataract resection. Partial bilateral mastoid opacification with negative nasopharynx. CT CERVICAL SPINE FINDINGS Alignment: No traumatic malalignment Skull base and vertebrae: No acute fracture or incidental bone lesion. Incidental T3-4 non segmentation. Soft tissues and spinal canal: No prevertebral fluid or swelling. No visible canal hematoma. Disc levels: Ordinary degenerative changes. No visible cord impingement Upper chest: Negative IMPRESSION: 1. No evidence of intracranial or cervical spine injury. 2. Temporal predominant brain atrophy in keeping with history of dementia. Electronically Signed   By: Marnee Spring M.D.   On: 02/21/2020 04:37   Pelvis Portable  Result Date: 02/21/2020 CLINICAL DATA:  Status post right hip replacement. EXAM: PORTABLE PELVIS 1-2 VIEWS COMPARISON:  Right hip radiographs 02/21/2020 FINDINGS: Sequelae of interval right hip hemiarthroplasty are identified. The femoral prosthesis is approximated with the acetabulum on this single image. No acute fracture is identified. Postoperative  gas is noted in the surrounding soft tissues. Coarse calcification in the pelvis is suggestive of uterine fibroid. IMPRESSION: Interval right hip hemiarthroplasty. Electronically Signed   By: Sebastian Ache M.D.   On: 02/21/2020 14:28   DG C-Arm 1-60  Min-No Report  Result Date: 02/21/2020 Fluoroscopy was utilized by the requesting physician.  No radiographic interpretation.   DG HIP OPERATIVE UNILAT WITH PELVIS RIGHT  Result Date: 02/21/2020 CLINICAL DATA:  Right hip surgery. EXAM: OPERATIVE RIGHT HIP (WITH PELVIS IF PERFORMED) TECHNIQUE: Fluoroscopic spot image(s) were submitted for interpretation post-operatively. COMPARISON:  Right hip radiographs 02/21/2020 FINDINGS: Five intraoperative spot fluoroscopic images are provided during right hip hemiarthroplasty. The femoral prosthesis is approximated with the acetabulum on these limited images. IMPRESSION: Intraoperative images during right hip hemiarthroplasty. Electronically Signed   By: Sebastian AcheAllen  Grady M.D.   On: 02/21/2020 14:27   DG Hip Unilat W or Wo Pelvis 2-3 Views Right  Result Date: 02/21/2020 CLINICAL DATA:  Right hip pain after a fall today EXAM: DG HIP (WITH OR WITHOUT PELVIS) 2-3V RIGHT COMPARISON:  None. FINDINGS: Transverse subcapital fracture of the right femoral neck with varus angulation. No dislocation at the hip joint. Pelvis appears intact. SI joints and symphysis pubis are not displaced. Calcification in the pelvis likely represents a calcified uterine fibroid. IMPRESSION: Transverse subcapital fracture of the right femoral neck with varus angulation. Electronically Signed   By: Burman NievesWilliam  Stevens M.D.   On: 02/21/2020 04:23    Microbiology: Recent Results (from the past 240 hour(s))  Respiratory Panel by RT PCR (Flu A&B, Covid) - Nasopharyngeal Swab     Status: None   Collection Time: 02/21/20  4:53 AM   Specimen: Nasopharyngeal Swab  Result Value Ref Range Status   SARS Coronavirus 2 by RT PCR NEGATIVE NEGATIVE Final     Comment: (NOTE) SARS-CoV-2 target nucleic acids are NOT DETECTED.  The SARS-CoV-2 RNA is generally detectable in upper respiratoy specimens during the acute phase of infection. The lowest concentration of SARS-CoV-2 viral copies this assay can detect is 131 copies/mL. A negative result does not preclude SARS-Cov-2 infection and should not be used as the sole basis for treatment or other patient management decisions. A negative result may occur with  improper specimen collection/handling, submission of specimen other than nasopharyngeal swab, presence of viral mutation(s) within the areas targeted by this assay, and inadequate number of viral copies (<131 copies/mL). A negative result must be combined with clinical observations, patient history, and epidemiological information. The expected result is Negative.  Fact Sheet for Patients:  https://www.moore.com/https://www.fda.gov/media/142436/download  Fact Sheet for Healthcare Providers:  https://www.young.biz/https://www.fda.gov/media/142435/download  This test is no t yet approved or cleared by the [place] FDA and  has been authorized for detection and/or diagnosis of SARS-CoV-2 by FDA under an Emergency Use Authorization (EUA). This EUA will remain  in effect (meaning this test can be used) for the duration of the COVID-19 declaration under Section 564(b)(1) of the Act, 21 U.S.C. section 360bbb-3(b)(1), unless the authorization is terminated or revoked sooner.     Influenza A by PCR NEGATIVE NEGATIVE Final   Influenza B by PCR NEGATIVE NEGATIVE Final    Comment: (NOTE) The Xpert Xpress SARS-CoV-2/FLU/RSV assay is intended as an aid in  the diagnosis of influenza from Nasopharyngeal swab specimens and  should not be used as a sole basis for treatment. Nasal washings and  aspirates are unacceptable for Xpert Xpress SARS-CoV-2/FLU/RSV  testing.  Fact Sheet for Patients: https://www.moore.com/https://www.fda.gov/media/142436/download  Fact Sheet for Healthcare  Providers: https://www.young.biz/https://www.fda.gov/media/142435/download  This test is not yet approved or cleared by the [place] FDA and  has been authorized for detection and/or diagnosis of SARS-CoV-2 by  FDA under an Emergency Use Authorization (EUA). This EUA will remain  in  effect (meaning this test can be used) for the duration of the  Covid-19 declaration under Section 564(b)(1) of the Act, 21  U.S.C. section 360bbb-3(b)(1), unless the authorization is  terminated or revoked. Performed at [facility], [address] [place], [place] [address]   Culture, Urine     Status: Abnormal   Collection Time: 02/21/20  7:06 AM   Specimen: Urine, Catheterized  Result Value Ref Range Status   Specimen Description   Final    URINE, CATHETERIZED Performed at [facility], [address] [place], [place] [address]    Special Requests   Final    NONE Performed at [facility], [address] [place], [place] [address]    Culture MULTIPLE SPECIES PRESENT, SUGGEST RECOLLECTION (A)  Final   Report Status 02/23/2020 FINAL  Final     Labs: Basic Metabolic Panel: Recent Labs  Lab 02/23/20 0311 02/24/20 0258 02/25/20 0337 02/26/20 0823 02/27/20 0300  NA 140 138 141 143 139  K 3.6 3.5 3.4* 3.7 3.5  CL 105 102 109 112* 109  CO2 GLUCOSE 93 101* 91 103* 102*  BUN 15 25* CREATININE 0.77 1.12* 0.76 0.62 0.76  CALCIUM 9.3 9.1 8.7* 9.1 9.2   Liver Function Tests: Recent Labs  Lab 02/22/20 0316 02/24/20 0258 02/25/20 0337 02/26/20 0823 02/27/20 0300  AST 14* 16  ALT ALKPHOS 27* 31* 29* 31* 35*  BILITOT 0.4 0.9 0.9 0.8 0.9  PROT 5.8* 6.1* 5.7* 6.1* 6.7  ALBUMIN 3.3* 3.1* 3.0* 3.1* 3.4*   No results for input(s): LIPASE, AMYLASE in the last 168 hours. No results for input(s): AMMONIA in the last 168 hours. CBC: Recent Labs  Lab 02/21/20 0404 02/22/20 0316 02/23/20 0311  02/24/20 0258 02/25/20 0337  WBC 8.2 10.8* 9.9 7.1 6.6  NEUTROABS 6.1  --   --   --   --   HGB 12.9 10.0* 10.9* 9.9* 10.0*  HCT 39.5 30.3* 33.2* 29.7* 30.5*  MCV 91.0 90.7 91.0 90.0 91.9  PLT 196 142* 162 148* 166   Cardiac Enzymes: No results for input(s): CKTOTAL, CKMB, CKMBINDEX, TROPONINI in the last 168 hours. BNP: BNP (last 3 results) No results for input(s): BNP in the last 8760 hours.  ProBNP (last 3 results) No results for input(s): PROBNP in the last 8760 hours.  CBG: Recent Labs  Lab 02/23/20 1337 02/24/20 1139  GLUCAP 104* 102*       Signed:  Darlin Drop, MD [facility] 02/27/2020, 1:09 PM

## 2020-02-28 ENCOUNTER — Telehealth: Payer: Self-pay | Admitting: *Deleted

## 2020-02-28 NOTE — Telephone Encounter (Signed)
Transition Care Management Follow-Up Telephone Call   Date discharged and where: 02/27/2020 [place]  How have you been since you were released from the hospital? Anxious  Any patient concerns? No  Items Reviewed:   Meds: Yes  Allergies:Yes  Dietary Changes Reviewed:Yes  Functional Questionnaire:  Independent-I Dependent-D  ADLs:I with assistance. Home health ordered   Dressing- I with assistance    Eating-I   Maintaining continence-I   Transferring-I with assistance. walker   Transportation-D   Meal Prep-D   Managing Meds- I with assistance  Confirmed importance and Date/Time of follow-up visits scheduled:Patient son did not schedule an appointment. Stated he doesn't understand why she has to come here and to the specialist to follow up. Explained. He stated that he would have his sister call to schedule an appointment later.    Confirmed with patient if condition worsens to call PCP or go to the Emergency Dept. Patient was given office number and encouraged to call back with questions or concerns: Yes

## 2020-03-03 ENCOUNTER — Telehealth: Payer: Self-pay | Admitting: *Deleted

## 2020-03-03 NOTE — Telephone Encounter (Signed)
[name], daughter called and stated that patient has not been sleeping since released from Hospital from Hip Surgery (has an appointment 03/21/20 with [name] to follow up) daughter thinks it is because of her dementia and being off schedule  Daughter gave her 0.25mg  of Xanax yesterday at 11:00am and then again at 6:00 pm and stated patient fell asleep around 7:00 and has been sleeping ever since. Stated that she woke up this morning and went to the bathroom but went back to bed.   Home Health came in today but patient was not able to do any PT due to sleeping. Nurse checked her vitals and they were all normal.   Patient has not had anything to eat or drink today except this morning she did wake up long enough to take some Tylenol for her hip pain.   Daughter is just wanting to let you know and wants to know if she should be concerned. Stated that she is probably catching up on the 3 day sleep she missed.   Please Advise.  (forwarded to Santiago Bumpers out of office)

## 2020-03-03 NOTE — Telephone Encounter (Signed)
Noted  

## 2020-03-03 NOTE — Telephone Encounter (Signed)
Home health Nurse to draw lab work CBC/diff,BMP to rule out acute abnormalities and follow up Hospitalization.

## 2020-03-03 NOTE — Telephone Encounter (Signed)
Called and spoke with [facility] with [facility] and verbal order given.   Called and notified daughter, She stated that patient is now awake.

## 2020-03-05 ENCOUNTER — Telehealth: Payer: Self-pay

## 2020-03-05 NOTE — Telephone Encounter (Signed)
Angel from [facility] [phone]) is requesting an order for a wheelchair for the patient.

## 2020-03-05 NOTE — Telephone Encounter (Signed)
She will need to schedule appointment with Hollace Kinnier for a face to face either at the office or video for a visit then she can order a wheelchair.

## 2020-03-06 ENCOUNTER — Other Ambulatory Visit: Payer: Self-pay | Admitting: Family

## 2020-03-06 DIAGNOSIS — E039 Hypothyroidism, unspecified: Secondary | ICD-10-CM

## 2020-03-06 NOTE — Telephone Encounter (Signed)
Noted  

## 2020-03-06 NOTE — Telephone Encounter (Signed)
Called daughter to tell her that we needed a face-to-face visit before we can order a wheelchair. Stated we could do an in-office visit or we could do a MyChart visit.  She stated her brother was the one requesting a wheelchair and she would tell him to call and set up a MyChart visit.

## 2020-03-07 NOTE — Telephone Encounter (Signed)
Son called and a MyChart visit was scheduled for 11/8. I let Angel at [facility] know this also. She asked if I would fax order to them or our DME provider of choice. I told her we usually send to [employer] and she stated they also used them for their DME.

## 2020-03-10 ENCOUNTER — Encounter: Payer: Medicare PPO | Admitting: Nurse Practitioner

## 2020-03-10 ENCOUNTER — Other Ambulatory Visit: Payer: Self-pay

## 2020-03-10 ENCOUNTER — Telehealth: Payer: Self-pay

## 2020-03-10 ENCOUNTER — Encounter: Payer: Self-pay | Admitting: Nurse Practitioner

## 2020-03-10 NOTE — Progress Notes (Signed)
   This service is provided via telemedicine  No vital signs collected/recorded due to the encounter was a telemedicine visit.   Location of patient (ex: home, work):  Home  Patient consents to a telephone visit: Yes, see telephone encounter dated 03/10/2020 with annual consent   Location of the provider (ex: office, home):  [facility], Office   Name of any referring provider:  n/a  Names of all persons participating in the telemedicine service and their role in the encounter: S.Chrae B/CMA, Abbey Chatters, NP, Daughter [name]), and Patient   Time spent on call:  10 min with medical assistant  This encounter was created in error - please disregard.

## 2020-03-10 NOTE — Progress Notes (Deleted)
Careteam: Patient Care Team: [name], NP as PCP - General (Geriatric Medicine) [name], MD as PCP - Cardiology (Cardiology)  Advanced Directive information Does Patient Have a Medical Advance Directive?: No;Yes, Type of Advance Directive: Healthcare Power of Attorney, Does patient want to make changes to medical advance directive?: No - Patient declined  No Known Allergies  Chief Complaint  Patient presents with  . Acute Visit    Face-to-Face for wheelchair. Telephone/video visit.      HPI: Patient is a 86 y.o. female for follow up hospitalization and family requesting   Review of Systems:  ROS***  Past Medical History:  Diagnosis Date  . Carotid stenosis    a. Carotid US (10/15):  Bilateral ICA 1-39%  . Coronary artery disease    a. NSTEMI >> LHC (02/11/14):  pLAD 95%, mLAD occl, CFX < 20%, pRCA 30%, EF 35%, ant and apical AK >> CABG  . Dementia (HCC)   . Glucose intolerance (impaired glucose tolerance)    a. A1c 6.1 (01/2014)  . H/O non-ST elevation myocardial infarction (NSTEMI)    01/2014  . HLD (hyperlipidemia)   . Hypertension   . Ischemic cardiomyopathy    a. EF 35% by cath at time of NSTEMI >> b. Echo (10/15):  EF 50% to 55%. Wall motion was normal; Grade 1 diastolic dysfunction.  Mild AI.  Ascending aorta mildly dilated. Mild MR.  Mild LAE.  PA peak pressure: 46 mm Hg (S).   Past Surgical History:  Procedure Laterality Date  . CESAREAN SECTION    . CORONARY ARTERY BYPASS GRAFT N/A 02/15/2014   Procedure: CORONARY ARTERY BYPASS GRAFTING (CABG) x three,  using left internal mammary artery and right leg greater saphenous vein harvested endoscopically;  Surgeon: Alleen Borne, MD;  Location: [facility] OR;  Service: Open Heart Surgery;  Laterality: N/A;  . HIP ARTHROPLASTY Right 02/21/2020   Procedure: HEMIARTHROPLASTY ANTERIOR APPROACH;  Surgeon: Samson Frederic, MD;  Location: [facility] ORS;  Service: Orthopedics;  Laterality: Right;  . LEFT HEART  CATHETERIZATION WITH CORONARY ANGIOGRAM N/A 02/11/2014   Procedure: LEFT HEART CATHETERIZATION WITH CORONARY ANGIOGRAM;  Surgeon: Peter M Swaziland, MD;  Location: [facility] CATH LAB;  Service: Cardiovascular;  Laterality: N/A;  . TEE WITHOUT CARDIOVERSION N/A 02/15/2014   Procedure: TRANSESOPHAGEAL ECHOCARDIOGRAM (TEE);  Surgeon: Alleen Borne, MD;  Location: [facility] OR;  Service: Open Heart Surgery;  Laterality: N/A;   Social History:   reports that she has never smoked. She has never used smokeless tobacco. She reports that she does not drink alcohol and does not use drugs.  Family History  Problem Relation Age of Onset  . Stroke Mother   . Hypertension Mother   . Hypertension Sister   . Diabetes Brother   . Diabetes Sister   . Heart attack Neg Hx   . Breast cancer Neg Hx     Medications: Patient's Medications  New Prescriptions   No medications on file  Previous Medications   ACETAMINOPHEN (TYLENOL) 500 MG TABLET    Take 1,000 mg by mouth every 6 (six) hours as needed (pain).   ALPRAZOLAM (XANAX) 0.25 MG TABLET    Take 1 tablet (0.25 mg total) by mouth 2 (two) times daily as needed for anxiety.   AMLODIPINE (NORVASC) 10 MG TABLET    Take 10 mg by mouth daily.   ASPIRIN 81 MG CHEWABLE TABLET    Chew 1 tablet (81 mg total) by mouth 2 (two) times daily with a  meal.   BUSPIRONE (BUSPAR) 30 MG TABLET    Take 15 mg by mouth 4 (four) times daily.    CALCIUM-MAGNESIUM-VITAMIN D 600-40-500 MG-MG-UNIT TB24    Take 1 tablet by mouth daily.   EZETIMIBE (ZETIA) 10 MG TABLET    Take 10 mg by mouth daily.   LATANOPROST (XALATAN) 0.005 % OPHTHALMIC SOLUTION    Place 1 drop into the left eye at bedtime.    LEVOTHYROXINE (SYNTHROID) 100 MCG TABLET    TAKE 1 TABLET BY MOUTH DAILY BEFORE BREAKFAST.   POLYETHYLENE GLYCOL (MIRALAX / GLYCOLAX) 17 G PACKET    Take 17 g by mouth daily.   QUETIAPINE (SEROQUEL) 25 MG TABLET    Take 1 tablet (25 mg total) by mouth at bedtime.  Modified Medications   No medications on  file  Discontinued Medications   HYDROCODONE-ACETAMINOPHEN (NORCO/VICODIN) 5-325 MG TABLET    Take 1-2 tablets by mouth every 4 (four) hours as needed for moderate pain (pain score 4-6).    Physical Exam:  There were no vitals filed for this visit. There is no height or weight on file to calculate BMI. Wt Readings from Last 3 Encounters:  02/21/20 147 lb (66.7 kg)  02/15/20 139 lb (63 kg)  11/23/19 143 lb 12.8 oz (65.2 kg)    Physical Exam***  Labs reviewed: Basic Metabolic Panel: Recent Labs    07/16/19 1052 09/11/19 1008 09/21/19 1519 02/15/20 1136 02/21/20 0404 02/25/20 0337 02/26/20 0823 02/27/20 0300  NA  --   --    < > 142   < > 141 143 139  K  --   --    < > 4.3   < > 3.4* 3.7 3.5  CL  --   --    < > 107   < > 109 112* 109  CO2  --   --    < > 28   < > 22 24 22   GLUCOSE  --   --    < > 83   < > 91 103* 102*  BUN  --   --    < > 10   < > 23 12 14   CREATININE  --   --    < > 0.94*   < > 0.76 0.62 0.76  CALCIUM  --   --    < > 10.2   < > 8.7* 9.1 9.2  TSH 36.76* 8.45*  --  0.51  --   --   --   --    < > = values in this interval not displayed.   Liver Function Tests: Recent Labs    02/25/20 0337 02/26/20 0823 02/27/20 0300  AST 16 14* 16  ALT 11 10 12   ALKPHOS 29* 31* 35*  BILITOT 0.9 0.8 0.9  PROT 5.7* 6.1* 6.7  ALBUMIN 3.0* 3.1* 3.4*   No results for input(s): LIPASE, AMYLASE in the last 8760 hours. No results for input(s): AMMONIA in the last 8760 hours. CBC: Recent Labs    09/21/19 1519 09/21/19 1519 02/15/20 1136 02/15/20 1136 02/21/20 0404 02/22/20 0316 02/23/20 0311 02/24/20 0258 02/25/20 0337  WBC 6.4   < > 4.6   < > 8.2   < > 9.9 7.1 6.6  NEUTROABS 3,661  --  2,714  --  6.1  --   --   --   --   HGB 12.3   < > 12.9   < > 12.9   < > 10.9* 9.9*  10.0*  HCT 38.3   < > 39.4   < > 39.5   < > 33.2* 29.7* 30.5*  MCV 89.3   < > 89.7   < > 91.0   < > 91.0 90.0 91.9  PLT 221   < > 236   < > 196   < > 162 148* 166   < > = values in this  interval not displayed.   Lipid Panel: Recent Labs    05/14/19 0000 02/15/20 1136  CHOL 200 177  HDL 85* 67  LDLCALC  --  96  TRIG 54 49  CHOLHDL  --  2.6   TSH: Recent Labs    07/16/19 1052 09/11/19 1008 02/15/20 1136  TSH 36.76* 8.45* 0.51   A1C: Lab Results  Component Value Date   HGBA1C 6.1 (H) 02/11/2014     Assessment/Plan There are no diagnoses linked to this encounter.  Next appt: *** Zarin Hagmann K. Biagio Borg  [facility] [phone]    Virtual Visit via ***  I connected with patient on 03/10/20 at 11:00 AM EST by *** and verified that I am speaking with the correct person using two identifiers.  Location: Patient: *** Provider: ***   I discussed the limitations, risks, security and privacy concerns of performing an evaluation and management service by telephone and the availability of in person appointments. I also discussed with the patient that there may be a patient responsible charge related to this service. The patient expressed understanding and agreed to proceed.   I discussed the assessment and treatment plan with the patient. The patient was provided an opportunity to ask questions and all were answered. The patient agreed with the plan and demonstrated an understanding of the instructions.   The patient was advised to call back or seek an in-person evaluation if the symptoms worsen or if the condition fails to improve as anticipated.  I provided *** minutes of non-face-to-face time during this encounter.  Janene Harvey. Biagio Borg Avs printed and mailed

## 2020-03-10 NOTE — Telephone Encounter (Signed)
Ms. eileene, kisling are scheduled for a virtual visit with your provider today.    Just as we do with appointments in the office, we must obtain your consent to participate.  Your consent will be active for this visit and any virtual visit you may have with one of our providers in the next 365 days.    If you have a MyChart account, I can also send a copy of this consent to you electronically.  All virtual visits are billed to your insurance company just like a traditional visit in the office.  As this is a virtual visit, video technology does not allow for your provider to perform a traditional examination.  This may limit your provider's ability to fully assess your condition.  If your provider identifies any concerns that need to be evaluated in person or the need to arrange testing such as labs, EKG, etc, we will make arrangements to do so.    Although advances in technology are sophisticated, we cannot ensure that it will always work on either your end or our end.  If the connection with a video visit is poor, we may have to switch to a telephone visit.  With either a video or telephone visit, we are not always able to ensure that we have a secure connection.   I need to obtain your verbal consent now.   Are you willing to proceed with your visit today?   Christyanna Mckeon (daughter) has provided verbal consent on 03/10/2020 for a virtual visit (video or telephone).   Edison Simon [place], [place] 03/10/2020  10:58 AM

## 2020-03-11 ENCOUNTER — Other Ambulatory Visit: Payer: Medicare PPO

## 2020-03-12 ENCOUNTER — Other Ambulatory Visit: Payer: Self-pay | Admitting: Nurse Practitioner

## 2020-03-12 NOTE — Telephone Encounter (Signed)
Pharmacy requested refill. Pended Rx and sent to [facility] for approval.  No contract on File.  Has a MyChart Visit tomorrow.      OV NOTE DATED 02/15/2020: 3. Generalized anxiety disorder Stable but does have some anxiety, she is on buspar BID and alprazolam, will attempt dose reduction on alprazolam at this time. To xanax 0.25 mg twice daily, if does well on this for 2 weeks can reduced to daily and then to use just as needed.

## 2020-03-13 ENCOUNTER — Telehealth (INDEPENDENT_AMBULATORY_CARE_PROVIDER_SITE_OTHER): Payer: Medicare PPO | Admitting: Nurse Practitioner

## 2020-03-13 ENCOUNTER — Other Ambulatory Visit: Payer: Self-pay

## 2020-03-13 DIAGNOSIS — G4700 Insomnia, unspecified: Secondary | ICD-10-CM

## 2020-03-13 DIAGNOSIS — S72001D Fracture of unspecified part of neck of right femur, subsequent encounter for closed fracture with routine healing: Secondary | ICD-10-CM

## 2020-03-13 DIAGNOSIS — R5381 Other malaise: Secondary | ICD-10-CM

## 2020-03-13 DIAGNOSIS — F0391 Unspecified dementia with behavioral disturbance: Secondary | ICD-10-CM

## 2020-03-13 DIAGNOSIS — F411 Generalized anxiety disorder: Secondary | ICD-10-CM

## 2020-03-13 DIAGNOSIS — K5904 Chronic idiopathic constipation: Secondary | ICD-10-CM

## 2020-03-13 MED ORDER — TRAZODONE HCL 50 MG PO TABS: 50.0000 mg | ORAL_TABLET | Freq: Every day | ORAL | 3 refills | Status: DC

## 2020-03-13 MED ORDER — ALPRAZOLAM 0.25 MG PO TABS: 0.2500 mg | ORAL_TABLET | Freq: Two times a day (BID) | ORAL | 0 refills | Status: DC | PRN

## 2020-03-13 NOTE — Progress Notes (Signed)
Careteam: Patient Care Team: [name], NP as PCP - General (Geriatric Medicine) [name], MD as PCP - Cardiology (Cardiology)  Advanced Directive information    No Known Allergies  Chief Complaint  Patient presents with   Acute Visit    Patient is requesting order for wheelchair.[name], patient's daughter will participate in visit today. Patient is requesting order for wheelchair because she recently had hip replacement due to fall a couple of weeks ago. Patient has been getting around on walker a little bit. Daughter states patient is worn out during the day because of trying to walk around house all day on walker. Orthopedist recommended that she get a small wheelchair to use around the home as well as the physical therapist and nurse     HPI: Patient is a 86 y.o. female to follow up hospitalization.  Pt was found on the floor by her daughter and she was unable to get her up therefore called EMS and was transferred to hospital for further evaluation, found to have right hip fracture and orthopedics was consulted. She is now s/p right hip hemiarthroplasty, anterior approach by Dr. Linna Caprice on 02/21/2020. She is back at home and doing well. Pain is well controlled with tylenol.   Went to see orthopedic doctor earlier this week. Orthopedic requested her to get a smaller wheelchair.  She is working with PT. Daughter reports she is worn out in the evening and hard to get her in the bed. Can not walk the hall late in the day. Also needing wheelchair if they have to leave the house.   Managing pain with tylenol 500 mg. Has not needed the oxycodone.  She had issues with constipation in the hospital but then she had incontinence so they had to hold medication. Now using half dose every other day which has been doing well.    Daughter reports she has cut the xanax back using half dose  She has 0.5 mg tablet and using half tablet.  Most of the time she is using daily but  sometimes needs twice daily.  Does not remember the fall and gets anxious when she remembers something is going on.   Has trazodone which did not help, she did not use for long but willing to restart to minimize xanax.  Has trazodone 100 mg tablets.   Using seroquel around 6 pm, relaxing pt but does not allow her to get in a deep sleep.   Continues on buspar 15 mg 4 times daily.   Review of Systems:  Review of Systems  Constitutional: Negative for chills and fever.  Respiratory: Negative for shortness of breath.   Cardiovascular: Negative for chest pain and palpitations.  Musculoskeletal: Positive for joint pain and myalgias. Negative for back pain.  Neurological: Negative for dizziness and headaches.  Psychiatric/Behavioral: The patient is nervous/anxious and has insomnia.     Past Medical History:  Diagnosis Date   Carotid stenosis    a. Carotid US (10/15):  Bilateral ICA 1-39%   Coronary artery disease    a. NSTEMI >> LHC (02/11/14):  pLAD 95%, mLAD occl, CFX < 20%, pRCA 30%, EF 35%, ant and apical AK >> CABG   Dementia (HCC)    Glucose intolerance (impaired glucose tolerance)    a. A1c 6.1 (01/2014)   H/O non-ST elevation myocardial infarction (NSTEMI)    01/2014   HLD (hyperlipidemia)    Hypertension    Ischemic cardiomyopathy    a. EF 35% by cath  at time of NSTEMI >> b. Echo (10/15):  EF 50% to 55%. Wall motion was normal; Grade 1 diastolic dysfunction.  Mild AI.  Ascending aorta mildly dilated. Mild MR.  Mild LAE.  PA peak pressure: 46 mm Hg (S).   Past Surgical History:  Procedure Laterality Date   CESAREAN SECTION     CORONARY ARTERY BYPASS GRAFT N/A 02/15/2014   Procedure: CORONARY ARTERY BYPASS GRAFTING (CABG) x three,  using left internal mammary artery and right leg greater saphenous vein harvested endoscopically;  Surgeon: Alleen BorneBryan K Bartle, MD;  Location: [facility] OR;  Service: Open Heart Surgery;  Laterality: N/A;   HIP ARTHROPLASTY Right 02/21/2020    Procedure: HEMIARTHROPLASTY ANTERIOR APPROACH;  Surgeon: Samson FredericSwinteck, Brian, MD;  Location: [facility] ORS;  Service: Orthopedics;  Laterality: Right;   LEFT HEART CATHETERIZATION WITH CORONARY ANGIOGRAM N/A 02/11/2014   Procedure: LEFT HEART CATHETERIZATION WITH CORONARY ANGIOGRAM;  Surgeon: Peter M SwazilandJordan, MD;  [facility];  Service: Cardiovascular;  Laterality: N/A;   TEE WITHOUT CARDIOVERSION N/A 02/15/2014   Procedure: TRANSESOPHAGEAL ECHOCARDIOGRAM (TEE);  Surgeon: Alleen BorneBryan K Bartle, MD;  [facility];  Service: Open Heart Surgery;  Laterality: N/A;   Social History:   reports that she has never smoked. She has never used smokeless tobacco. She reports that she does not drink alcohol and does not use drugs.  Family History  Problem Relation Age of Onset   Stroke Mother    Hypertension Mother    Hypertension Sister    Diabetes Brother    Diabetes Sister    Heart attack Neg Hx    Breast cancer Neg Hx     Medications: Patient's Medications  New Prescriptions   No medications on file  Previous Medications   ACETAMINOPHEN (TYLENOL) 500 MG TABLET    Take 1,000 mg by mouth every 6 (six) hours as needed (pain).   ALPRAZOLAM (XANAX) 0.25 MG TABLET    Take 1 tablet (0.25 mg total) by mouth 2 (two) times daily as needed for anxiety.   AMLODIPINE (NORVASC) 10 MG TABLET    Take 10 mg by mouth daily.   ASPIRIN 81 MG CHEWABLE TABLET    Chew 1 tablet (81 mg total) by mouth 2 (two) times daily with a meal.   BUSPIRONE (BUSPAR) 30 MG TABLET    Take 15 mg by mouth 4 (four) times daily.    CALCIUM-MAGNESIUM-VITAMIN D 600-40-500 MG-MG-UNIT TB24    Take 1 tablet by mouth daily.   EZETIMIBE (ZETIA) 10 MG TABLET    Take 10 mg by mouth daily.   LATANOPROST (XALATAN) 0.005 % OPHTHALMIC SOLUTION    Place 1 drop into the left eye at bedtime.    LEVOTHYROXINE (SYNTHROID) 100 MCG TABLET    TAKE 1 TABLET BY MOUTH DAILY BEFORE BREAKFAST.   POLYETHYLENE GLYCOL (MIRALAX / GLYCOLAX) 17 G PACKET    Take  17 g by mouth daily.   QUETIAPINE (SEROQUEL) 25 MG TABLET    Take 1 tablet (25 mg total) by mouth at bedtime.  Modified Medications   No medications on file  Discontinued Medications   No medications on file    Physical Exam:  There were no vitals filed for this visit. There is no height or weight on file to calculate BMI. Wt Readings from Last 3 Encounters:  02/21/20 147 lb (66.7 kg)  02/15/20 139 lb (63 kg)  11/23/19 143 lb 12.8 oz (65.2 kg)      Labs reviewed: Basic Metabolic Panel: Recent Labs  07/16/19 1052 09/11/19 1008 09/21/19 1519 02/15/20 1136 02/21/20 0404 02/25/20 0337 02/26/20 0823 02/27/20 0300  NA  --   --    < > 142   < > 141 143 139  K  --   --    < > 4.3   < > 3.4* 3.7 3.5  CL  --   --    < > 107   < > 109 112* 109  CO2  --   --    < > 28   < > 22 24 22   GLUCOSE  --   --    < > 83   < > 91 103* 102*  BUN  --   --    < > 10   < > 23 12 14   CREATININE  --   --    < > 0.94*   < > 0.76 0.62 0.76  CALCIUM  --   --    < > 10.2   < > 8.7* 9.1 9.2  TSH 36.76* 8.45*  --  0.51  --   --   --   --    < > = values in this interval not displayed.   Liver Function Tests: Recent Labs    02/25/20 0337 02/26/20 0823 02/27/20 0300  AST 16 14* 16  ALT 11 10 12   ALKPHOS 29* 31* 35*  BILITOT 0.9 0.8 0.9  PROT 5.7* 6.1* 6.7  ALBUMIN 3.0* 3.1* 3.4*   No results for input(s): LIPASE, AMYLASE in the last 8760 hours. No results for input(s): AMMONIA in the last 8760 hours. CBC: Recent Labs    09/21/19 1519 09/21/19 1519 02/15/20 1136 02/15/20 1136 02/21/20 0404 02/22/20 0316 02/23/20 0311 02/24/20 0258 02/25/20 0337  WBC 6.4   < > 4.6   < > 8.2   < > 9.9 7.1 6.6  NEUTROABS 3,661  --  2,714  --  6.1  --   --   --   --   HGB 12.3   < > 12.9   < > 12.9   < > 10.9* 9.9* 10.0*  HCT 38.3   < > 39.4   < > 39.5   < > 33.2* 29.7* 30.5*  MCV 89.3   < > 89.7   < > 91.0   < > 91.0 90.0 91.9  PLT 221   < > 236   < > 196   < > 162 148* 166   < > = values in this  interval not displayed.   Lipid Panel: Recent Labs    05/14/19 0000 02/15/20 1136  CHOL 200 177  HDL 85* 67  LDLCALC  --  96  TRIG 54 49  CHOLHDL  --  2.6   TSH: Recent Labs    07/16/19 1052 09/11/19 1008 02/15/20 1136  TSH 36.76* 8.45* 0.51   A1C: Lab Results  Component Value Date   HGBA1C 6.1 (H) 02/11/2014     Assessment/Plan 1. Generalized anxiety disorder -ongoing but controlled, continues buspar 15 mg QID which has worked well for her. Has reduced xanax and now using 0.25 mg by mouth twice daily  As needed.  - traZODone (DESYREL) 50 MG tablet; Take 1 tablet (50 mg total) by mouth at bedtime.  Dispense: 30 tablet; Refill: 3 - ALPRAZolam (XANAX) 0.25 MG tablet; Take 1 tablet (0.25 mg total) by mouth 2 (two) times daily as needed for anxiety.  Dispense: 30 tablet; Refill: 0  2. Insomnia, unspecified type -  ongoing, will restart trazodone at 50 mg daily and take routinely every night at bedtime. Melatonin had opposite effect when she tried so stopped using. Encouraged good nighttime routine. - traZODone (DESYREL) 50 MG tablet; Take 1 tablet (50 mg total) by mouth at bedtime.  Dispense: 30 tablet; Refill: 3  3. Dementia with behavioral disturbance, unspecified dementia type (HCC) Stable, lives with daughter. Having some worsening anxiety due to immobility however no increase in behaviors or agitation. Will stop seroquel at this time as daughter does not feel like it is providing overwhelming benefit.  Continues on namenda 10 mg by mouth twice daily.   4. Closed fracture of right hip with routine healing, subsequent encounter Followed up with ortho and per daughter doing well. PT working with her at home. She is participating and doing well but by the evening fatigued and unable to walk the hallways to get back to her room therefore requesting wheelchair.  - DME Wheelchair manual  5. Chronic idiopathic constipation Doing well on mirlax 1/2 dose every other day.   6.  Debility -working with pt/ot for strength and gait training.  - DME Wheelchair manual  Next appt: 05/22/2019 Janene Harvey. Biagio Borg  [facility] [phone]    Virtual Visit via Earleen Reaper  I connected with patient on 03/13/20 at 11:30 AM EST by video and verified that I am speaking with the correct person using two identifiers.  Location: Patient: home Provider: [facility]   I discussed the limitations, risks, security and privacy concerns of performing an evaluation and management service by telephone and the availability of in person appointments. I also discussed with the patient that there may be a patient responsible charge related to this service. The patient expressed understanding and agreed to proceed.   I discussed the assessment and treatment plan with the patient. The patient was provided an opportunity to ask questions and all were answered. The patient agreed with the plan and demonstrated an understanding of the instructions.   The patient was advised to call back or seek an in-person evaluation if the symptoms worsen or if the condition fails to improve as anticipated.  I provided 25 minutes of non-face-to-face time during this encounter.  Janene Harvey. Biagio Borg Avs printed and mailed

## 2020-03-13 NOTE — Progress Notes (Signed)
This service is provided via telemedicine  No vital signs collected/recorded due to the encounter was a telemedicine visit.   Location of patient (ex: home, work):  Home  Patient consents to a telephone visit:  Yes, see encounter dated 03/10/2020  Location of the provider (ex: office, home):  [facility]  Name of any referring provider:  N/A  Names of all persons participating in the telemedicine service and their role in the encounter:  Abbey Chatters, Nurse Practitioner, Elveria Royals, CMA, and patient.   Time spent on call:  9 minutes with medical assistant

## 2020-03-17 ENCOUNTER — Telehealth: Payer: Self-pay | Admitting: *Deleted

## 2020-03-17 NOTE — Telephone Encounter (Signed)
I printed Form and should have been in Evies box to send.  Okay to get UA C&S

## 2020-03-17 NOTE — Telephone Encounter (Signed)
Angel with [facility] called requesting verbal orders.  Stated that she spoke with son, Asher Muir and daughter over the weekend and he stated that patient is not sleeping good and has had increase confusion and behavior is alittle off. Nurse is wanting to get and order to do a U/A and Cx  Also, Nurse is wanting to know where you sent the DME Order for the Wheelchair. Stated that the family had a TeleVisit on Thursday.   Please Advise.

## 2020-03-17 NOTE — Telephone Encounter (Signed)
Angel notified and agreed.   Evie, would you given Lawanna Kobus, with [facility] a call and let her know where the order was sent for the Wheelchair.

## 2020-03-18 ENCOUNTER — Ambulatory Visit: Payer: Medicare PPO | Admitting: Family

## 2020-03-18 NOTE — Telephone Encounter (Signed)
Angel from [facility] called [phone]) to ask about the wheelchair order. I sent Evie a staff message to ask her where order was sent, as I don't see it noted in Epic and Lawanna Kobus stated she had not heard from anyone.

## 2020-03-19 ENCOUNTER — Ambulatory Visit: Payer: Medicare PPO | Admitting: Nurse Practitioner

## 2020-03-19 NOTE — Telephone Encounter (Addendum)
Order was faxed to [facility] and sent for scanning. I also tried calling Waka with [facility], but I got her vm. I left a message.

## 2020-03-19 NOTE — Telephone Encounter (Signed)
Order faxed to [facility] [phone].

## 2020-03-21 ENCOUNTER — Ambulatory Visit: Payer: Self-pay | Admitting: Nurse Practitioner

## 2020-03-25 ENCOUNTER — Other Ambulatory Visit: Payer: Self-pay | Admitting: Nurse Practitioner

## 2020-03-25 DIAGNOSIS — F411 Generalized anxiety disorder: Secondary | ICD-10-CM

## 2020-04-24 ENCOUNTER — Other Ambulatory Visit: Payer: Self-pay | Admitting: Nurse Practitioner

## 2020-04-24 DIAGNOSIS — F411 Generalized anxiety disorder: Secondary | ICD-10-CM

## 2020-04-24 NOTE — Telephone Encounter (Signed)
No contract on file Last OV (video visit) 03/13/20 Last labs 02/27/20 [facility]) Last refill 03/26/20

## 2020-05-21 ENCOUNTER — Ambulatory Visit (INDEPENDENT_AMBULATORY_CARE_PROVIDER_SITE_OTHER): Payer: Medicare PPO | Admitting: Nurse Practitioner

## 2020-05-21 ENCOUNTER — Other Ambulatory Visit: Payer: Self-pay

## 2020-05-21 VITALS — BP 126/72 | HR 57 | Temp 96.9°F | Ht 59.0 in | Wt 144.8 lb

## 2020-05-21 DIAGNOSIS — K5904 Chronic idiopathic constipation: Secondary | ICD-10-CM

## 2020-05-21 DIAGNOSIS — I10 Essential (primary) hypertension: Secondary | ICD-10-CM | POA: Diagnosis not present

## 2020-05-21 DIAGNOSIS — G4700 Insomnia, unspecified: Secondary | ICD-10-CM

## 2020-05-21 DIAGNOSIS — E782 Mixed hyperlipidemia: Secondary | ICD-10-CM

## 2020-05-21 DIAGNOSIS — F0391 Unspecified dementia with behavioral disturbance: Secondary | ICD-10-CM

## 2020-05-21 DIAGNOSIS — K1379 Other lesions of oral mucosa: Secondary | ICD-10-CM

## 2020-05-21 DIAGNOSIS — E039 Hypothyroidism, unspecified: Secondary | ICD-10-CM

## 2020-05-21 DIAGNOSIS — R8290 Unspecified abnormal findings in urine: Secondary | ICD-10-CM

## 2020-05-21 DIAGNOSIS — F411 Generalized anxiety disorder: Secondary | ICD-10-CM

## 2020-05-21 MED ORDER — LEVOTHYROXINE SODIUM 100 MCG PO TABS: ORAL_TABLET | ORAL | 1 refills | Status: DC

## 2020-05-21 NOTE — Progress Notes (Signed)
Careteam: Patient Care Team: [name], NP as PCP - General (Geriatric Medicine) [name], MD as PCP - Cardiology (Cardiology)  PLACE OF SERVICE:  [facility]  Advanced Directive information Does Patient Have a Medical Advance Directive?: Yes, Type of Advance Directive: Healthcare Power of [place];Living will, Does patient want to make changes to medical advance directive?: No - Patient declined  No Known Allergies  Chief Complaint  Patient presents with  . Medical Management of Chronic Issues    3 month follow-up. Discuss current medical diagnosis, discuss if a neurology referral is needed to see if progression is happening and what stage patient is in. Trazodone keeps patient awake (has reverse effect)      HPI: Patient is a 87 y.o. female for routine follow up.  Pt lives with her daughter [name], who is on the phone during the visit today.   Insomnia- stopped trazodone because it has a negative effect on her. Last night sleep was light.  Most of the time sleep is good.  Getting to sleep is sometimes an issue.   Anxiety- always present, can tell when it is time for medication. Taking buspar 1 tablet 4 times daily - can not tolerate 30 mg at a time. "in another world"  Hypothyroid- continues on synthroid 100 mcg   Dementia- could not tolerate namenda.  Daughter assist her with bathing and cleaning.  She is able to feed herself. Sometimes family helps her. Family had to take clothes out of room and they assist with dressing herself. Walking around without difficulty. She is pacing the halls.   Urinary frequency- drinks a lot of water and goes to the bathroom frequently. Uses adult diaper.   Review of Systems:  Review of Systems  Constitutional: Negative for chills, fever and weight loss.  HENT: Negative for tinnitus.   Respiratory: Negative for cough, sputum production and shortness of breath.   Cardiovascular: Negative for chest pain, palpitations and  leg swelling.  Gastrointestinal: Negative for abdominal pain, constipation, diarrhea and heartburn.  Genitourinary: Negative for dysuria, frequency and urgency.  Musculoskeletal: Negative for back pain, falls, joint pain and myalgias.  Skin: Negative.   Neurological: Negative for dizziness and headaches.  Psychiatric/Behavioral: Positive for memory loss. Negative for depression. The patient is nervous/anxious and has insomnia.     Past Medical History:  Diagnosis Date  . Carotid stenosis    a. Carotid US (10/15):  Bilateral ICA 1-39%  . Coronary artery disease    a. NSTEMI >> LHC (02/11/14):  pLAD 95%, mLAD occl, CFX < 20%, pRCA 30%, EF 35%, ant and apical AK >> CABG  . Dementia (HCC)   . Glucose intolerance (impaired glucose tolerance)    a. A1c 6.1 (01/2014)  . H/O non-ST elevation myocardial infarction (NSTEMI)    01/2014  . HLD (hyperlipidemia)   . Hypertension   . Ischemic cardiomyopathy    a. EF 35% by cath at time of NSTEMI >> b. Echo (10/15):  EF 50% to 55%. Wall motion was normal; Grade 1 diastolic dysfunction.  Mild AI.  Ascending aorta mildly dilated. Mild MR.  Mild LAE.  PA peak pressure: 46 mm Hg (S).   Past Surgical History:  Procedure Laterality Date  . CESAREAN SECTION    . CORONARY ARTERY BYPASS GRAFT N/A 02/15/2014   Procedure: CORONARY ARTERY BYPASS GRAFTING (CABG) x three,  using left internal mammary artery and right leg greater saphenous vein harvested endoscopically;  Surgeon: Alleen Borne, MD;  Location: [facility] OR;  Service: Open Heart Surgery;  Laterality: N/A;  . HIP ARTHROPLASTY Right 02/21/2020   Procedure: HEMIARTHROPLASTY ANTERIOR APPROACH;  Surgeon: Samson Frederic, MD;  Location: [facility] ORS;  Service: Orthopedics;  Laterality: Right;  . LEFT HEART CATHETERIZATION WITH CORONARY ANGIOGRAM N/A 02/11/2014   Procedure: LEFT HEART CATHETERIZATION WITH CORONARY ANGIOGRAM;  Surgeon: Peter M Swaziland, MD;  Location: [facility] CATH LAB;  Service: Cardiovascular;  Laterality:  N/A;  . TEE WITHOUT CARDIOVERSION N/A 02/15/2014   Procedure: TRANSESOPHAGEAL ECHOCARDIOGRAM (TEE);  Surgeon: Alleen Borne, MD;  Location: [facility] OR;  Service: Open Heart Surgery;  Laterality: N/A;   Social History:   reports that she has never smoked. She has never used smokeless tobacco. She reports that she does not drink alcohol and does not use drugs.  Family History  Problem Relation Age of Onset  . Stroke Mother   . Hypertension Mother   . Hypertension Sister   . Diabetes Brother   . Diabetes Sister   . Heart attack Neg Hx   . Breast cancer Neg Hx     Medications: Patient's Medications  New Prescriptions   No medications on file  Previous Medications   ACETAMINOPHEN (TYLENOL) 500 MG TABLET    Take 1,000 mg by mouth every 6 (six) hours as needed (pain).   ALPRAZOLAM (XANAX) 0.25 MG TABLET    TAKE 1 TABLET BY MOUTH TWICE A DAY AS NEEDED FOR ANXIETY   AMLODIPINE (NORVASC) 10 MG TABLET    Take 10 mg by mouth daily.   BUSPIRONE (BUSPAR) 30 MG TABLET    Take 15 mg by mouth 4 (four) times daily.    CALCIUM-MAGNESIUM-VITAMIN D 600-40-500 MG-MG-UNIT TB24    Take 1 tablet by mouth daily.   CHOLECALCIFEROL (VITAMIN D3) 50 MCG (2000 UT) TABS    Take 1 capsule by mouth daily.   EZETIMIBE (ZETIA) 10 MG TABLET    Take 10 mg by mouth daily.   LATANOPROST (XALATAN) 0.005 % OPHTHALMIC SOLUTION    Place 1 drop into the left eye at bedtime.    LEVOTHYROXINE (SYNTHROID) 100 MCG TABLET    TAKE 1 TABLET BY MOUTH DAILY BEFORE BREAKFAST.   POLYETHYLENE GLYCOL (MIRALAX / GLYCOLAX) 17 G PACKET    Take 17 g by mouth daily.  Modified Medications   No medications on file  Discontinued Medications   TRAZODONE (DESYREL) 50 MG TABLET    Take 1 tablet (50 mg total) by mouth at bedtime.    Physical Exam:  Vitals:   05/21/20 1143  BP: 126/72  Pulse: (!) 57  Temp: (!) 96.9 F (36.1 C)  TempSrc: Temporal  SpO2: 98%  Weight: 144 lb 12.8 oz (65.7 kg)  Height: 4\' 11"  (1.499 m)   There is no height or  weight on file to calculate BMI. Wt Readings from Last 3 Encounters:  02/21/20 147 lb (66.7 kg)  02/15/20 139 lb (63 kg)  11/23/19 143 lb 12.8 oz (65.2 kg)    Physical Exam Constitutional:      General: She is not in acute distress.    Appearance: She is well-developed and well-nourished. She is not diaphoretic.  HENT:     Head: Normocephalic and atraumatic.     Nose: Nose normal. No congestion or rhinorrhea.     Mouth/Throat:     Mouth: Oropharynx is clear and moist.     Pharynx: No oropharyngeal exudate.      Comments: Small abrasion noted to back of mouth Eyes:  Conjunctiva/sclera: Conjunctivae normal.     Pupils: Pupils are equal, round, and reactive to light.  Cardiovascular:     Rate and Rhythm: Normal rate and regular rhythm.     Heart sounds: Normal heart sounds.  Pulmonary:     Effort: Pulmonary effort is normal.     Breath sounds: Normal breath sounds.  Abdominal:     General: Bowel sounds are normal.     Palpations: Abdomen is soft.  Musculoskeletal:        General: No tenderness or edema.     Cervical back: Normal range of motion and neck supple.  Skin:    General: Skin is warm and dry.  Neurological:     Mental Status: She is alert and oriented to person, place, and time.  Psychiatric:        Mood and Affect: Mood and affect normal.     Labs reviewed: Basic Metabolic Panel: Recent Labs    07/16/19 1052 09/11/19 1008 09/21/19 1519 02/15/20 1136 02/21/20 0404 02/25/20 0337 02/26/20 0823 02/27/20 0300  NA  --   --    < > 142   < > 141 143 139  K  --   --    < > 4.3   < > 3.4* 3.7 3.5  CL  --   --    < > 107   < > 109 112* 109  CO2  --   --    < > 28   < > 22 24 22   GLUCOSE  --   --    < > 83   < > 91 103* 102*  BUN  --   --    < > 10   < > 23 12 14   CREATININE  --   --    < > 0.94*   < > 0.76 0.62 0.76  CALCIUM  --   --    < > 10.2   < > 8.7* 9.1 9.2  TSH 36.76* 8.45*  --  0.51  --   --   --   --    < > = values in this interval not  displayed.   Liver Function Tests: Recent Labs    02/25/20 0337 02/26/20 0823 02/27/20 0300  AST 16 14* 16  ALT 11 10 12   ALKPHOS 29* 31* 35*  BILITOT 0.9 0.8 0.9  PROT 5.7* 6.1* 6.7  ALBUMIN 3.0* 3.1* 3.4*   No results for input(s): LIPASE, AMYLASE in the last 8760 hours. No results for input(s): AMMONIA in the last 8760 hours. CBC: Recent Labs    09/21/19 1519 02/15/20 1136 02/21/20 0404 02/22/20 0316 02/23/20 0311 02/24/20 0258 02/25/20 0337  WBC 6.4 4.6 8.2   < > 9.9 7.1 6.6  NEUTROABS 3,661 2,714 6.1  --   --   --   --   HGB 12.3 12.9 12.9   < > 10.9* 9.9* 10.0*  HCT 38.3 39.4 39.5   < > 33.2* 29.7* 30.5*  MCV 89.3 89.7 91.0   < > 91.0 90.0 91.9  PLT 221 236 196   < > 162 148* 166   < > = values in this interval not displayed.   Lipid Panel: Recent Labs    02/15/20 1136  CHOL 177  HDL 67  LDLCALC 96  TRIG 49  CHOLHDL 2.6   TSH: Recent Labs    07/16/19 1052 09/11/19 1008 02/15/20 1136  TSH 36.76* 8.45* 0.51   A1C: Lab Results  Component Value Date   HGBA1C 6.1 (H) 02/11/2014     Assessment/Plan 1. Generalized anxiety disorder -stable continues on buspar QID and alprazolam qhs.   2. Acquired hypothyroidism - levothyroxine (SYNTHROID) 100 MCG tablet; TAKE 1 TABLET BY MOUTH DAILY BEFORE BREAKFAST.  Dispense: 90 tablet; Refill: 1 - TSH  3. Chronic idiopathic constipation -controlled on miralax every other day  4. Essential hypertension -controlled with lifestyle modifications.  5. Mixed hyperlipidemia -on zetia, not fasting today but will follow up labs.  - COMPLETE METABOLIC PANEL WITH GFR - Lipid Panel  6. Dementia with behavioral disturbance, unspecified dementia type (HCC) Advanced dementia, living with daughter and family providing care. Last CT scan was in October 2021 consistent with dementa. Unable to tolerate namenda. Continues supportive care with family   7. Insomnia, unspecified type Some nights worse than others.  Trazodone was not effective.   8. Cloudy urine -unable to collect urine in office. No painful urination or abdominal pain. Will monitor. - CBC with Differential/Platelet - COMPLETE METABOLIC PANEL WITH GFR  9. Mouth sore Noted abrasion to back of mouth below dentures on left. Pt not complaining of pain. Son reports they will monitor at home and if does not resolve in 1 week to bring back in for further evaluation- ? If caused by dentures, son reports she sometimes puts them in wrong which could have caused rubbing to the back of the mouth.  Next appt: 4 months, sooner if needed Johnie Stadel K. Biagio Borg  [facility] [phone]

## 2020-05-21 NOTE — Patient Instructions (Signed)
Monitor for increase confusion, painful urination, incontinence.  Blood work obtained today

## 2020-05-22 LAB — COMPLETE METABOLIC PANEL WITH GFR
AG Ratio: 1.1 (calc) (ref 1.0–2.5)
ALT: 8 U/L (ref 6–29)
AST: 13 U/L (ref 10–35)
Albumin: 4.2 g/dL (ref 3.6–5.1)
Alkaline phosphatase (APISO): 59 U/L (ref 37–153)
BUN: 12 mg/dL (ref 7–25)
CO2: 25 mmol/L (ref 20–32)
Calcium: 10.2 mg/dL (ref 8.6–10.4)
Chloride: 106 mmol/L (ref 98–110)
Creat: 0.88 mg/dL (ref 0.60–0.88)
GFR, Est African American: 69 mL/min/{1.73_m2} (ref >=60)
GFR, Est Non African American: 59 mL/min/{1.73_m2} — ABNORMAL LOW (ref >=60)
Globulin: 3.9 g/dL (calc) — ABNORMAL HIGH (ref 1.9–3.7)
Glucose, Bld: 95 mg/dL (ref 65–139)
Potassium: 4.1 mmol/L (ref 3.5–5.3)
Sodium: 140 mmol/L (ref 135–146)
Total Bilirubin: 0.3 mg/dL (ref 0.2–1.2)
Total Protein: 8.1 g/dL (ref 6.1–8.1)

## 2020-05-22 LAB — CBC WITH DIFFERENTIAL/PLATELET
Absolute Monocytes: 422 cells/uL (ref 200–950)
Basophils Absolute: 50 cells/uL (ref 0–200)
Basophils Relative: 0.8 %
Eosinophils Absolute: 99 cells/uL (ref 15–500)
Eosinophils Relative: 1.6 %
HCT: 37.2 % (ref 35.0–45.0)
Hemoglobin: 12.3 g/dL (ref 11.7–15.5)
Lymphs Abs: 1934 cells/uL (ref 850–3900)
MCH: 28.9 pg (ref 27.0–33.0)
MCHC: 33.1 g/dL (ref 32.0–36.0)
MCV: 87.3 fL (ref 80.0–100.0)
MPV: 8.7 fL (ref 7.5–12.5)
Monocytes Relative: 6.8 %
Neutro Abs: 3695 cells/uL (ref 1500–7800)
Neutrophils Relative %: 59.6 %
Platelets: 283 10*3/uL (ref 140–400)
RBC: 4.26 10*6/uL (ref 3.80–5.10)
RDW: 13.1 % (ref 11.0–15.0)
Total Lymphocyte: 31.2 %
WBC: 6.2 10*3/uL (ref 3.8–10.8)

## 2020-05-22 LAB — LIPID PANEL
Cholesterol: 181 mg/dL (ref <200)
HDL: 75 mg/dL (ref >=50)
LDL Cholesterol (Calc): 86 mg/dL (calc)
Non-HDL Cholesterol (Calc): 106 mg/dL (calc) (ref <130)
Total CHOL/HDL Ratio: 2.4 (calc) (ref <5.0)
Triglycerides: 105 mg/dL (ref <150)

## 2020-05-22 LAB — TSH: TSH: 2.83 mIU/L (ref 0.40–4.50)

## 2020-05-25 ENCOUNTER — Other Ambulatory Visit: Payer: Self-pay | Admitting: Family

## 2020-05-25 DIAGNOSIS — F411 Generalized anxiety disorder: Secondary | ICD-10-CM

## 2020-05-26 NOTE — Telephone Encounter (Signed)
E-scribe is back up. No treatment agreement on file. Per verbal conversation with [name], NP ok to mail treatment agreement to patient.   I called and spoke with Asher Muir, patients son and informed him that we will send treatment agreement via mail. Asher Muir to review and return, Asher Muir was instructed to call if he has any questions. Address on file confirmed.   RX sent to [name], NP for approval, last filled on 04/24/2021

## 2020-06-13 ENCOUNTER — Other Ambulatory Visit: Payer: Self-pay

## 2020-06-13 ENCOUNTER — Ambulatory Visit: Payer: Self-pay | Admitting: Nurse Practitioner

## 2020-06-13 ENCOUNTER — Ambulatory Visit (INDEPENDENT_AMBULATORY_CARE_PROVIDER_SITE_OTHER): Payer: Medicare PPO | Admitting: Nurse Practitioner

## 2020-06-13 ENCOUNTER — Encounter: Payer: Self-pay | Admitting: Nurse Practitioner

## 2020-06-13 VITALS — BP 140/90 | HR 57 | Temp 97.7°F | Ht 59.0 in | Wt 149.0 lb

## 2020-06-13 DIAGNOSIS — F0391 Unspecified dementia with behavioral disturbance: Secondary | ICD-10-CM

## 2020-06-13 DIAGNOSIS — K5904 Chronic idiopathic constipation: Secondary | ICD-10-CM | POA: Diagnosis not present

## 2020-06-13 DIAGNOSIS — F411 Generalized anxiety disorder: Secondary | ICD-10-CM

## 2020-06-13 MED ORDER — SERTRALINE HCL 50 MG PO TABS: 50.0000 mg | ORAL_TABLET | Freq: Every day | ORAL | 1 refills | Status: DC

## 2020-06-13 NOTE — Patient Instructions (Addendum)
To start zoloft 25 mg (half tablet) daily for 2 weeks then increase to 1 tablet daily   Follow up in 4 weeks on mood  Increase miralax to daily until she is going more regularly then back down to every other day

## 2020-06-13 NOTE — Progress Notes (Signed)
Careteam: Patient Care Team: [name], NP as PCP - General (Geriatric Medicine) [name], MD as PCP - Cardiology (Cardiology)  PLACE OF SERVICE:  [facility]  Advanced Directive information    No Known Allergies  Chief Complaint  Patient presents with  . Acute Visit    Behavioral changes.Not sure if the disease is just worsening. Discuss medications. Patient has been having lack of sleep, lack of conversation (wondering conversation). Happening quite frequently. Talking to self at night. Anxiety gets high. Sits for a couple of minutes then right back up.Dont't know if changes could be due to constipation of UTI. Would like to discuss Buspirone seems to not be working as well.     HPI: Patient is a 86 y.o. female for follow up on behaviors and anxiety.  Over the last week there has been a lot of changes.  Anxiety/rage/combative is significant. Taking buspar 30 mg at one time now and only giving them 5 hours and she has increase in anxiety.  When anxiety is very high unable to give xanax or if they can it has no effect.  Things have gotten worse since her hip fracture.   Sleep is not doing well. Maybe up all night.   Reports BM was hard this morning from hemorrhoids.  Unsure if behavior are coming from getting use to medication, infection, constipation.   Increase in confusion as well. No able to hold a conversation.    Review of Systems:  Review of Systems  Unable to perform ROS: Dementia    Past Medical History:  Diagnosis Date  . Carotid stenosis    a. Carotid US (10/15):  Bilateral ICA 1-39%  . Coronary artery disease    a. NSTEMI >> LHC (02/11/14):  pLAD 95%, mLAD occl, CFX < 20%, pRCA 30%, EF 35%, ant and apical AK >> CABG  . Dementia (HCC)   . Glucose intolerance (impaired glucose tolerance)    a. A1c 6.1 (01/2014)  . H/O non-ST elevation myocardial infarction (NSTEMI)    01/2014  . HLD (hyperlipidemia)   . Hypertension   . Ischemic  cardiomyopathy    a. EF 35% by cath at time of NSTEMI >> b. Echo (10/15):  EF 50% to 55%. Wall motion was normal; Grade 1 diastolic dysfunction.  Mild AI.  Ascending aorta mildly dilated. Mild MR.  Mild LAE.  PA peak pressure: 46 mm Hg (S).   Past Surgical History:  Procedure Laterality Date  . CESAREAN SECTION    . CORONARY ARTERY BYPASS GRAFT N/A 02/15/2014   Procedure: CORONARY ARTERY BYPASS GRAFTING (CABG) x three,  using left internal mammary artery and right leg greater saphenous vein harvested endoscopically;  Surgeon: Alleen Borne, MD;  Location: [facility] OR;  Service: Open Heart Surgery;  Laterality: N/A;  . HIP ARTHROPLASTY Right 02/21/2020   Procedure: HEMIARTHROPLASTY ANTERIOR APPROACH;  Surgeon: Samson Frederic, MD;  Location: [facility] ORS;  Service: Orthopedics;  Laterality: Right;  . LEFT HEART CATHETERIZATION WITH CORONARY ANGIOGRAM N/A 02/11/2014   Procedure: LEFT HEART CATHETERIZATION WITH CORONARY ANGIOGRAM;  Surgeon: Peter M Swaziland, MD;  Location: [facility] CATH LAB;  Service: Cardiovascular;  Laterality: N/A;  . TEE WITHOUT CARDIOVERSION N/A 02/15/2014   Procedure: TRANSESOPHAGEAL ECHOCARDIOGRAM (TEE);  Surgeon: Alleen Borne, MD;  Location: [facility] OR;  Service: Open Heart Surgery;  Laterality: N/A;   Social History:   reports that she has never smoked. She has never used smokeless tobacco. She reports that she does not drink  alcohol and does not use drugs.  Family History  Problem Relation Age of Onset  . Stroke Mother   . Hypertension Mother   . Hypertension Sister   . Diabetes Brother   . Diabetes Sister   . Heart attack Neg Hx   . Breast cancer Neg Hx     Medications: Patient's Medications  New Prescriptions   No medications on file  Previous Medications   ACETAMINOPHEN (TYLENOL) 500 MG TABLET    Take 1,000 mg by mouth every 6 (six) hours as needed (pain).   ALPRAZOLAM (XANAX) 0.25 MG TABLET    TAKE 1 TABLET BY MOUTH TWICE A DAY AS NEEDED FOR ANXIETY   AMLODIPINE (NORVASC) 10  MG TABLET    Take 10 mg by mouth daily.   BUSPIRONE (BUSPAR) 30 MG TABLET    Take 15 mg by mouth 4 (four) times daily.    CALCIUM-MAGNESIUM-VITAMIN D 600-40-500 MG-MG-UNIT TB24    Take 1 tablet by mouth daily.   CHOLECALCIFEROL (VITAMIN D3) 50 MCG (2000 UT) TABS    Take 1 capsule by mouth daily.   EZETIMIBE (ZETIA) 10 MG TABLET    Take 10 mg by mouth daily.   LATANOPROST (XALATAN) 0.005 % OPHTHALMIC SOLUTION    Place 1 drop into the left eye at bedtime.    LEVOTHYROXINE (SYNTHROID) 100 MCG TABLET    TAKE 1 TABLET BY MOUTH DAILY BEFORE BREAKFAST.   POLYETHYLENE GLYCOL (MIRALAX / GLYCOLAX) 17 G PACKET    Take 17 g by mouth daily.  Modified Medications   No medications on file  Discontinued Medications   No medications on file    Physical Exam:  There were no vitals filed for this visit. There is no height or weight on file to calculate BMI. Wt Readings from Last 3 Encounters:  05/21/20 144 lb 12.8 oz (65.7 kg)  02/21/20 147 lb (66.7 kg)  02/15/20 139 lb (63 kg)    Physical Exam Constitutional:      General: She is not in acute distress.    Appearance: She is well-developed and well-nourished. She is not diaphoretic.  HENT:     Head: Normocephalic and atraumatic.     Mouth/Throat:     Mouth: Oropharynx is clear and moist.     Pharynx: No oropharyngeal exudate.  Eyes:     Conjunctiva/sclera: Conjunctivae normal.     Pupils: Pupils are equal, round, and reactive to light.  Cardiovascular:     Rate and Rhythm: Normal rate and regular rhythm.     Heart sounds: Normal heart sounds.  Pulmonary:     Effort: Pulmonary effort is normal.     Breath sounds: Normal breath sounds.  Abdominal:     General: Bowel sounds are normal.     Palpations: Abdomen is soft.  Musculoskeletal:        General: No tenderness or edema.     Cervical back: Normal range of motion and neck supple.  Skin:    General: Skin is warm and dry.  Neurological:     Mental Status: She is alert.  Psychiatric:         Mood and Affect: Mood is anxious. Affect is tearful.        Behavior: Behavior is cooperative.        Cognition and Memory: Cognition is impaired. Memory is impaired. She exhibits impaired recent memory.     Labs reviewed: Basic Metabolic Panel: Recent Labs    09/11/19 1008 09/21/19 1519 02/15/20 1136 02/21/20  0404 02/26/20 0823 02/27/20 0300 05/21/20 1218  NA  --    < > 142   < > 143 139 140  K  --    < > 4.3   < > 3.7 3.5 4.1  CL  --    < > 107   < > 112* 109 106  CO2  --    < > 28   < > 24 22 25   GLUCOSE  --    < > 83   < > 103* 102* 95  BUN  --    < > 10   < > 12 14 12   CREATININE  --    < > 0.94*   < > 0.62 0.76 0.88  CALCIUM  --    < > 10.2   < > 9.1 9.2 10.2  TSH 8.45*  --  0.51  --   --   --  2.83   < > = values in this interval not displayed.   Liver Function Tests: Recent Labs    02/25/20 0337 02/26/20 0823 02/27/20 0300 05/21/20 1218  AST 16 14* 16 13  ALT 11 10 12 8   ALKPHOS 29* 31* 35*  --   BILITOT 0.9 0.8 0.9 0.3  PROT 5.7* 6.1* 6.7 8.1  ALBUMIN 3.0* 3.1* 3.4*  --    No results for input(s): LIPASE, AMYLASE in the last 8760 hours. No results for input(s): AMMONIA in the last 8760 hours. CBC: Recent Labs    02/15/20 1136 02/21/20 0404 02/22/20 0316 02/24/20 0258 02/25/20 0337 05/21/20 1218  WBC 4.6 8.2   < > 7.1 6.6 6.2  NEUTROABS 2,714 6.1  --   --   --  3,695  HGB 12.9 12.9   < > 9.9* 10.0* 12.3  HCT 39.4 39.5   < > 29.7* 30.5* 37.2  MCV 89.7 91.0   < > 90.0 91.9 87.3  PLT 236 196   < > 148* 166 283   < > = values in this interval not displayed.   Lipid Panel: Recent Labs    02/15/20 1136 05/21/20 1218  CHOL 177 181  HDL 67 75  LDLCALC 96 86  TRIG 49 105  CHOLHDL 2.6 2.4   TSH: Recent Labs    09/11/19 1008 02/15/20 1136 05/21/20 1218  TSH 8.45* 0.51 2.83   A1C: Lab Results  Component Value Date   HGBA1C 6.1 (H) 02/11/2014     Assessment/Plan 1. Generalized anxiety disorder Worsening anxiety and agitation,  will check labs and urine to make sure not causes by abnormality with infection -will also add zoloft at this time to start 25 mg by mouth daily for 2 weeks then to increase to 50 mg daily  - sertraline (ZOLOFT) 50 MG tablet; Take 1 tablet (50 mg total) by mouth daily.  Dispense: 30 tablet; Refill: 1 - CBC with Differential/Platelet - COMPLETE METABOLIC PANEL WITH GFR  2. Chronic idiopathic constipation -increase miralax to daily at this time for symptom management. Decrease to every other day if she starts having loose stools.   3.  Dementia with behavioral disturbance, unspecified dementia type (HCC) Worsening mood and confusion.  -unable to leave urine at this time. Sent supplies home with son to collect and mediatation provided.  -will follow up labs at this time -may be progression of disease state, she has had significant decline since her hip fracture.  - CBC with Differential/Platelet - COMPLETE METABOLIC PANEL WITH GFR  Next appt: 4 weeks.  Carlos American. [place], [facility] [phone]

## 2020-06-14 LAB — COMPLETE METABOLIC PANEL WITH GFR
AG Ratio: 1.3 (calc) (ref 1.0–2.5)
ALT: 8 U/L (ref 6–29)
AST: 13 U/L (ref 10–35)
Albumin: 4.5 g/dL (ref 3.6–5.1)
Alkaline phosphatase (APISO): 54 U/L (ref 37–153)
BUN/Creatinine Ratio: 16 (calc) (ref 6–22)
BUN: 16 mg/dL (ref 7–25)
CO2: 26 mmol/L (ref 20–32)
Calcium: 10.3 mg/dL (ref 8.6–10.4)
Chloride: 104 mmol/L (ref 98–110)
Creat: 1 mg/dL — ABNORMAL HIGH (ref 0.60–0.88)
GFR, Est African American: 59 mL/min/{1.73_m2} — ABNORMAL LOW (ref >=60)
GFR, Est Non African American: 51 mL/min/{1.73_m2} — ABNORMAL LOW (ref >=60)
Globulin: 3.4 g/dL (calc) (ref 1.9–3.7)
Glucose, Bld: 94 mg/dL (ref 65–99)
Potassium: 4.2 mmol/L (ref 3.5–5.3)
Sodium: 139 mmol/L (ref 135–146)
Total Bilirubin: 0.3 mg/dL (ref 0.2–1.2)
Total Protein: 7.9 g/dL (ref 6.1–8.1)

## 2020-06-14 LAB — CBC WITH DIFFERENTIAL/PLATELET
Absolute Monocytes: 401 cells/uL (ref 200–950)
Basophils Absolute: 48 cells/uL (ref 0–200)
Basophils Relative: 0.7 %
Eosinophils Absolute: 88 cells/uL (ref 15–500)
Eosinophils Relative: 1.3 %
HCT: 37.1 % (ref 35.0–45.0)
Hemoglobin: 12.3 g/dL (ref 11.7–15.5)
Lymphs Abs: 2271 cells/uL (ref 850–3900)
MCH: 28.5 pg (ref 27.0–33.0)
MCHC: 33.2 g/dL (ref 32.0–36.0)
MCV: 86.1 fL (ref 80.0–100.0)
MPV: 9.2 fL (ref 7.5–12.5)
Monocytes Relative: 5.9 %
Neutro Abs: 3992 cells/uL (ref 1500–7800)
Neutrophils Relative %: 58.7 %
Platelets: 271 10*3/uL (ref 140–400)
RBC: 4.31 10*6/uL (ref 3.80–5.10)
RDW: 13 % (ref 11.0–15.0)
Total Lymphocyte: 33.4 %
WBC: 6.8 10*3/uL (ref 3.8–10.8)

## 2020-06-24 ENCOUNTER — Other Ambulatory Visit: Payer: Self-pay | Admitting: Nurse Practitioner

## 2020-06-24 DIAGNOSIS — F411 Generalized anxiety disorder: Secondary | ICD-10-CM

## 2020-06-24 NOTE — Telephone Encounter (Signed)
Patient has request refill on medication "Alprazolam 0.25mg ". Patient last refill was 05/27/2019 with 60 tablets to be taken twice daily as needed for anxiety. Medication pend and sent to PCP Janyth Contes Janene Harvey, NP for approval.

## 2020-07-05 ENCOUNTER — Other Ambulatory Visit: Payer: Self-pay | Admitting: Nurse Practitioner

## 2020-07-05 DIAGNOSIS — F411 Generalized anxiety disorder: Secondary | ICD-10-CM

## 2020-07-16 ENCOUNTER — Ambulatory Visit: Payer: Medicare PPO | Admitting: Nurse Practitioner

## 2020-07-23 ENCOUNTER — Other Ambulatory Visit: Payer: Self-pay | Admitting: Nurse Practitioner

## 2020-07-23 DIAGNOSIS — F411 Generalized anxiety disorder: Secondary | ICD-10-CM

## 2020-07-23 NOTE — Telephone Encounter (Signed)
RX last filled on 06/25/2020  Treatment agreement last signed in January 2021, notation made on pending appointment for May 2022

## 2020-08-21 ENCOUNTER — Other Ambulatory Visit: Payer: Self-pay | Admitting: Nurse Practitioner

## 2020-08-21 DIAGNOSIS — F411 Generalized anxiety disorder: Secondary | ICD-10-CM

## 2020-09-17 ENCOUNTER — Other Ambulatory Visit: Payer: Self-pay

## 2020-09-17 ENCOUNTER — Encounter: Payer: Self-pay | Admitting: Nurse Practitioner

## 2020-09-17 ENCOUNTER — Ambulatory Visit (INDEPENDENT_AMBULATORY_CARE_PROVIDER_SITE_OTHER): Payer: Medicare PPO | Admitting: Nurse Practitioner

## 2020-09-17 VITALS — BP 120/70 | HR 56 | Temp 97.6°F | Ht 59.0 in | Wt 153.4 lb

## 2020-09-17 DIAGNOSIS — F411 Generalized anxiety disorder: Secondary | ICD-10-CM

## 2020-09-17 DIAGNOSIS — G4700 Insomnia, unspecified: Secondary | ICD-10-CM | POA: Diagnosis not present

## 2020-09-17 DIAGNOSIS — I10 Essential (primary) hypertension: Secondary | ICD-10-CM

## 2020-09-17 DIAGNOSIS — E782 Mixed hyperlipidemia: Secondary | ICD-10-CM

## 2020-09-17 DIAGNOSIS — F0391 Unspecified dementia with behavioral disturbance: Secondary | ICD-10-CM | POA: Diagnosis not present

## 2020-09-17 DIAGNOSIS — Z6830 Body mass index (BMI) 30.0-30.9, adult: Secondary | ICD-10-CM

## 2020-09-17 DIAGNOSIS — Z7189 Other specified counseling: Secondary | ICD-10-CM

## 2020-09-17 MED ORDER — MIRTAZAPINE 15 MG PO TABS: 15.0000 mg | ORAL_TABLET | Freq: Every day | ORAL | 1 refills | Status: DC

## 2020-09-17 NOTE — Progress Notes (Signed)
Careteam: Patient Care Team: [name], NP as PCP - General (Geriatric Medicine) [name], MD as PCP - Cardiology (Cardiology)  PLACE OF SERVICE:  [facility]  Advanced Directive information Does Patient Have a Medical Advance Directive?: Yes, Type of Advance Directive: Healthcare Power of Attorney, Does patient want to make changes to medical advance directive?: No - Patient declined  Allergies  Allergen Reactions  . Namenda [Memantine]   . Trazodone And Nefazodone     Chief Complaint  Patient presents with  . Medical Management of Chronic Issues    4 month follow up. Discuss need for Tetanus/Tdap. Anxiety is up no sleeping at night. Taking Aprazolam, but takes a while to kick in. Are there other options. Can the stage of disease be determined.      HPI: Patient is a 87 y.o. female for routine follow up.   Continues with increase in anxiety and decrease sleep.  Want to make sure they are maximizing medication use without adding to side effects.  Increase in agitation, trying to leave the house. Trying to go out the doors. Also has to block off certain rooms.  Increase in yelling last night due to increase in fatigue.  She will stay up all night.  Rarely naps.  Did not tolerate seroquel.  Did not tolerate zoloft.  Trazodone did not help.  A lot of the medications have opposite effect.   Taking buspar-will make her wild if she takes too much at much.  Does better if she takes 15 mg at a time.   Eats twice daily, will have snacks after medication.  In the last 2 weeks will eat minimally for breakfast.  Offering water frequently and drinks well most days    Review of Systems:  Review of Systems  Unable to perform ROS: Dementia    Past Medical History:  Diagnosis Date  . Carotid stenosis    a. Carotid US (10/15):  Bilateral ICA 1-39%  . Coronary artery disease    a. NSTEMI >> LHC (02/11/14):  pLAD 95%, mLAD occl, CFX < 20%, pRCA 30%, EF 35%, ant  and apical AK >> CABG  . Dementia (HCC)   . Glucose intolerance (impaired glucose tolerance)    a. A1c 6.1 (01/2014)  . H/O non-ST elevation myocardial infarction (NSTEMI)    01/2014  . HLD (hyperlipidemia)   . Hypertension   . Ischemic cardiomyopathy    a. EF 35% by cath at time of NSTEMI >> b. Echo (10/15):  EF 50% to 55%. Wall motion was normal; Grade 1 diastolic dysfunction.  Mild AI.  Ascending aorta mildly dilated. Mild MR.  Mild LAE.  PA peak pressure: 46 mm Hg (S).   Past Surgical History:  Procedure Laterality Date  . CESAREAN SECTION    . CORONARY ARTERY BYPASS GRAFT N/A 02/15/2014   Procedure: CORONARY ARTERY BYPASS GRAFTING (CABG) x three,  using left internal mammary artery and right leg greater saphenous vein harvested endoscopically;  Surgeon: Alleen Borne, MD;  Location: [facility] OR;  Service: Open Heart Surgery;  Laterality: N/A;  . HIP ARTHROPLASTY Right 02/21/2020   Procedure: HEMIARTHROPLASTY ANTERIOR APPROACH;  Surgeon: Samson Frederic, MD;  Location: [facility] ORS;  Service: Orthopedics;  Laterality: Right;  . LEFT HEART CATHETERIZATION WITH CORONARY ANGIOGRAM N/A 02/11/2014   Procedure: LEFT HEART CATHETERIZATION WITH CORONARY ANGIOGRAM;  Surgeon: Peter M Swaziland, MD;  Location: [facility] CATH LAB;  Service: Cardiovascular;  Laterality: N/A;  . TEE WITHOUT CARDIOVERSION N/A 02/15/2014  Procedure: TRANSESOPHAGEAL ECHOCARDIOGRAM (TEE);  Surgeon: Alleen Borne, MD;  Location: [facility] OR;  Service: Open Heart Surgery;  Laterality: N/A;   Social History:   reports that she has never smoked. She has never used smokeless tobacco. She reports that she does not drink alcohol and does not use drugs.  Family History  Problem Relation Age of Onset  . Stroke Mother   . Hypertension Mother   . Hypertension Sister   . Diabetes Brother   . Diabetes Sister   . Heart attack Neg Hx   . Breast cancer Neg Hx     Medications: Patient's Medications  New Prescriptions   No medications on file   Previous Medications   ACETAMINOPHEN (TYLENOL) 500 MG TABLET    Take 1,000 mg by mouth every 6 (six) hours as needed (pain).   ALPRAZOLAM (XANAX) 0.25 MG TABLET    TAKE 1 TABLET BY MOUTH TWICE A DAY AS NEEDED FOR ANXIETY   AMLODIPINE (NORVASC) 10 MG TABLET    Take 10 mg by mouth daily.   BUSPIRONE (BUSPAR) 30 MG TABLET    Take 15 mg by mouth 4 (four) times daily.    CALCIUM-MAGNESIUM-VITAMIN D 600-40-500 MG-MG-UNIT TB24    Take 1 tablet by mouth daily.   CHOLECALCIFEROL (VITAMIN D3) 50 MCG (2000 UT) TABS    Take 1 capsule by mouth daily.   EZETIMIBE (ZETIA) 10 MG TABLET    Take 10 mg by mouth daily.   LATANOPROST (XALATAN) 0.005 % OPHTHALMIC SOLUTION    Place 1 drop into the left eye at bedtime.    LEVOTHYROXINE (SYNTHROID) 100 MCG TABLET    TAKE 1 TABLET BY MOUTH DAILY BEFORE BREAKFAST.   POLYETHYLENE GLYCOL (MIRALAX / GLYCOLAX) 17 G PACKET    Take 17 g by mouth daily.  Modified Medications   No medications on file  Discontinued Medications   SERTRALINE (ZOLOFT) 50 MG TABLET    TAKE 1 TABLET BY MOUTH EVERY DAY    Physical Exam:  Vitals:   09/17/20 1500  BP: 120/70  Pulse: (!) 56  Temp: 97.6 F (36.4 C)  TempSrc: Temporal  SpO2: 98%  Weight: 153 lb 6.4 oz (69.6 kg)  Height: 4\' 11"  (1.499 m)   Body mass index is 30.98 kg/m. Wt Readings from Last 3 Encounters:  09/17/20 153 lb 6.4 oz (69.6 kg)  06/13/20 149 lb (67.6 kg)  05/21/20 144 lb 12.8 oz (65.7 kg)    Physical Exam Constitutional:      General: She is not in acute distress.    Appearance: She is well-developed. She is not diaphoretic.  HENT:     Head: Normocephalic and atraumatic.     Mouth/Throat:     Pharynx: No oropharyngeal exudate.  Eyes:     Conjunctiva/sclera: Conjunctivae normal.     Pupils: Pupils are equal, round, and reactive to light.  Cardiovascular:     Rate and Rhythm: Normal rate and regular rhythm.     Heart sounds: Normal heart sounds.  Pulmonary:     Effort: Pulmonary effort is normal.      Breath sounds: Normal breath sounds.  Abdominal:     General: Bowel sounds are normal.     Palpations: Abdomen is soft.  Musculoskeletal:        General: No tenderness.     Cervical back: Normal range of motion and neck supple.  Skin:    General: Skin is warm and dry.  Neurological:     Mental Status: She  is alert. Mental status is at baseline.  Psychiatric:        Cognition and Memory: Cognition is impaired. Memory is impaired. She exhibits impaired recent memory and impaired remote memory.     Labs reviewed: Basic Metabolic Panel: Recent Labs    02/15/20 1136 02/21/20 0404 02/27/20 0300 05/21/20 1218 06/13/20 1628  NA 142   < > 139 140 139  K 4.3   < > 3.5 4.1 4.2  CL 107   < > 109 106 104  CO2 28   < > 22 25 26   GLUCOSE 83   < > 102* 95 94  BUN 10   < > 14 12 16   CREATININE 0.94*   < > 0.76 0.88 1.00*  CALCIUM 10.2   < > 9.2 10.2 10.3  TSH 0.51  --   --  2.83  --    < > = values in this interval not displayed.   Liver Function Tests: Recent Labs    02/25/20 0337 02/26/20 0823 02/27/20 0300 05/21/20 1218 06/13/20 1628  AST 16 14* 16 13 13   ALT 11 10 12 8 8   ALKPHOS 29* 31* 35*  --   --   BILITOT 0.9 0.8 0.9 0.3 0.3  PROT 5.7* 6.1* 6.7 8.1 7.9  ALBUMIN 3.0* 3.1* 3.4*  --   --    No results for input(s): LIPASE, AMYLASE in the last 8760 hours. No results for input(s): AMMONIA in the last 8760 hours. CBC: Recent Labs    02/21/20 0404 02/22/20 0316 02/25/20 0337 05/21/20 1218 06/13/20 1628  WBC 8.2   < > 6.6 6.2 6.8  NEUTROABS 6.1  --   --  3,695 3,992  HGB 12.9   < > 10.0* 12.3 12.3  HCT 39.5   < > 30.5* 37.2 37.1  MCV 91.0   < > 91.9 87.3 86.1  PLT 196   < > 166 283 271   < > = values in this interval not displayed.   Lipid Panel: Recent Labs    02/15/20 1136 05/21/20 1218  CHOL 177 181  HDL 67 75  LDLCALC 96 86  TRIG 49 105  CHOLHDL 2.6 2.4   TSH: Recent Labs    02/15/20 1136 05/21/20 1218  TSH 0.51 2.83   A1C: Lab Results   Component Value Date   HGBA1C 6.1 (H) 02/11/2014     Assessment/Plan 1. Dementia with behavioral disturbance, unspecified dementia type (HCC) -advanced dementia with behaviors. Agitation and behaviors noted. She has a hard time sleeping. 24/7 supervision by children.  - DNR (Do Not Rescitate)  2. Insomnia, unspecified type -ongoing. Will start remeron to help with mood and sleep.  - mirtazapine (REMERON) 15 MG tablet; Take 1 tablet (15 mg total) by mouth at bedtime.  Dispense: 30 tablet; Refill: 1  3. Generalized anxiety disorder -ongoing. Continues on buspar  - mirtazapine (REMERON) 15 MG tablet; Take 1 tablet (15 mg total) by mouth at bedtime.  Dispense: 30 tablet; Refill: 1  4. Essential hypertension -stable on norvasc 10 mg daily, encouraged dietary modifications.   5. Mixed hyperlipidemia -on zetia, continue medication and dietary modification.   6. Body mass index (BMI) of 30.0-30.9 in adult -education provided on healthy weight loss through increase in physical activity and proper nutrition   7. ACP (advance care planning) -most form completed - DNR (Do Not Resuscitate)  Next appt: 3 months.  02/17/20. 05/23/20  [facility] [phone]

## 2020-09-19 ENCOUNTER — Other Ambulatory Visit: Payer: Self-pay | Admitting: Nurse Practitioner

## 2020-09-19 DIAGNOSIS — F411 Generalized anxiety disorder: Secondary | ICD-10-CM

## 2020-09-19 NOTE — Telephone Encounter (Signed)
Patient has requested refill on medication "Alprazolam 0.25mg ". Patient last refill was 08/21/2020. Patient is due for refill tomorrow 09/20/2020. Medication pend and sent to Hazle Nordmann, NP due to PCP Janyth Contes Janene Harvey, NP being out of office.

## 2020-10-08 ENCOUNTER — Encounter: Payer: Self-pay | Admitting: Nurse Practitioner

## 2020-10-08 ENCOUNTER — Other Ambulatory Visit: Payer: Self-pay

## 2020-10-08 ENCOUNTER — Telehealth: Payer: Medicare PPO | Admitting: Nurse Practitioner

## 2020-10-08 DIAGNOSIS — J209 Acute bronchitis, unspecified: Secondary | ICD-10-CM

## 2020-10-08 MED ORDER — DOXYCYCLINE HYCLATE 100 MG PO TABS: 100.0000 mg | ORAL_TABLET | Freq: Two times a day (BID) | ORAL | 0 refills | Status: DC

## 2020-10-08 NOTE — Progress Notes (Signed)
   This service is provided via telemedicine  No vital signs collected/recorded due to the encounter was a telemedicine visit.   Location of patient (ex: home, work):  Home  Patient consents to a telephone visit: Yes, see telephone visit dated 03/10/2020  Location of the provider (ex: office, home):  [facility] Adult [facility], Office   Name of any referring provider:  N/A  Names of all persons participating in the telemedicine service and their role in the encounter:  S.Chrae B/CMA, Abbey Chatters, NP, [name] (daughter),  and Patient   Time spent on call:  6 min with medical assistant

## 2020-10-08 NOTE — Progress Notes (Signed)
Careteam: Patient Care Team: [name], NP as PCP - General (Geriatric Medicine) [name], MD as PCP - Cardiology (Cardiology)  Advanced Directive information    Allergies  Allergen Reactions  . Namenda [Memantine]   . Trazodone And Nefazodone     Chief Complaint  Patient presents with  . Acute Visit    Cough (x several weeks) and congestion x 1.5 weeks. Visit performed via video visit      HPI: Patient is a 87 y.o. female due to ongoing cough. Reports cough which was a dry cough for a few weeks. ?dust in the air, then her daughter had a head cold and then had bronchitis now pt having similar symptoms.  Increase congestion in chest.  Has not taken anything, unsure what to give her.  No fever. Not eating. Drinking well.  Increase fatigue, does not sleep well (normal for her). Did home COVID test negative.  No fevers, no shortness breath. No chest pain.    Review of Systems: provided by daughter  Review of Systems  Constitutional: Positive for malaise/fatigue. Negative for chills, fever and weight loss.  HENT: Negative for congestion, sinus pain, sore throat and tinnitus.   Respiratory: Positive for cough and sputum production. Negative for shortness of breath.   Cardiovascular: Negative for chest pain, palpitations and leg swelling.  Gastrointestinal: Negative for abdominal pain, constipation, diarrhea and heartburn.  Genitourinary: Negative for dysuria, frequency and urgency.  Musculoskeletal: Negative for back pain, falls, joint pain and myalgias.  Skin: Negative.   Neurological: Negative for dizziness and headaches.  Psychiatric/Behavioral: Positive for memory loss. Negative for depression. The patient does not have insomnia.     Past Medical History:  Diagnosis Date  . Carotid stenosis    a. Carotid US (10/15):  Bilateral ICA 1-39%  . Coronary artery disease    a. NSTEMI >> LHC (02/11/14):  pLAD 95%, mLAD occl, CFX < 20%, pRCA 30%, EF 35%, ant  and apical AK >> CABG  . Dementia (HCC)   . Glucose intolerance (impaired glucose tolerance)    a. A1c 6.1 (01/2014)  . H/O non-ST elevation myocardial infarction (NSTEMI)    01/2014  . HLD (hyperlipidemia)   . Hypertension   . Ischemic cardiomyopathy    a. EF 35% by cath at time of NSTEMI >> b. Echo (10/15):  EF 50% to 55%. Wall motion was normal; Grade 1 diastolic dysfunction.  Mild AI.  Ascending aorta mildly dilated. Mild MR.  Mild LAE.  PA peak pressure: 46 mm Hg (S).   Past Surgical History:  Procedure Laterality Date  . CESAREAN SECTION    . CORONARY ARTERY BYPASS GRAFT N/A 02/15/2014   Procedure: CORONARY ARTERY BYPASS GRAFTING (CABG) x three,  using left internal mammary artery and right leg greater saphenous vein harvested endoscopically;  Surgeon: Alleen Borne, MD;  Location: [facility] OR;  Service: Open Heart Surgery;  Laterality: N/A;  . HIP ARTHROPLASTY Right 02/21/2020   Procedure: HEMIARTHROPLASTY ANTERIOR APPROACH;  Surgeon: Samson Frederic, MD;  Location: [facility] ORS;  Service: Orthopedics;  Laterality: Right;  . LEFT HEART CATHETERIZATION WITH CORONARY ANGIOGRAM N/A 02/11/2014   Procedure: LEFT HEART CATHETERIZATION WITH CORONARY ANGIOGRAM;  Surgeon: Peter M Swaziland, MD;  Location: [facility] CATH LAB;  Service: Cardiovascular;  Laterality: N/A;  . TEE WITHOUT CARDIOVERSION N/A 02/15/2014   Procedure: TRANSESOPHAGEAL ECHOCARDIOGRAM (TEE);  Surgeon: Alleen Borne, MD;  Location: [facility] OR;  Service: Open Heart Surgery;  Laterality: N/A;   Social History:  reports that she has never smoked. She has never used smokeless tobacco. She reports that she does not drink alcohol and does not use drugs.  Family History  Problem Relation Age of Onset  . Stroke Mother   . Hypertension Mother   . Hypertension Sister   . Diabetes Brother   . Diabetes Sister   . Heart attack Neg Hx   . Breast cancer Neg Hx     Medications: Patient's Medications  New Prescriptions   No medications on file   Previous Medications   ACETAMINOPHEN (TYLENOL) 500 MG TABLET    Take 1,000 mg by mouth every 6 (six) hours as needed (pain).   ALPRAZOLAM (XANAX) 0.25 MG TABLET    TAKE 1 TABLET BY MOUTH TWICE A DAY AS NEEDED FOR ANXIETY   AMLODIPINE (NORVASC) 10 MG TABLET    Take 10 mg by mouth daily.   BUSPIRONE (BUSPAR) 30 MG TABLET    Take 15 mg by mouth 4 (four) times daily.    CALCIUM-MAGNESIUM-VITAMIN D 600-40-500 MG-MG-UNIT TB24    Take 1 tablet by mouth daily.   CHOLECALCIFEROL (VITAMIN D3) 50 MCG (2000 UT) TABS    Take 1 capsule by mouth daily.   EZETIMIBE (ZETIA) 10 MG TABLET    Take 10 mg by mouth daily.   LATANOPROST (XALATAN) 0.005 % OPHTHALMIC SOLUTION    Place 1 drop into the left eye at bedtime.    LEVOTHYROXINE (SYNTHROID) 100 MCG TABLET    TAKE 1 TABLET BY MOUTH DAILY BEFORE BREAKFAST.   MIRTAZAPINE (REMERON) 15 MG TABLET    Take 1 tablet (15 mg total) by mouth at bedtime.   POLYETHYLENE GLYCOL (MIRALAX / GLYCOLAX) 17 G PACKET    Take 17 g by mouth daily.  Modified Medications   No medications on file  Discontinued Medications   No medications on file    Physical Exam:  There were no vitals filed for this visit. There is no height or weight on file to calculate BMI. Wt Readings from Last 3 Encounters:  09/17/20 153 lb 6.4 oz (69.6 kg)  06/13/20 149 lb (67.6 kg)  05/21/20 144 lb 12.8 oz (65.7 kg)    Labs reviewed: Basic Metabolic Panel: Recent Labs    02/15/20 1136 02/21/20 0404 02/27/20 0300 05/21/20 1218 06/13/20 1628  NA 142   < > 139 140 139  K 4.3   < > 3.5 4.1 4.2  CL 107   < > 109 106 104  CO2 28   < > 22 25 26   GLUCOSE 83   < > 102* 95 94  BUN 10   < > 14 12 16   CREATININE 0.94*   < > 0.76 0.88 1.00*  CALCIUM 10.2   < > 9.2 10.2 10.3  TSH 0.51  --   --  2.83  --    < > = values in this interval not displayed.   Liver Function Tests: Recent Labs    02/25/20 0337 02/26/20 0823 02/27/20 0300 05/21/20 1218 06/13/20 1628  AST 16 14* 16 13 13   ALT 11 10  12 8 8   ALKPHOS 29* 31* 35*  --   --   BILITOT 0.9 0.8 0.9 0.3 0.3  PROT 5.7* 6.1* 6.7 8.1 7.9  ALBUMIN 3.0* 3.1* 3.4*  --   --    No results for input(s): LIPASE, AMYLASE in the last 8760 hours. No results for input(s): AMMONIA in the last 8760 hours. CBC: Recent Labs    02/21/20  0404 02/22/20 0316 02/25/20 0337 05/21/20 1218 06/13/20 1628  WBC 8.2   < > 6.6 6.2 6.8  NEUTROABS 6.1  --   --  3,695 3,992  HGB 12.9   < > 10.0* 12.3 12.3  HCT 39.5   < > 30.5* 37.2 37.1  MCV 91.0   < > 91.9 87.3 86.1  PLT 196   < > 166 283 271   < > = values in this interval not displayed.   Lipid Panel: Recent Labs    02/15/20 1136 05/21/20 1218  CHOL 177 181  HDL 67 75  LDLCALC 96 86  TRIG 49 105  CHOLHDL 2.6 2.4   TSH: Recent Labs    02/15/20 1136 05/21/20 1218  TSH 0.51 2.83   A1C: Lab Results  Component Value Date   HGBA1C 6.1 (H) 02/11/2014     Assessment/Plan 1. Acute bronchitis, unspecified organism -start mucinex DM by mouth twice daily with full glass of water -tylenol PRN - doxycycline (VIBRA-TABS) 100 MG tablet; Take 1 tablet (100 mg total) by mouth 2 (two) times daily.  Dispense: 14 tablet; Refill: 0 -return/follow up precautions discussed with daughter   Janene Harvey. Biagio Borg  [facility] [phone]    Virtual Visit via Earleen Reaper  I connected with patient on 10/08/20 at 11:30 AM EDT by video and verified that I am speaking with the correct person using two identifiers.  Location: Patient: home Provider: psc   I discussed the limitations, risks, security and privacy concerns of performing an evaluation and management service by telephone and the availability of in person appointments. I also discussed with the patient that there may be a patient responsible charge related to this service. The patient expressed understanding and agreed to proceed.   I discussed the assessment and treatment plan with the patient. The patient  was provided an opportunity to ask questions and all were answered. The patient agreed with the plan and demonstrated an understanding of the instructions.   The patient was advised to call back or seek an in-person evaluation if the symptoms worsen or if the condition fails to improve as anticipated.  I provided 15 minutes of non-face-to-face time during this encounter.  Janene Harvey. Biagio Borg Avs printed and mailed

## 2020-10-09 ENCOUNTER — Other Ambulatory Visit: Payer: Self-pay | Admitting: Nurse Practitioner

## 2020-10-09 DIAGNOSIS — G4700 Insomnia, unspecified: Secondary | ICD-10-CM

## 2020-10-09 DIAGNOSIS — F411 Generalized anxiety disorder: Secondary | ICD-10-CM

## 2020-10-10 ENCOUNTER — Emergency Department (HOSPITAL_COMMUNITY): Payer: Medicare PPO

## 2020-10-10 ENCOUNTER — Inpatient Hospital Stay (HOSPITAL_COMMUNITY)
Admission: EM | Admit: 2020-10-10 | Discharge: 2020-10-17 | DRG: 202 | Disposition: A | Discharge status: Home Health | Payer: Medicare PPO | Attending: Internal Medicine | Admitting: Internal Medicine

## 2020-10-10 ENCOUNTER — Other Ambulatory Visit: Payer: Self-pay

## 2020-10-10 ENCOUNTER — Encounter (HOSPITAL_COMMUNITY): Payer: Self-pay | Admitting: Internal Medicine

## 2020-10-10 ENCOUNTER — Encounter: Payer: Self-pay | Admitting: Orthopedic Surgery

## 2020-10-10 ENCOUNTER — Ambulatory Visit: Payer: Medicare PPO | Admitting: Orthopedic Surgery

## 2020-10-10 VITALS — BP 140/90 | Temp 97.8°F | Ht 59.0 in

## 2020-10-10 DIAGNOSIS — G9340 Encephalopathy, unspecified: Secondary | ICD-10-CM | POA: Diagnosis present

## 2020-10-10 DIAGNOSIS — N132 Hydronephrosis with renal and ureteral calculous obstruction: Secondary | ICD-10-CM | POA: Diagnosis present

## 2020-10-10 DIAGNOSIS — F0391 Unspecified dementia with behavioral disturbance: Secondary | ICD-10-CM | POA: Diagnosis present

## 2020-10-10 DIAGNOSIS — F03918 Unspecified dementia, unspecified severity, with other behavioral disturbance: Secondary | ICD-10-CM | POA: Diagnosis present

## 2020-10-10 DIAGNOSIS — Z79899 Other long term (current) drug therapy: Secondary | ICD-10-CM

## 2020-10-10 DIAGNOSIS — R600 Localized edema: Secondary | ICD-10-CM

## 2020-10-10 DIAGNOSIS — I5023 Acute on chronic systolic (congestive) heart failure: Secondary | ICD-10-CM | POA: Diagnosis present

## 2020-10-10 DIAGNOSIS — R0602 Shortness of breath: Secondary | ICD-10-CM

## 2020-10-10 DIAGNOSIS — I2510 Atherosclerotic heart disease of native coronary artery without angina pectoris: Secondary | ICD-10-CM | POA: Diagnosis present

## 2020-10-10 DIAGNOSIS — T380X5A Adverse effect of glucocorticoids and synthetic analogues, initial encounter: Secondary | ICD-10-CM | POA: Diagnosis not present

## 2020-10-10 DIAGNOSIS — I509 Heart failure, unspecified: Secondary | ICD-10-CM

## 2020-10-10 DIAGNOSIS — N179 Acute kidney failure, unspecified: Secondary | ICD-10-CM | POA: Diagnosis present

## 2020-10-10 DIAGNOSIS — E785 Hyperlipidemia, unspecified: Secondary | ICD-10-CM | POA: Diagnosis present

## 2020-10-10 DIAGNOSIS — Z8249 Family history of ischemic heart disease and other diseases of the circulatory system: Secondary | ICD-10-CM

## 2020-10-10 DIAGNOSIS — E871 Hypo-osmolality and hyponatremia: Secondary | ICD-10-CM | POA: Diagnosis present

## 2020-10-10 DIAGNOSIS — R509 Fever, unspecified: Secondary | ICD-10-CM

## 2020-10-10 DIAGNOSIS — J209 Acute bronchitis, unspecified: Secondary | ICD-10-CM | POA: Diagnosis not present

## 2020-10-10 DIAGNOSIS — R059 Cough, unspecified: Secondary | ICD-10-CM

## 2020-10-10 DIAGNOSIS — Z79890 Hormone replacement therapy (postmenopausal): Secondary | ICD-10-CM

## 2020-10-10 DIAGNOSIS — E876 Hypokalemia: Secondary | ICD-10-CM | POA: Diagnosis not present

## 2020-10-10 DIAGNOSIS — E669 Obesity, unspecified: Secondary | ICD-10-CM | POA: Diagnosis present

## 2020-10-10 DIAGNOSIS — I255 Ischemic cardiomyopathy: Secondary | ICD-10-CM | POA: Diagnosis present

## 2020-10-10 DIAGNOSIS — E039 Hypothyroidism, unspecified: Secondary | ICD-10-CM | POA: Diagnosis present

## 2020-10-10 DIAGNOSIS — R330 Drug induced retention of urine: Secondary | ICD-10-CM | POA: Diagnosis not present

## 2020-10-10 DIAGNOSIS — Z20822 Contact with and (suspected) exposure to covid-19: Secondary | ICD-10-CM | POA: Diagnosis present

## 2020-10-10 DIAGNOSIS — T502X5A Adverse effect of carbonic-anhydrase inhibitors, benzothiadiazides and other diuretics, initial encounter: Secondary | ICD-10-CM | POA: Diagnosis present

## 2020-10-10 DIAGNOSIS — I6529 Occlusion and stenosis of unspecified carotid artery: Secondary | ICD-10-CM | POA: Diagnosis present

## 2020-10-10 DIAGNOSIS — E559 Vitamin D deficiency, unspecified: Secondary | ICD-10-CM | POA: Diagnosis present

## 2020-10-10 DIAGNOSIS — F411 Generalized anxiety disorder: Secondary | ICD-10-CM | POA: Diagnosis present

## 2020-10-10 DIAGNOSIS — Z951 Presence of aortocoronary bypass graft: Secondary | ICD-10-CM

## 2020-10-10 DIAGNOSIS — G4700 Insomnia, unspecified: Secondary | ICD-10-CM | POA: Diagnosis present

## 2020-10-10 DIAGNOSIS — Z66 Do not resuscitate: Secondary | ICD-10-CM | POA: Diagnosis present

## 2020-10-10 DIAGNOSIS — R338 Other retention of urine: Secondary | ICD-10-CM

## 2020-10-10 DIAGNOSIS — Z6830 Body mass index (BMI) 30.0-30.9, adult: Secondary | ICD-10-CM

## 2020-10-10 DIAGNOSIS — I252 Old myocardial infarction: Secondary | ICD-10-CM

## 2020-10-10 DIAGNOSIS — I110 Hypertensive heart disease with heart failure: Secondary | ICD-10-CM | POA: Diagnosis present

## 2020-10-10 LAB — URINALYSIS, ROUTINE W REFLEX MICROSCOPIC
Bilirubin Urine: NEGATIVE
Glucose, UA: NEGATIVE mg/dL
Ketones, ur: 5 mg/dL — AB
Leukocytes,Ua: NEGATIVE
Nitrite: NEGATIVE
Protein, ur: NEGATIVE mg/dL
Specific Gravity, Urine: 1.01 (ref 1.005–1.030)
pH: 5 (ref 5.0–8.0)

## 2020-10-10 LAB — CBC WITH DIFFERENTIAL/PLATELET
Abs Immature Granulocytes: 0.01 10*3/uL (ref 0.00–0.07)
Basophils Absolute: 0 10*3/uL (ref 0.0–0.1)
Basophils Relative: 0 %
Eosinophils Absolute: 0 10*3/uL (ref 0.0–0.5)
Eosinophils Relative: 0 %
HCT: 36 % (ref 36.0–46.0)
Hemoglobin: 11.8 g/dL — ABNORMAL LOW (ref 12.0–15.0)
Immature Granulocytes: 0 %
Lymphocytes Relative: 15 %
Lymphs Abs: 0.8 10*3/uL (ref 0.7–4.0)
MCH: 28.3 pg (ref 26.0–34.0)
MCHC: 32.8 g/dL (ref 30.0–36.0)
MCV: 86.3 fL (ref 80.0–100.0)
Monocytes Absolute: 0.3 10*3/uL (ref 0.1–1.0)
Monocytes Relative: 7 %
Neutro Abs: 4 10*3/uL (ref 1.7–7.7)
Neutrophils Relative %: 78 %
Platelets: 198 10*3/uL (ref 150–400)
RBC: 4.17 MIL/uL (ref 3.87–5.11)
RDW: 15.3 % (ref 11.5–15.5)
WBC: 5.1 10*3/uL (ref 4.0–10.5)
nRBC: 0 % (ref 0.0–0.2)

## 2020-10-10 LAB — GLUCOSE, CAPILLARY: Glucose-Capillary: 98 mg/dL (ref 70–99)

## 2020-10-10 LAB — APTT: aPTT: 34 seconds (ref 24–36)

## 2020-10-10 LAB — COMPREHENSIVE METABOLIC PANEL
ALT: 14 U/L (ref 0–44)
AST: 26 U/L (ref 15–41)
Albumin: 3.8 g/dL (ref 3.5–5.0)
Alkaline Phosphatase: 41 U/L (ref 38–126)
Anion gap: 11 (ref 5–15)
BUN: 10 mg/dL (ref 8–23)
CO2: 22 mmol/L (ref 22–32)
Calcium: 9.2 mg/dL (ref 8.9–10.3)
Chloride: 94 mmol/L — ABNORMAL LOW (ref 98–111)
Creatinine, Ser: 0.85 mg/dL (ref 0.44–1.00)
GFR, Estimated: >60 mL/min (ref >60)
Glucose, Bld: 98 mg/dL (ref 70–99)
Potassium: 3.7 mmol/L (ref 3.5–5.1)
Sodium: 127 mmol/L — ABNORMAL LOW (ref 135–145)
Total Bilirubin: 0.5 mg/dL (ref 0.3–1.2)
Total Protein: 7.6 g/dL (ref 6.5–8.1)

## 2020-10-10 LAB — LACTIC ACID, PLASMA
Lactic Acid, Venous: 1.1 mmol/L (ref 0.5–1.9)
Lactic Acid, Venous: 1 mmol/L (ref 0.5–1.9)

## 2020-10-10 LAB — RESP PANEL BY RT-PCR (FLU A&B, COVID) ARPGX2
Influenza A by PCR: NEGATIVE
Influenza B by PCR: NEGATIVE
SARS Coronavirus 2 by RT PCR: NEGATIVE

## 2020-10-10 LAB — BRAIN NATRIURETIC PEPTIDE: B Natriuretic Peptide: 231.4 pg/mL — ABNORMAL HIGH (ref 0.0–100.0)

## 2020-10-10 LAB — PROTIME-INR
INR: 1.1 (ref 0.8–1.2)
Prothrombin Time: 14.7 seconds (ref 11.4–15.2)

## 2020-10-10 MED ORDER — MIRTAZAPINE 15 MG PO TABS: 15.0000 mg | ORAL_TABLET | Freq: Every day | ORAL | Status: DC

## 2020-10-10 MED ORDER — ACETAMINOPHEN 500 MG PO TABS
1000.0000 mg | ORAL_TABLET | Freq: Once | ORAL | Status: AC
  Administered 2020-10-10: 1000 mg via ORAL
  Filled 2020-10-10: qty 2

## 2020-10-10 MED ORDER — ACETAMINOPHEN 325 MG PO TABS
650.0000 mg | ORAL_TABLET | Freq: Four times a day (QID) | ORAL | Status: DC | PRN
  Administered 2020-10-12: 650 mg via ORAL
  Filled 2020-10-10: qty 2

## 2020-10-10 MED ORDER — SODIUM CHLORIDE 0.9 % IV SOLN: 2.0000 g | Freq: Two times a day (BID) | INTRAVENOUS | Status: DC

## 2020-10-10 MED ORDER — ALPRAZOLAM 0.25 MG PO TABS: 0.2500 mg | ORAL_TABLET | Freq: Two times a day (BID) | ORAL | Status: DC | PRN

## 2020-10-10 MED ORDER — LORAZEPAM 2 MG/ML IJ SOLN
0.5000 mg | Freq: Once | INTRAMUSCULAR | Status: AC
  Administered 2020-10-10: 0.5 mg via INTRAVENOUS
  Filled 2020-10-10: qty 1

## 2020-10-10 MED ORDER — EZETIMIBE 10 MG PO TABS
10.0000 mg | ORAL_TABLET | Freq: Every day | ORAL | Status: DC
  Administered 2020-10-12 – 2020-10-17 (×4): 10 mg via ORAL
  Filled 2020-10-10 (×7): qty 1

## 2020-10-10 MED ORDER — SODIUM CHLORIDE 0.9 % IV SOLN
500.0000 mg | INTRAVENOUS | Status: DC
  Administered 2020-10-11 (×2): 500 mg via INTRAVENOUS
  Filled 2020-10-10 (×2): qty 500

## 2020-10-10 MED ORDER — ENOXAPARIN SODIUM 40 MG/0.4ML IJ SOSY
40.0000 mg | PREFILLED_SYRINGE | Freq: Every day | INTRAMUSCULAR | Status: DC
  Administered 2020-10-11 (×2): 40 mg via SUBCUTANEOUS
  Filled 2020-10-10 (×2): qty 0.4

## 2020-10-10 MED ORDER — ALBUTEROL SULFATE (2.5 MG/3ML) 0.083% IN NEBU: 2.5000 mg | INHALATION_SOLUTION | RESPIRATORY_TRACT | Status: DC | PRN

## 2020-10-10 MED ORDER — AMLODIPINE BESYLATE 10 MG PO TABS
10.0000 mg | ORAL_TABLET | Freq: Every day | ORAL | Status: DC
  Administered 2020-10-12 – 2020-10-17 (×4): 10 mg via ORAL
  Filled 2020-10-10 (×7): qty 1

## 2020-10-10 MED ORDER — SODIUM CHLORIDE 0.9 % IV SOLN
2.0000 g | Freq: Once | INTRAVENOUS | Status: AC
  Administered 2020-10-10: 2 g via INTRAVENOUS
  Filled 2020-10-10: qty 2

## 2020-10-10 MED ORDER — VANCOMYCIN HCL 1000 MG/200ML IV SOLN: 1000.0000 mg | INTRAVENOUS | Status: DC

## 2020-10-10 MED ORDER — METHYLPREDNISOLONE SODIUM SUCC 40 MG IJ SOLR
40.0000 mg | Freq: Two times a day (BID) | INTRAMUSCULAR | Status: DC
  Administered 2020-10-11 – 2020-10-12 (×4): 40 mg via INTRAVENOUS
  Filled 2020-10-10 (×4): qty 1

## 2020-10-10 MED ORDER — LEVOTHYROXINE SODIUM 100 MCG PO TABS
100.0000 ug | ORAL_TABLET | Freq: Every day | ORAL | Status: DC
  Administered 2020-10-13 – 2020-10-17 (×4): 100 ug via ORAL
  Filled 2020-10-10 (×5): qty 1

## 2020-10-10 MED ORDER — ALBUTEROL SULFATE (2.5 MG/3ML) 0.083% IN NEBU
2.5000 mg | INHALATION_SOLUTION | RESPIRATORY_TRACT | Status: DC
  Administered 2020-10-10: 2.5 mg via RESPIRATORY_TRACT
  Filled 2020-10-10: qty 3

## 2020-10-10 MED ORDER — SODIUM CHLORIDE 0.9 % IV SOLN
2.0000 g | INTRAVENOUS | Status: DC
  Administered 2020-10-11: 2 g via INTRAVENOUS
  Filled 2020-10-10: qty 20

## 2020-10-10 MED ORDER — LATANOPROST 0.005 % OP SOLN
1.0000 [drp] | Freq: Every day | OPHTHALMIC | Status: DC
  Administered 2020-10-11 – 2020-10-16 (×7): 1 [drp] via OPHTHALMIC
  Filled 2020-10-10: qty 2.5

## 2020-10-10 MED ORDER — VANCOMYCIN HCL 1250 MG/250ML IV SOLN
1250.0000 mg | Freq: Once | INTRAVENOUS | Status: AC
  Administered 2020-10-10: 1250 mg via INTRAVENOUS
  Filled 2020-10-10: qty 250

## 2020-10-10 MED ORDER — BUSPIRONE HCL 10 MG PO TABS: 15.0000 mg | ORAL_TABLET | Freq: Four times a day (QID) | ORAL | Status: DC

## 2020-10-10 MED ORDER — ACETAMINOPHEN 650 MG RE SUPP: 650.0000 mg | Freq: Four times a day (QID) | RECTAL | Status: DC | PRN

## 2020-10-10 MED ORDER — FUROSEMIDE 10 MG/ML IJ SOLN
20.0000 mg | Freq: Once | INTRAMUSCULAR | Status: AC
  Administered 2020-10-10: 20 mg via INTRAVENOUS
  Filled 2020-10-10: qty 2

## 2020-10-10 NOTE — Progress Notes (Signed)
Pharmacy Antibiotic Note  [name] [name] is a 87 y.o. female admitted on 10/10/2020 with  infection of unknown source .  Pharmacy has been consulted for Cefepime and vancomycin dosing.  WBC wnl, SCr wnl, Tm 102.12F, CrCl ~ 51 mL/min  Plan: -Cefepime 2 gm IV Q 12 hours -Vancomycin 1250 mg IV load followed by Vancomycin 1000 mg IV Q 48 hrs. Goal AUC 400-550. Expected AUC: 442 SCr used: 1.15 -Monitor CBC, renal fx, cultures and clinical progress -Vanc levels as indicated      Temp (24hrs), Avg:101.1 F (38.4 C), Min:97.8 F (36.6 C), Max:102.9 F (39.4 C)  Recent Labs  Lab 10/10/20 1644 10/10/20 1845  WBC  --  5.1  CREATININE  --  0.85  LATICACIDVEN 1.1  --     CrCl cannot be calculated (Unknown ideal weight.).    Allergies  Allergen Reactions   Namenda [Memantine]    Trazodone And Nefazodone     Antimicrobials this admission: Cefepime 6/10 >>  Vancomycin 6/10 >>   Dose adjustments this admission:   Microbiology results: 6/10 BCx:  6/10 UCx:     Thank you for allowing pharmacy to be a part of this patient's care.  Vinnie Level, PharmD., BCPS, BCCCP Clinical Pharmacist Please refer to [facility] for unit-specific pharmacist

## 2020-10-10 NOTE — ED Provider Notes (Signed)
[facility] EMERGENCY DEPARTMENT Provider Note   CSN: [id number] Arrival date & time: 10/10/20  1546     History Chief Complaint  Patient presents with   Weakness    [name] [name] is a 87 y.o. female past medical history of carotid stenosis, CAD, dementia, hyperlipidemia, hypertension, ischemic cardiomyopathy who presents for evaluation of cough, fatigue, difficulty breathing, lethargy.  Patient with history of dementia.  She currently lives with her daughter and son checks on her every afternoon.  Daughter recently got a case of bronchitis and a few days ago, patient started having symptoms.  They report that normally with her dementia, she can be confused but normally get up and walk around by herself, do basic tasks by herself.  They state over the last day or so, patient has not been able to do what she normally has been able to do.  She has been more tired, fatigued, lethargic.  They have noticed a cough and feels like "she is rattling when she is breathing."  They have also noted her legs have been swollen.  They do report that sometimes she will walk around at night and states that she will get leg swelling from that but today when they were putting on pants, they noticed pants that usually fit were having difficulty covering over her leg secondary to swelling.  They state today, she has been more tired and really all she is wanted to do a sleep.  Cough is productive of phlegm.  She has been vaccinated for COVID.  EM LEVEL 5 CAVET DUE TO DEMENTIA  The history is provided by a relative.      Past Medical History:  Diagnosis Date   Carotid stenosis    a. Carotid US (10/15):  Bilateral ICA 1-39%   Coronary artery disease    a. NSTEMI >> LHC (02/11/14):  pLAD 95%, mLAD occl, CFX < 20%, pRCA 30%, EF 35%, ant and apical AK >> CABG   Dementia (HCC)    Glucose intolerance (impaired glucose tolerance)    a. A1c 6.1 (01/2014)   H/O non-ST elevation myocardial infarction  (NSTEMI)    01/2014   HLD (hyperlipidemia)    Hypertension    Ischemic cardiomyopathy    a. EF 35% by cath at time of NSTEMI >> b. Echo (10/15):  EF 50% to 55%. Wall motion was normal; Grade 1 diastolic dysfunction.  Mild AI.  Ascending aorta mildly dilated. Mild MR.  Mild LAE.  PA peak pressure: 46 mm Hg (S).    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   HLD (hyperlipidemia) 02/11/2014   Hypothyroidism 02/11/2014   Unstable angina (HCC) 02/10/2014    Past Surgical History:  Procedure Laterality Date   CESAREAN SECTION     CORONARY ARTERY BYPASS GRAFT N/A 02/15/2014   Procedure: CORONARY ARTERY BYPASS GRAFTING (CABG) x three,  using left internal mammary artery and right leg greater saphenous vein harvested endoscopically;  Surgeon: Alleen Borne, MD;  Location: [facility] OR;  Service: Open Heart Surgery;  Laterality:  N/A;   HIP ARTHROPLASTY Right 02/21/2020   Procedure: HEMIARTHROPLASTY ANTERIOR APPROACH;  Surgeon: Samson FredericSwinteck, Brian, MD;  Location: [facility] ORS;  Service: Orthopedics;  Laterality: Right;   LEFT HEART CATHETERIZATION WITH CORONARY ANGIOGRAM N/A 02/11/2014   Procedure: LEFT HEART CATHETERIZATION WITH CORONARY ANGIOGRAM;  Surgeon: Peter M SwazilandJordan, MD;  Location: [facility] CATH LAB;  Service: Cardiovascular;  Laterality: N/A;   TEE WITHOUT CARDIOVERSION N/A 02/15/2014   Procedure: TRANSESOPHAGEAL ECHOCARDIOGRAM (TEE);  Surgeon: Alleen BorneBryan K Bartle, MD;  [facility];  Service: Open Heart Surgery;  Laterality: N/A;     OB History   No obstetric  history on file.     Family History  Problem Relation Age of Onset   Stroke Mother    Hypertension Mother    Hypertension Sister    Diabetes Brother    Diabetes Sister    Heart attack Neg Hx    Breast cancer Neg Hx     Social History   Tobacco Use   Smoking status: Never   Smokeless tobacco: Never  Vaping Use   Vaping Use: Never used  Substance Use Topics   Alcohol use: No   Drug use: No    Home Medications Prior to Admission medications   Medication Sig Start Date End Date Taking? Authorizing Provider  acetaminophen (TYLENOL) 500 MG tablet Take 1,000 mg by mouth every 6 (six) hours as needed (pain).   Yes [provider]  ALPRAZolam (XANAX) 0.25 MG tablet TAKE 1 TABLET BY MOUTH TWICE A DAY AS NEEDED FOR ANXIETY Patient taking differently: Take 0.25 mg by mouth 2 (two) times daily as needed for anxiety. 09/19/20  Yes Fargo, Amy E, NP  amLODipine (NORVASC) 10 MG tablet Take 10 mg by mouth daily. 01/02/20  Yes [provider]  busPIRone (BUSPAR) 30 MG tablet Take 15 mg by mouth 4 (four) times daily.    Yes [provider]  Calcium-Magnesium-Vitamin D 600-40-500 MG-MG-UNIT TB24 Take 1 tablet by mouth daily.   Yes [provider]  Cholecalciferol (VITAMIN D3) 50 MCG (2000 UT) TABS Take 1 capsule by mouth daily.   Yes [provider]  doxycycline (VIBRA-TABS) 100 MG tablet Take 1 tablet (100 mg total) by mouth 2 (two) times daily. 10/08/20  Yes Sharon SellerEubanks, Jessica K, NP  ezetimibe (ZETIA) 10 MG tablet Take 10 mg by mouth daily.   Yes [provider]  latanoprost (XALATAN) 0.005 % ophthalmic solution Place 1 drop into the left eye at bedtime.    Yes [provider]  levothyroxine (SYNTHROID) 100 MCG tablet TAKE 1 TABLET BY MOUTH DAILY BEFORE BREAKFAST. Patient taking differently: Take 100 mcg by mouth daily before breakfast. 05/21/20  Yes Eubanks, Janene HarveyJessica K, NP  mirtazapine (REMERON) 15 MG tablet TAKE 1 TABLET BY MOUTH EVERYDAY  AT BEDTIME Patient taking differently: Take 15 mg by mouth at bedtime. 10/09/20  Yes Sharon SellerEubanks, Jessica K, NP  polyethylene glycol (MIRALAX / GLYCOLAX) 17 g packet Take 17 g by mouth daily. 02/28/20  Yes Darlin DropHall, Carole N, DO    Allergies    Namenda [memantine] and Trazodone and nefazodone  Review of Systems   Review of Systems  Unable to perform ROS: Dementia   Physical Exam Updated Vital Signs BP 114/90   Pulse 81   Temp (!) 102.7 F (39.3 C) (Oral)   Resp (!) 24   LMP  (LMP Unknown)   SpO2 92%   Physical Exam Vitals and nursing note reviewed.  Constitutional:      Appearance:  Normal appearance. She is well-developed.     Comments: Sleeping but easily awakens  HENT:     Head: Normocephalic and atraumatic.  Eyes:     General: Lids are normal.     Conjunctiva/sclera: Conjunctivae normal.     Pupils: Pupils are equal, round, and reactive to light.  Cardiovascular:     Rate and Rhythm: Normal rate and regular rhythm.     Pulses: Normal pulses.     Heart sounds: Normal heart sounds. No murmur heard.   No friction rub. No gallop.  Pulmonary:     Effort: Pulmonary effort is normal. Tachypnea present.     Breath sounds: Rhonchi and rales present.     Comments: Audible rhonchi present.  She has crackles noted at bilateral bases, right greater than left. Abdominal:     Palpations: Abdomen is soft. Abdomen is not rigid.     Tenderness: There is no abdominal tenderness. There is no guarding.     Comments: Abdomen is soft, non-distended, non-tender. No rigidity, No guarding. No peritoneal signs.  Musculoskeletal:        General: Normal range of motion.     Cervical back: Full passive range of motion without pain.     Comments: Non-pitting edema noted to BLE. No overlying warmth, erythema.   Skin:    General: Skin is warm and dry.     Capillary Refill: Capillary refill takes less than 2 seconds.  Neurological:     Mental Status: She is alert.     Comments: Follows some  commands Pleasantly demended.   Psychiatric:        Speech: Speech normal.    ED Results / Procedures / Treatments   Labs (all labs ordered are listed, but only abnormal results are displayed) Labs Reviewed  COMPREHENSIVE METABOLIC PANEL - Abnormal; Notable for the following components:      Result Value   Sodium 127 (*)    Chloride 94 (*)    All other components within normal limits  CBC WITH DIFFERENTIAL/PLATELET - Abnormal; Notable for the following components:   Hemoglobin 11.8 (*)    All other components within normal limits  URINALYSIS, ROUTINE W REFLEX MICROSCOPIC - Abnormal; Notable for the following components:   APPearance HAZY (*)    Hgb urine dipstick SMALL (*)    Ketones, ur 5 (*)    Bacteria, UA RARE (*)    All other components within normal limits  BRAIN NATRIURETIC PEPTIDE - Abnormal; Notable for the following components:   B Natriuretic Peptide 231.4 (*)    All other components within normal limits  RESP PANEL BY RT-PCR (FLU A&B, COVID) ARPGX2  CULTURE, BLOOD (ROUTINE X 2)  CULTURE, BLOOD (ROUTINE X 2)  URINE CULTURE  LACTIC ACID, PLASMA  LACTIC ACID, PLASMA  PROTIME-INR  APTT    EKG EKG Interpretation  Date/Time:  Friday October 10 2020 16:16:20 EDT Ventricular Rate:  81 PR Interval:  222 QRS Duration: 82 QT Interval:  362 QTC Calculation: 420 R Axis:   -53 Text Interpretation: Sinus rhythm with 1st degree A-V block with Premature atrial complexes Left anterior fascicular block Nonspecific T wave abnormality new 1st degree A-V block Confirmed by Gwyneth Sprout ([id number]) on 10/10/2020 5:39:42 PM  Radiology DG Chest Port 1 View  Result Date: 10/10/2020 CLINICAL DATA:  Sepsis. EXAM: PORTABLE CHEST 1 VIEW COMPARISON:  02/21/2020 FINDINGS: The heart is enlarged but stable. Moderate tortuosity and calcification of the thoracic aorta. Stable surgical changes from bypass surgery. No  acute pulmonary findings. No pleural effusions or pneumothorax. The bony  thorax is intact. IMPRESSION: Cardiac enlargement but no acute pulmonary findings. Electronically Signed   By: Rudie Meyer M.D.   On: 10/10/2020 18:55    Procedures Procedures   Medications Ordered in ED Medications  ceFEPIme (MAXIPIME) 2 g in sodium chloride 0.9 % 100 mL IVPB (2 g Intravenous New Bag/Given 10/10/20 2031)  vancomycin (VANCOREADY) IVPB 1250 mg/250 mL (has no administration in time range)  furosemide (LASIX) injection 20 mg (has no administration in time range)  acetaminophen (TYLENOL) tablet 1,000 mg (1,000 mg Oral Given 10/10/20 1819)  LORazepam (ATIVAN) injection 0.5 mg (0.5 mg Intravenous Given 10/10/20 2032)    ED Course  I have reviewed the triage vital signs and the nursing notes.  Pertinent labs & imaging results that were available during my care of the patient were reviewed by me and considered in my medical decision making (see chart for details).    MDM Rules/Calculators/A&P                          87 y.o. F who presents for evaluation of cough, SOB, increased lethargy. Daughter at home with similar symptoms. Patient with a history of dementia at baseline but is very active and family state they have noted a change in baseline. On initial ED arrival, patient is febrile and tachypneic. She is very sleepy but awakens. She does appear to be somewhat fluid overloaded as she does exhibit some swelling to both legs. Crackles noted at bases bilaterally. Consider infectious process such as PNA or covid. Also consider CHF. Code sepsis initiated given patient's fever and tachypnea. Patient started on broad spectrum antiobiotics. Will hold on full fluid bolus given that patient appears fluid overloaded and BP stable.   CBC shows no leukocytosis. Hgb stable at 11.8. COVID negative.  BNP is 231.4.  CMP shows sodium of 127, BUN and creatinine.  Lactic is 1.1. UA shows small Hgb, rare bacteria.   CXR shows cardiac enlargement.   Review of her records show she did have some  ischemic cardiomyopathy during her MI which resulted in EF of 35.  Her last echo was in 2015 which showed an EF of 50-55%.  Unclear of infectious source at this time.  On my exam, she does have crackles and is coughing.  Will cover for respiratory.  Additionally, concern for heart failure.  She has not had an echo since 2015.  Will need to come in for echo, diuresis.  Discussed patient with Dr. Toniann Fail (hospitalist) who accepts patient for admission.   Portions of this note were generated with Scientist, clinical (histocompatibility and immunogenetics). Dictation errors may occur despite best attempts at proofreading.   Final Clinical Impression(s) / ED Diagnoses Final diagnoses:  Fever, unspecified fever cause  Cough  Leg edema    Rx / DC Orders ED Discharge Orders     None        Rosana Hoes 10/10/20 2045    Gwyneth Sprout, MD 10/12/20 1601

## 2020-10-10 NOTE — Progress Notes (Signed)
Careteam: Patient Care Team: [name], NP as PCP - General (Geriatric Medicine) [name], MD as PCP - Cardiology (Cardiology)  Seen by: Hazle Nordmann, AGNP-C  PLACE OF SERVICE:  [facility]  Advanced Directive information    Allergies  Allergen Reactions   Namenda [Memantine]    Trazodone And Nefazodone     Chief Complaint  Patient presents with   Acute Visit    Wheezing, cough runny nose, congestion and extremely weak. Rattling in her chest. Patient has extreme fatigue. Symptoms started Monday 10/06/2020. Sleep pattern is off this week. Patient has shortness of breath.     HPI: Patient is a 87 y.o. female seen today for acute visit for worsening shortness of breath. Two days ago she was seen by video visit for malaise, nasal congestion and cough. Home covid test was negative. She was prescribed doxycycline.   Today she presents with her daughter for worsening shortness of breath. Symptoms began last night. Daughter reports she is more lethargic, confused, and cough sounds like a rattling. Doxycycline started two days ago as advised.   During our encounter she was aroused to voice, was not able to follow instruction. I have advised daughter to take her to [facility] ED for further evaluation.    Review of Systems:  Review of Systems  Constitutional:  Positive for malaise/fatigue. Negative for chills and fever.  HENT:  Positive for congestion. Negative for sore throat.   Respiratory:  Positive for cough, sputum production, shortness of breath and wheezing.   Cardiovascular:  Negative for chest pain and leg swelling.  Musculoskeletal:  Negative for myalgias.  Neurological:  Positive for weakness.  Psychiatric/Behavioral:  Negative for depression. The patient is not nervous/anxious.        Confusion   Past Medical History:  Diagnosis Date   Carotid stenosis    a. Carotid US (10/15):  Bilateral ICA 1-39%   Coronary artery disease    a. NSTEMI >> LHC  (02/11/14):  pLAD 95%, mLAD occl, CFX < 20%, pRCA 30%, EF 35%, ant and apical AK >> CABG   Dementia (HCC)    Glucose intolerance (impaired glucose tolerance)    a. A1c 6.1 (01/2014)   H/O non-ST elevation myocardial infarction (NSTEMI)    01/2014   HLD (hyperlipidemia)    Hypertension    Ischemic cardiomyopathy    a. EF 35% by cath at time of NSTEMI >> b. Echo (10/15):  EF 50% to 55%. Wall motion was normal; Grade 1 diastolic dysfunction.  Mild AI.  Ascending aorta mildly dilated. Mild MR.  Mild LAE.  PA peak pressure: 46 mm Hg (S).   Past Surgical History:  Procedure Laterality Date   CESAREAN SECTION     CORONARY ARTERY BYPASS GRAFT N/A 02/15/2014   Procedure: CORONARY ARTERY BYPASS GRAFTING (CABG) x three,  using left internal mammary artery and right leg greater saphenous vein harvested endoscopically;  Surgeon: Alleen Borne, MD;  Location: [facility] OR;  Service: Open Heart Surgery;  Laterality: N/A;   HIP ARTHROPLASTY Right 02/21/2020   Procedure: HEMIARTHROPLASTY ANTERIOR APPROACH;  Surgeon: Samson Frederic, MD;  Location: [facility] ORS;  Service: Orthopedics;  Laterality: Right;   LEFT HEART CATHETERIZATION WITH CORONARY ANGIOGRAM N/A 02/11/2014   Procedure: LEFT HEART CATHETERIZATION WITH CORONARY ANGIOGRAM;  Surgeon: Peter M Swaziland, MD;  Location: [facility] CATH LAB;  Service: Cardiovascular;  Laterality: N/A;   TEE WITHOUT CARDIOVERSION N/A 02/15/2014   Procedure: TRANSESOPHAGEAL ECHOCARDIOGRAM (TEE);  Surgeon: Payton Doughty  Laneta Simmers, MD;  Location: [facility] OR;  Service: Open Heart Surgery;  Laterality: N/A;   Social History:   reports that she has never smoked. She has never used smokeless tobacco. She reports that she does not drink alcohol and does not use drugs.  Family History  Problem Relation Age of Onset   Stroke Mother    Hypertension Mother    Hypertension Sister    Diabetes Brother    Diabetes Sister    Heart attack Neg Hx    Breast cancer Neg Hx     Medications: Patient's Medications   New Prescriptions   No medications on file  Previous Medications   ACETAMINOPHEN (TYLENOL) 500 MG TABLET    Take 1,000 mg by mouth every 6 (six) hours as needed (pain).   ALPRAZOLAM (XANAX) 0.25 MG TABLET    TAKE 1 TABLET BY MOUTH TWICE A DAY AS NEEDED FOR ANXIETY   AMLODIPINE (NORVASC) 10 MG TABLET    Take 10 mg by mouth daily.   BUSPIRONE (BUSPAR) 30 MG TABLET    Take 15 mg by mouth 4 (four) times daily.    CALCIUM-MAGNESIUM-VITAMIN D 600-40-500 MG-MG-UNIT TB24    Take 1 tablet by mouth daily.   CHOLECALCIFEROL (VITAMIN D3) 50 MCG (2000 UT) TABS    Take 1 capsule by mouth daily.   DOXYCYCLINE (VIBRA-TABS) 100 MG TABLET    Take 1 tablet (100 mg total) by mouth 2 (two) times daily.   EZETIMIBE (ZETIA) 10 MG TABLET    Take 10 mg by mouth daily.   LATANOPROST (XALATAN) 0.005 % OPHTHALMIC SOLUTION    Place 1 drop into the left eye at bedtime.    LEVOTHYROXINE (SYNTHROID) 100 MCG TABLET    TAKE 1 TABLET BY MOUTH DAILY BEFORE BREAKFAST.   MIRTAZAPINE (REMERON) 15 MG TABLET    TAKE 1 TABLET BY MOUTH EVERYDAY AT BEDTIME   POLYETHYLENE GLYCOL (MIRALAX / GLYCOLAX) 17 G PACKET    Take 17 g by mouth daily.  Modified Medications   No medications on file  Discontinued Medications   No medications on file    Physical Exam:  There were no vitals filed for this visit. There is no height or weight on file to calculate BMI. Wt Readings from Last 3 Encounters:  09/17/20 153 lb 6.4 oz (69.6 kg)  06/13/20 149 lb (67.6 kg)  05/21/20 144 lb 12.8 oz (65.7 kg)    Physical Exam Vitals reviewed.  Constitutional:      General: She is not in acute distress. HENT:     Head: Normocephalic.     Nose: Congestion present.  Cardiovascular:     Rate and Rhythm: Normal rate and regular rhythm.     Pulses: Normal pulses.     Heart sounds: Normal heart sounds.  Pulmonary:     Effort: Tachypnea present.     Breath sounds: Decreased air movement present. Examination of the right-lower field reveals decreased  breath sounds. Examination of the left-lower field reveals decreased breath sounds. Decreased breath sounds present.  Neurological:     Mental Status: She is alert. She is disoriented.     Motor: Weakness present.     Gait: Gait abnormal.     Comments: wheelchair  Psychiatric:        Attention and Perception: She is inattentive.        Mood and Affect: Affect is flat.    Labs reviewed: Basic Metabolic Panel: Recent Labs    02/15/20 1136 02/21/20 0404 02/27/20 0300  05/21/20 1218 06/13/20 1628  NA 142   < > 139 140 139  K 4.3   < > 3.5 4.1 4.2  CL 107   < > 109 106 104  CO2 28   < > 22 25 26   GLUCOSE 83   < > 102* 95 94  BUN 10   < > 14 12 16   CREATININE 0.94*   < > 0.76 0.88 1.00*  CALCIUM 10.2   < > 9.2 10.2 10.3  TSH 0.51  --   --  2.83  --    < > = values in this interval not displayed.   Liver Function Tests: Recent Labs    02/25/20 0337 02/26/20 0823 02/27/20 0300 05/21/20 1218 06/13/20 1628  AST 16 14* 16 13 13   ALT 11 10 12 8 8   ALKPHOS 29* 31* 35*  --   --   BILITOT 0.9 0.8 0.9 0.3 0.3  PROT 5.7* 6.1* 6.7 8.1 7.9  ALBUMIN 3.0* 3.1* 3.4*  --   --    No results for input(s): LIPASE, AMYLASE in the last 8760 hours. No results for input(s): AMMONIA in the last 8760 hours. CBC: Recent Labs    02/21/20 0404 02/22/20 0316 02/25/20 0337 05/21/20 1218 06/13/20 1628  WBC 8.2   < > 6.6 6.2 6.8  NEUTROABS 6.1  --   --  3,695 3,992  HGB 12.9   < > 10.0* 12.3 12.3  HCT 39.5   < > 30.5* 37.2 37.1  MCV 91.0   < > 91.9 87.3 86.1  PLT 196   < > 166 283 271   < > = values in this interval not displayed.   Lipid Panel: Recent Labs    02/15/20 1136 05/21/20 1218  CHOL 177 181  HDL 67 75  LDLCALC 96 86  TRIG 49 105  CHOLHDL 2.6 2.4   TSH: Recent Labs    02/15/20 1136 05/21/20 1218  TSH 0.51 2.83   A1C: Lab Results  Component Value Date   HGBA1C 6.1 (H) 02/11/2014     Assessment/Plan 1. Shortness of breath - breathing tachypneic, O2 sat 91 %,  lethargic, does not follow commands, sob began last night - doxy started 2 days ago for cold symptoms - home covid test negative 06/08 - advised to report to ED for worsening SOB  Total time: 15 minutes.    Next appt: Visit date not found  Maki Hege 02/17/20  [facility] [phone]

## 2020-10-10 NOTE — H&P (Addendum)
History and Physical    [name] [name] JXB:[id number] DOB: [birth year] DOA: 10/10/2020  PCP: Sharon SellerEubanks, Jessica K, NP  Patient coming from: Home.  Chief Complaint: Shortness of breath.  Most of the history is obtained from patient's son and patient's daughter.  Patient has advanced dementia.  HPI: [name] [name] is a 87 y.o. female with history of advanced dementia, CAD status post CABG, resolved ischemic cardiomyopathy after CABG, hypertension has been experiencing shortness of breath for the last 2 to 3 days had video visit with her primary care physician about 3 days ago was prescribed Mucinex and doxycycline.  Patient started taking this today.  Became more short of breath yesterday when EMS was called and since parameters were in acceptable range patient was advised to follow-up with primary care physician today and patient was found to be lethargic and wheezing was advised to come to the ER.  As per patient's son patient has been eating but not regular self.  Has been having cough slightly productive but patient not producing anything given the dementia.  Patient's son and daughter were sick last week with features of bronchitis.  ED Course: In the ER patient was febrile with temperature 102 F diffusely wheezing chest x-ray does not show any infiltrates labs show BNP of 231 lactic acid was normal sodium 127 COVID test was negative.  Patient was started on empiric antibiotics for possible pneumonia and also was given 1 dose of Lasix for possible CHF.  Review of Systems: As per HPI, rest all negative.   Past Medical History:  Diagnosis Date   Carotid stenosis    a. Carotid US (10/15):  Bilateral ICA 1-39%   Coronary artery disease    a. NSTEMI >> LHC (02/11/14):  pLAD 95%, mLAD occl, CFX < 20%, pRCA 30%, EF 35%, ant and apical AK >> CABG   Dementia (HCC)    Glucose intolerance (impaired glucose tolerance)    a. A1c 6.1 (01/2014)   H/O non-ST elevation myocardial infarction (NSTEMI)     01/2014   HLD (hyperlipidemia)    Hypertension    Ischemic cardiomyopathy    a. EF 35% by cath at time of NSTEMI >> b. Echo (10/15):  EF 50% to 55%. Wall motion was normal; Grade 1 diastolic dysfunction.  Mild AI.  Ascending aorta mildly dilated. Mild MR.  Mild LAE.  PA peak pressure: 46 mm Hg (S).    Past Surgical History:  Procedure Laterality Date   CESAREAN SECTION     CORONARY ARTERY BYPASS GRAFT N/A 02/15/2014   Procedure: CORONARY ARTERY BYPASS GRAFTING (CABG) x three,  using left internal mammary artery and right leg greater saphenous vein harvested endoscopically;  Surgeon: Alleen BorneBryan K Bartle, MD;  Location: [facility] OR;  Service: Open Heart Surgery;  Laterality: N/A;   HIP ARTHROPLASTY Right 02/21/2020   Procedure: HEMIARTHROPLASTY ANTERIOR APPROACH;  Surgeon: Samson FredericSwinteck, Brian, MD;  Location: [facility] ORS;  Service: Orthopedics;  Laterality: Right;   LEFT HEART CATHETERIZATION WITH CORONARY ANGIOGRAM N/A 02/11/2014   Procedure: LEFT HEART CATHETERIZATION WITH CORONARY ANGIOGRAM;  Surgeon: Peter M SwazilandJordan, MD;  [facility];  Service: Cardiovascular;  Laterality: N/A;   TEE WITHOUT CARDIOVERSION N/A 02/15/2014   Procedure: TRANSESOPHAGEAL ECHOCARDIOGRAM (TEE);  Surgeon: Alleen BorneBryan K Bartle, MD;  [facility];  Service: Open Heart Surgery;  Laterality: N/A;     reports that she has never smoked. She has never used smokeless tobacco. She reports that she does not drink alcohol and does not use drugs.  Allergies  Allergen Reactions   Namenda [Memantine]    Trazodone And Nefazodone     Family History  Problem Relation Age of Onset   Stroke Mother    Hypertension Mother    Hypertension Sister    Diabetes Brother    Diabetes Sister    Heart attack Neg Hx    Breast cancer Neg Hx     Prior to Admission medications   Medication Sig Start Date End Date Taking? Authorizing Provider  acetaminophen (TYLENOL) 500 MG tablet Take 1,000 mg by mouth every 6 (six) hours as needed (pain).   Yes  [provider]  ALPRAZolam (XANAX) 0.25 MG tablet TAKE 1 TABLET BY MOUTH TWICE A DAY AS NEEDED FOR ANXIETY Patient taking differently: Take 0.25 mg by mouth 2 (two) times daily as needed for anxiety. 09/19/20  Yes [place], Amy E, NP  amLODipine (NORVASC) 10 MG tablet Take 10 mg by mouth daily. 01/02/20  Yes [provider]  busPIRone (BUSPAR) 30 MG tablet Take 15 mg by mouth 4 (four) times daily.    Yes [provider]  Calcium-Magnesium-Vitamin D 600-40-500 MG-MG-UNIT TB24 Take 1 tablet by mouth daily.   Yes [provider]  Cholecalciferol (VITAMIN D3) 50 MCG (2000 UT) TABS Take 1 capsule by mouth daily.   Yes [provider]  doxycycline (VIBRA-TABS) 100 MG tablet Take 1 tablet (100 mg total) by mouth 2 (two) times daily. 10/08/20  Yes [name], NP  ezetimibe (ZETIA) 10 MG tablet Take 10 mg by mouth daily.   Yes [provider]  latanoprost (XALATAN) 0.005 % ophthalmic solution Place 1 drop into the left eye at bedtime.    Yes [provider]  levothyroxine (SYNTHROID) 100 MCG tablet TAKE 1 TABLET BY MOUTH DAILY BEFORE BREAKFAST. Patient taking differently: Take 100 mcg by mouth daily before breakfast. 05/21/20  Yes Eubanks, Janene Harvey, NP  mirtazapine (REMERON) 15 MG tablet TAKE 1 TABLET BY MOUTH EVERYDAY AT BEDTIME Patient taking differently: Take 15 mg by mouth at bedtime. 10/09/20  Yes [name], NP  polyethylene glycol (MIRALAX / GLYCOLAX) 17 g packet Take 17 g by mouth daily. 02/28/20  Yes Darlin Drop, DO    Physical Exam: Constitutional: Moderately built and nourished. Vitals:   10/10/20 1912 10/10/20 2000 10/10/20 2030 10/10/20 2214  BP: 132/62 (!) 147/131 114/90   Pulse: 68 81    Resp: 17 (!) 25 (!) 24   Temp:    99.1 F (37.3 C)  TempSrc:    Oral  SpO2: 98% 92%     Eyes: Anicteric no pallor. ENMT: No discharge from the ears eyes nose and mouth. Neck: No mass felt.  No neck rigidity.  No JVD  appreciated. Respiratory: Bilateral expiratory wheeze and no crepitations. Cardiovascular: S1-S2 heard. Abdomen: Soft nontender bowel sound present. Musculoskeletal: No edema. Skin: No rash. Neurologic: Patient is mildly lethargic but arousable follows questions but appears confused.  Moving all extremities. Psychiatric: Lethargic.   Labs on Admission: I have personally reviewed following labs and imaging studies  CBC: Recent Labs  Lab 10/10/20 1845  WBC 5.1  NEUTROABS 4.0  HGB 11.8*  HCT 36.0  MCV 86.3  PLT 198   Basic Metabolic Panel: Recent Labs  Lab 10/10/20 1845  NA 127*  K 3.7  CL 94*  CO2 22  GLUCOSE 98  BUN 10  CREATININE 0.85  CALCIUM 9.2   GFR: CrCl cannot be calculated (Unknown ideal weight.). Liver Function Tests: Recent  Labs  Lab 10/10/20 1845  AST 26  ALT 14  ALKPHOS 41  BILITOT 0.5  PROT 7.6  ALBUMIN 3.8   No results for input(s): LIPASE, AMYLASE in the last 168 hours. No results for input(s): AMMONIA in the last 168 hours. Coagulation Profile: Recent Labs  Lab 10/10/20 1845  INR 1.1   Cardiac Enzymes: No results for input(s): CKTOTAL, CKMB, CKMBINDEX, TROPONINI in the last 168 hours. BNP (last 3 results) No results for input(s): PROBNP in the last 8760 hours. HbA1C: No results for input(s): HGBA1C in the last 72 hours. CBG: No results for input(s): GLUCAP in the last 168 hours. Lipid Profile: No results for input(s): CHOL, HDL, LDLCALC, TRIG, CHOLHDL, LDLDIRECT in the last 72 hours. Thyroid Function Tests: No results for input(s): TSH, T4TOTAL, FREET4, T3FREE, THYROIDAB in the last 72 hours. Anemia Panel: No results for input(s): VITAMINB12, FOLATE, FERRITIN, TIBC, IRON, RETICCTPCT in the last 72 hours. Urine analysis:    Component Value Date/Time   COLORURINE YELLOW 10/10/2020 1613   APPEARANCEUR HAZY (A) 10/10/2020 1613   LABSPEC 1.010 10/10/2020 1613   PHURINE 5.0 10/10/2020 1613   GLUCOSEU NEGATIVE 10/10/2020 1613    HGBUR SMALL (A) 10/10/2020 1613   BILIRUBINUR NEGATIVE 10/10/2020 1613   BILIRUBINUR Negative 09/21/2019 1635   KETONESUR 5 (A) 10/10/2020 1613   PROTEINUR NEGATIVE 10/10/2020 1613   UROBILINOGEN 0.2 09/21/2019 1635   NITRITE NEGATIVE 10/10/2020 1613   LEUKOCYTESUR NEGATIVE 10/10/2020 1613   Sepsis Labs: @LABRCNTIP (procalcitonin:4,lacticidven:4) ) Recent Results (from the past 240 hour(s))  Resp Panel by RT-PCR (Flu A&B, Covid) Nasopharyngeal Swab     Status: None   Collection Time: 10/10/20  4:24 PM   Specimen: Nasopharyngeal Swab; Nasopharyngeal(NP) swabs in vial transport medium  Result Value Ref Range Status   SARS Coronavirus 2 by RT PCR NEGATIVE NEGATIVE Final    Comment: (NOTE) SARS-CoV-2 target nucleic acids are NOT DETECTED.  The SARS-CoV-2 RNA is generally detectable in upper respiratory specimens during the acute phase of infection. The lowest concentration of SARS-CoV-2 viral copies this assay can detect is 138 copies/mL. A negative result does not preclude SARS-Cov-2 infection and should not be used as the sole basis for treatment or other patient management decisions. A negative result may occur with  improper specimen collection/handling, submission of specimen other than nasopharyngeal swab, presence of viral mutation(s) within the areas targeted by this assay, and inadequate number of viral copies(<138 copies/mL). A negative result must be combined with clinical observations, patient history, and epidemiological information. The expected result is Negative.  Fact Sheet for Patients:  12/10/20  Fact Sheet for Healthcare Providers:  BloggerCourse.com  This test is no t yet approved or cleared by the [place] FDA and  has been authorized for detection and/or diagnosis of SARS-CoV-2 by FDA under an Emergency Use Authorization (EUA). This EUA will remain  in effect (meaning this test can be used) for  the duration of the COVID-19 declaration under Section 564(b)(1) of the Act, 21 U.S.C.section 360bbb-3(b)(1), unless the authorization is terminated  or revoked sooner.       Influenza A by PCR NEGATIVE NEGATIVE Final   Influenza B by PCR NEGATIVE NEGATIVE Final    Comment: (NOTE) The Xpert Xpress SARS-CoV-2/FLU/RSV plus assay is intended as an aid in the diagnosis of influenza from Nasopharyngeal swab specimens and should not be used as a sole basis for treatment. Nasal washings and aspirates are unacceptable for Xpert Xpress SARS-CoV-2/FLU/RSV testing.  Fact Sheet for Patients: [place]  Fact Sheet for Healthcare Providers: SeriousBroker.it  This test is not yet approved or cleared by the [place] FDA and has been authorized for detection and/or diagnosis of SARS-CoV-2 by FDA under an Emergency Use Authorization (EUA). This EUA will remain in effect (meaning this test can be used) for the duration of the COVID-19 declaration under Section 564(b)(1) of the Act, 21 U.S.C. section 360bbb-3(b)(1), unless the authorization is terminated or revoked.  Performed at [facility], [address] [place], [place] [address]      Radiological Exams on Admission: DG Chest Port 1 View  Result Date: 10/10/2020 CLINICAL DATA:  Sepsis. EXAM: PORTABLE CHEST 1 VIEW COMPARISON:  02/21/2020 FINDINGS: The heart is enlarged but stable. Moderate tortuosity and calcification of the thoracic aorta. Stable surgical changes from bypass surgery. No acute pulmonary findings. No pleural effusions or pneumothorax. The bony thorax is intact. IMPRESSION: Cardiac enlargement but no acute pulmonary findings. Electronically Signed   By: Rudie Meyer M.D.   On: 10/10/2020 18:55    EKG: Independently reviewed.  Normal sinus rhythm with first-degree AV block.  Assessment/Plan Principal Problem:   Acute bronchitis Active Problems:   S/P  CABG x 3   Dementia with behavioral disturbance (HCC)   Acute CHF (congestive heart failure) (HCC)    Acute bronchitis -given the patient has diffuse wheezing and also patient's family members including son and daughter has similar symptoms likely viral bronchitis.  However since patient has other comorbidities will keep patient on empiric antibiotics for now until we get blood cultures results.  I will add steroids and nebulizer.  Closely monitor. History of CAD status post CABG and resolved ischemic cardiomyopathy as per the notes per cardiology and 2D echo done in 2015.  Did receive 1 dose of Lasix.  Closely observe.  Continue antiplatelet agents Zetia. Hypertension on amlodipine. Lethargy/acute encephalopathy likely from acute bronchitis/febrile illness.  Will hold sedatives including Xanax mirtazapine and BuSpar for now.  Check ABG.  Patient is a DNR. Hyponatremia cause not clear.  Will trend metabolic panel check urine studies.  Check TSH. Hypothyroidism on Synthroid.  Check TSH. History of dementia.   DVT prophylaxis: Lovenox. Code Status: DNR confirmed with patient's son. Family Communication: Patient's son and daughter. Disposition Plan: Home when stable. Consults called: None. Admission status: Observation.   Eduard Clos MD [facility] Pager [phone].  If 7PM-7AM, please contact night-coverage [email or web address] Password [email or web address]  10/10/2020, 10:30 PM

## 2020-10-10 NOTE — ED Provider Notes (Signed)
Emergency Medicine Provider Triage Evaluation Note  [name] [name] , a 87 y.o. female  was evaluated in triage.  Pt complains of cough, fever and generalized weakness. Lives with fam who is currently sick with bronchitis. Started wheezing last night. No vomiting/diarrhea.  Review of Systems  Positive: Cough, fever, generalized weakness Negative: Vomiting, diarrhea  Physical Exam  LMP  (LMP Unknown)  Gen:   Awake, no distress   Resp:  Normal effort  MSK:   Moves extremities without difficulty  Other:  Diffuse wheezing, decreased breath sounds, ble edema  Medical Decision Making  Medically screening exam initiated at 4:06 PM.  Appropriate orders placed.  [name] [name] was informed that the remainder of the evaluation will be completed by another provider, this initial triage assessment does not replace that evaluation, and the importance of remaining in the ED until their evaluation is complete.    [name] [name] 10/10/20 1612    Eber Hong, MD 10/10/20 [phone]

## 2020-10-10 NOTE — ED Triage Notes (Signed)
Pt with hx of dementia arrives with caregiver after [facility] for eval of generalized weakness, cough, lethargy, and BLE swelling. EMS called to house last night d/t wheezing, but did not transport. However pt unable to stand today, but she can usually get to the bathroom on her own. Febrile in triage.

## 2020-10-10 NOTE — ED Notes (Signed)
Report attempted 

## 2020-10-11 DIAGNOSIS — J209 Acute bronchitis, unspecified: Secondary | ICD-10-CM | POA: Diagnosis not present

## 2020-10-11 DIAGNOSIS — R600 Localized edema: Secondary | ICD-10-CM | POA: Diagnosis not present

## 2020-10-11 DIAGNOSIS — Z951 Presence of aortocoronary bypass graft: Secondary | ICD-10-CM

## 2020-10-11 LAB — BASIC METABOLIC PANEL
Anion gap: 11 (ref 5–15)
BUN: 11 mg/dL (ref 8–23)
CO2: 23 mmol/L (ref 22–32)
Calcium: 9.2 mg/dL (ref 8.9–10.3)
Chloride: 98 mmol/L (ref 98–111)
Creatinine, Ser: 0.98 mg/dL (ref 0.44–1.00)
GFR, Estimated: 56 mL/min — ABNORMAL LOW (ref >60)
Glucose, Bld: 112 mg/dL — ABNORMAL HIGH (ref 70–99)
Potassium: 3.1 mmol/L — ABNORMAL LOW (ref 3.5–5.1)
Sodium: 132 mmol/L — ABNORMAL LOW (ref 135–145)

## 2020-10-11 LAB — TSH: TSH: 5.74 u[IU]/mL — ABNORMAL HIGH (ref 0.350–4.500)

## 2020-10-11 LAB — OSMOLALITY, URINE: Osmolality, Ur: 236 mOsm/kg — ABNORMAL LOW (ref 300–900)

## 2020-10-11 LAB — CBC
HCT: 39.3 % (ref 36.0–46.0)
Hemoglobin: 13.2 g/dL (ref 12.0–15.0)
MCH: 28.5 pg (ref 26.0–34.0)
MCHC: 33.6 g/dL (ref 30.0–36.0)
MCV: 84.9 fL (ref 80.0–100.0)
Platelets: 192 10*3/uL (ref 150–400)
RBC: 4.63 MIL/uL (ref 3.87–5.11)
RDW: 15 % (ref 11.5–15.5)
WBC: 5 10*3/uL (ref 4.0–10.5)
nRBC: 0 % (ref 0.0–0.2)

## 2020-10-11 LAB — GLUCOSE, CAPILLARY
Glucose-Capillary: 137 mg/dL — ABNORMAL HIGH (ref 70–99)
Glucose-Capillary: 124 mg/dL — ABNORMAL HIGH (ref 70–99)
Glucose-Capillary: 127 mg/dL — ABNORMAL HIGH (ref 70–99)
Glucose-Capillary: 168 mg/dL — ABNORMAL HIGH (ref 70–99)

## 2020-10-11 LAB — SODIUM, URINE, RANDOM: Sodium, Ur: 66 mmol/L

## 2020-10-11 LAB — STREP PNEUMONIAE URINARY ANTIGEN: Strep Pneumo Urinary Antigen: NEGATIVE

## 2020-10-11 MED ORDER — LORAZEPAM 2 MG/ML IJ SOLN
0.2500 mg | Freq: Once | INTRAMUSCULAR | Status: AC
  Administered 2020-10-11: 0.25 mg via INTRAVENOUS
  Filled 2020-10-11: qty 1

## 2020-10-11 MED ORDER — IPRATROPIUM-ALBUTEROL 0.5-2.5 (3) MG/3ML IN SOLN: 3.0000 mL | Freq: Two times a day (BID) | RESPIRATORY_TRACT | Status: DC

## 2020-10-11 MED ORDER — IPRATROPIUM BROMIDE 0.02 % IN SOLN: 0.5000 mg | Freq: Four times a day (QID) | RESPIRATORY_TRACT | Status: DC

## 2020-10-11 MED ORDER — ALBUTEROL SULFATE (2.5 MG/3ML) 0.083% IN NEBU: 2.5000 mg | INHALATION_SOLUTION | Freq: Four times a day (QID) | RESPIRATORY_TRACT | Status: DC

## 2020-10-11 MED ORDER — LORAZEPAM 2 MG/ML IJ SOLN
1.0000 mg | Freq: Once | INTRAMUSCULAR | Status: AC
  Administered 2020-10-11: 1 mg via INTRAVENOUS
  Filled 2020-10-11: qty 1

## 2020-10-11 MED ORDER — ALBUTEROL SULFATE (2.5 MG/3ML) 0.083% IN NEBU
2.5000 mg | INHALATION_SOLUTION | Freq: Three times a day (TID) | RESPIRATORY_TRACT | Status: DC
  Administered 2020-10-11: 2.5 mg via RESPIRATORY_TRACT
  Filled 2020-10-11: qty 3

## 2020-10-11 MED ORDER — POTASSIUM CHLORIDE 10 MEQ/100ML IV SOLN
10.0000 meq | INTRAVENOUS | Status: AC
  Administered 2020-10-11 (×3): 10 meq via INTRAVENOUS
  Filled 2020-10-11: qty 100

## 2020-10-11 MED ORDER — HALOPERIDOL LACTATE 5 MG/ML IJ SOLN
2.0000 mg | Freq: Once | INTRAMUSCULAR | Status: AC | PRN
  Administered 2020-10-11: 2 mg via INTRAVENOUS
  Filled 2020-10-11: qty 1

## 2020-10-11 MED ORDER — IPRATROPIUM-ALBUTEROL 0.5-2.5 (3) MG/3ML IN SOLN
3.0000 mL | Freq: Four times a day (QID) | RESPIRATORY_TRACT | Status: DC
  Administered 2020-10-11 – 2020-10-12 (×6): 3 mL via RESPIRATORY_TRACT
  Filled 2020-10-11 (×6): qty 3

## 2020-10-11 NOTE — Progress Notes (Addendum)
[facility]  PROGRESS NOTE  [name] [name] VHQ:[id number] DOB: 11/25/1932 DOA: 10/10/2020 PCP: [name], NP   Brief HPI:   87 year old female with history of advanced dementia, CAD s/p CABG, resolved ischemic cardiomyopathy after CABG, hypertension who has been experiencing shortness of breath for past 2 to 3 days.  In the ED she was found to be febrile with temperature 102 degrees Fahrenheit, diffuse wheezing, chest x-ray did not show infiltrates, lab work showed BNP of 231, lactic acid was normal.  Patient was started on empiric antibiotics for possible pneumonia and also received 1 dose of Lasix for possible CHF.    Subjective   This morning patient developed diarrhea, denies shortness of breath.   Assessment/Plan:     Acute bronchitis -Presented with wheezing -Continue Solu-Medrol 40 mg IV every 12 hour -DuoNeb nebulizers every 6 hours -We will discontinue ceftriaxone; continue Zithromax   Hypothyroidism -Continue Synthroid   Hypertension -Blood pressure is stable -Continue amlodipine   History of CAD s/p CABG -Ischemic cardiomyopathy resolved after CABG -Continue Zetia   Hyponatremia -Sodium improved to 132    Scheduled medications:    amLODipine  10 mg Oral Daily   enoxaparin (LOVENOX) injection  40 mg Subcutaneous QHS   ezetimibe  10 mg Oral Daily   ipratropium-albuterol  3 mL Nebulization BID   latanoprost  1 drop Left Eye QHS   levothyroxine  100 mcg Oral QAC breakfast   methylPREDNISolone (SOLU-MEDROL) injection  40 mg Intravenous Q12H         Data Reviewed:   CBG:  Recent Labs  Lab 10/10/20 2352 10/11/20 0543 10/11/20 1245  GLUCAP 98 137* 124*    SpO2: 93 %    Vitals:   10/11/20 0511 10/11/20 0800 10/11/20 0837 10/11/20 1236  BP: (!) 176/89  (!) 157/95 (!) 142/93  Pulse: 78  83 81  Resp: 20  19 20   Temp: 98 F (36.7 C)  98.1 F (36.7 C)   TempSrc: Oral  Oral Oral  SpO2: 95% 96% 96% 93%  Weight:          Intake/Output Summary (Last 24 hours) at 10/11/2020 1403 Last data filed at 10/11/2020 1247 Gross per 24 hour  Intake 0 ml  Output 550 ml  Net -550 ml    06/09 1901 - 06/11 0700 In: 0  Out: 300 [Urine:300]  Filed Weights   10/10/20 2338 10/11/20 0308  Weight: 71 kg 70.8 kg    CBC:  Recent Labs  Lab 10/10/20 1845 10/11/20 0026  WBC 5.1 5.0  HGB 11.8* 13.2  HCT 36.0 39.3  PLT 198 192  MCV 86.3 84.9  MCH 28.3 28.5  MCHC 32.8 33.6  RDW 15.3 15.0  LYMPHSABS 0.8  --   MONOABS 0.3  --   EOSABS 0.0  --   BASOSABS 0.0  --     Complete metabolic panel:  Recent Labs  Lab 10/10/20 1614 10/10/20 1644 10/10/20 1845 10/10/20 1921 10/11/20 0026  NA  --   --  127*  --  132*  K  --   --  3.7  --  3.1*  CL  --   --  94*  --  98  CO2  --   --  22  --  23  GLUCOSE  --   --  98  --  112*  BUN  --   --  10  --  11  CREATININE  --   --  0.85  --  0.98  CALCIUM  --   --  9.2  --  9.2  AST  --   --  26  --   --   ALT  --   --  14  --   --   ALKPHOS  --   --  41  --   --   BILITOT  --   --  0.5  --   --   ALBUMIN  --   --  3.8  --   --   LATICACIDVEN  --  1.1  --  1.0  --   INR  --   --  1.1  --   --   TSH  --   --   --   --  5.740*  BNP 231.4*  --   --   --   --     No results for input(s): LIPASE, AMYLASE in the last 168 hours.  Recent Labs  Lab 10/10/20 1614 10/10/20 1624  BNP 231.4*  --   SARSCOV2NAA  --  NEGATIVE    ------------------------------------------------------------------------------------------------------------------ No results for input(s): CHOL, HDL, LDLCALC, TRIG, CHOLHDL, LDLDIRECT in the last 72 hours.  Lab Results  Component Value Date   HGBA1C 6.1 (H) 02/11/2014   ------------------------------------------------------------------------------------------------------------------ Recent Labs    10/11/20 0026  TSH 5.740*    ------------------------------------------------------------------------------------------------------------------ No results for input(s): VITAMINB12, FOLATE, FERRITIN, TIBC, IRON, RETICCTPCT in the last 72 hours.  Coagulation profile Recent Labs  Lab 10/10/20 1845  INR 1.1   No results for input(s): DDIMER in the last 72 hours.  Cardiac Enzymes No results for input(s): CKTOTAL, CKMB, CKMBINDEX, TROPONINI in the last 168 hours.  ------------------------------------------------------------------------------------------------------------------    Component Value Date/Time   BNP 231.4 (H) 10/10/2020 1614     Antibiotics: Anti-infectives (From admission, onward)    Start     Dose/Rate Route Frequency Ordered Stop   10/12/20 2200  vancomycin (VANCOREADY) IVPB 1000 mg/200 mL  Status:  Discontinued        1,000 mg 200 mL/hr over 60 Minutes Intravenous Every 48 hours 10/10/20 2103 10/10/20 2229   10/11/20 0800  ceFEPIme (MAXIPIME) 2 g in sodium chloride 0.9 % 100 mL IVPB  Status:  Discontinued        2 g 200 mL/hr over 30 Minutes Intravenous Every 12 hours 10/10/20 2103 10/10/20 2229   10/11/20 0800  cefTRIAXone (ROCEPHIN) 2 g in sodium chloride 0.9 % 100 mL IVPB  Status:  Discontinued        2 g 200 mL/hr over 30 Minutes Intravenous Every 24 hours 10/10/20 2229 10/11/20 0908   10/10/20 2230  azithromycin (ZITHROMAX) 500 mg in sodium chloride 0.9 % 250 mL IVPB        500 mg 250 mL/hr over 60 Minutes Intravenous Every 24 hours 10/10/20 2229 10/15/20 2229   10/10/20 2015  vancomycin (VANCOREADY) IVPB 1250 mg/250 mL        1,250 mg 166.7 mL/hr over 90 Minutes Intravenous  Once 10/10/20 1944 10/10/20 2303   10/10/20 1945  ceFEPIme (MAXIPIME) 2 g in sodium chloride 0.9 % 100 mL IVPB        2 g 200 mL/hr over 30 Minutes Intravenous  Once 10/10/20 1944 10/10/20 2106        Radiology Reports  DG Chest Port 1 View  Result Date: 10/10/2020 CLINICAL DATA:  Sepsis. EXAM: PORTABLE  CHEST 1 VIEW COMPARISON:  02/21/2020 FINDINGS: The heart is enlarged but stable. Moderate tortuosity and calcification of the thoracic aorta.  Stable surgical changes from bypass surgery. No acute pulmonary findings. No pleural effusions or pneumothorax. The bony thorax is intact. IMPRESSION: Cardiac enlargement but no acute pulmonary findings. Electronically Signed   By: Rudie MeyerP.  Gallerani M.D.   On: 10/10/2020 18:55      DVT prophylaxis: Lovenox  Code Status: DNR  Family Communication: No family at bedside   Consultants:   Procedures:     Objective    Physical Examination:  General-appears in no acute distress Heart-S1-S2, regular, no murmur auscultated Lungs-bilateral wheezing auscultated Abdomen-soft, nontender, no organomegaly Extremities-trace  edema in the lower extremities Neuro-alert, oriented to self only  Status is: Inpatient  Dispo: The patient is from: Home              Anticipated d/c is to: Home              Anticipated d/c date is: 10/13/2020              Patient currently not stable for discharge  Barrier to discharge-treatment for acute bronchitis  COVID-19 Labs  No results for input(s): DDIMER, FERRITIN, LDH, CRP in the last 72 hours.  Lab Results  Component Value Date   SARSCOV2NAA NEGATIVE 10/10/2020   SARSCOV2NAA NEGATIVE 02/21/2020    Microbiology  Recent Results (from the past 240 hour(s))  Resp Panel by RT-PCR (Flu A&B, Covid) Nasopharyngeal Swab     Status: None   Collection Time: 10/10/20  4:24 PM   Specimen: Nasopharyngeal Swab; Nasopharyngeal(NP) swabs in vial transport medium  Result Value Ref Range Status   SARS Coronavirus 2 by RT PCR NEGATIVE NEGATIVE Final    Comment: (NOTE) SARS-CoV-2 target nucleic acids are NOT DETECTED.  The SARS-CoV-2 RNA is generally detectable in upper respiratory specimens during the acute phase of infection. The lowest concentration of SARS-CoV-2 viral copies this assay can detect is 138 copies/mL. A  negative result does not preclude SARS-Cov-2 infection and should not be used as the sole basis for treatment or other patient management decisions. A negative result may occur with  improper specimen collection/handling, submission of specimen other than nasopharyngeal swab, presence of viral mutation(s) within the areas targeted by this assay, and inadequate number of viral copies(<138 copies/mL). A negative result must be combined with clinical observations, patient history, and epidemiological information. The expected result is Negative.  Fact Sheet for Patients:  BloggerCourse.comhttps://www.fda.gov/media/152166/download  Fact Sheet for Healthcare Providers:  SeriousBroker.ithttps://www.fda.gov/media/152162/download  This test is no t yet approved or cleared by the [place] FDA and  has been authorized for detection and/or diagnosis of SARS-CoV-2 by FDA under an Emergency Use Authorization (EUA). This EUA will remain  in effect (meaning this test can be used) for the duration of the COVID-19 declaration under Section 564(b)(1) of the Act, 21 U.S.C.section 360bbb-3(b)(1), unless the authorization is terminated  or revoked sooner.       Influenza A by PCR NEGATIVE NEGATIVE Final   Influenza B by PCR NEGATIVE NEGATIVE Final    Comment: (NOTE) The Xpert Xpress SARS-CoV-2/FLU/RSV plus assay is intended as an aid in the diagnosis of influenza from Nasopharyngeal swab specimens and should not be used as a sole basis for treatment. Nasal washings and aspirates are unacceptable for Xpert Xpress SARS-CoV-2/FLU/RSV testing.  Fact Sheet for Patients: BloggerCourse.comhttps://www.fda.gov/media/152166/download  Fact Sheet for Healthcare Providers: SeriousBroker.ithttps://www.fda.gov/media/152162/download  This test is not yet approved or cleared by the [place] FDA and has been authorized for detection and/or diagnosis of SARS-CoV-2 by FDA under an Emergency Use Authorization (  EUA). This EUA will remain in effect (meaning this test can  be used) for the duration of the COVID-19 declaration under Section 564(b)(1) of the Act, 21 U.S.C. section 360bbb-3(b)(1), unless the authorization is terminated or revoked.  Performed at [facility], [address] [place], [place] [address]   Blood Culture (routine x 2)     Status: None (Preliminary result)   Collection Time: 10/10/20  4:44 PM   Specimen: BLOOD  Result Value Ref Range Status   Specimen Description BLOOD LEFT ANTECUBITAL  Final   Special Requests   Final    BOTTLES DRAWN AEROBIC AND ANAEROBIC Blood Culture results may not be optimal due to an inadequate volume of blood received in culture bottles   Culture   Final    NO GROWTH < 24 HOURS Performed at [facility], [address], [place], [place] [address]    Report Status PENDING  Incomplete  Blood Culture (routine x 2)     Status: None (Preliminary result)   Collection Time: 10/10/20  4:44 PM   Specimen: BLOOD RIGHT HAND  Result Value Ref Range Status   Specimen Description BLOOD RIGHT HAND  Final   Special Requests   Final    BOTTLES DRAWN AEROBIC AND ANAEROBIC Blood Culture adequate volume   Culture   Final    NO GROWTH < 24 HOURS Performed at [facility], [address] [place], [place] [address]    Report Status PENDING  Incomplete             Meredeth Ide   [facility] If 7PM-7AM, please contact night-coverage at [email or web address], Office  [phone]   10/11/2020, 2:03 PM  LOS: 0 days

## 2020-10-12 DIAGNOSIS — J40 Bronchitis, not specified as acute or chronic: Secondary | ICD-10-CM | POA: Diagnosis not present

## 2020-10-12 DIAGNOSIS — R509 Fever, unspecified: Secondary | ICD-10-CM

## 2020-10-12 LAB — BASIC METABOLIC PANEL
Anion gap: 12 (ref 5–15)
BUN: 30 mg/dL — ABNORMAL HIGH (ref 8–23)
CO2: 20 mmol/L — ABNORMAL LOW (ref 22–32)
Calcium: 9.5 mg/dL (ref 8.9–10.3)
Chloride: 102 mmol/L (ref 98–111)
Creatinine, Ser: 1.86 mg/dL — ABNORMAL HIGH (ref 0.44–1.00)
GFR, Estimated: 26 mL/min — ABNORMAL LOW (ref >60)
Glucose, Bld: 142 mg/dL — ABNORMAL HIGH (ref 70–99)
Potassium: 4.1 mmol/L (ref 3.5–5.1)
Sodium: 134 mmol/L — ABNORMAL LOW (ref 135–145)

## 2020-10-12 LAB — GLUCOSE, CAPILLARY
Glucose-Capillary: 148 mg/dL — ABNORMAL HIGH (ref 70–99)
Glucose-Capillary: 147 mg/dL — ABNORMAL HIGH (ref 70–99)
Glucose-Capillary: 152 mg/dL — ABNORMAL HIGH (ref 70–99)

## 2020-10-12 LAB — URINE CULTURE

## 2020-10-12 MED ORDER — SODIUM CHLORIDE 0.9 % IV SOLN: INTRAVENOUS | Status: DC

## 2020-10-12 MED ORDER — AZITHROMYCIN 250 MG PO TABS
500.0000 mg | ORAL_TABLET | Freq: Every day | ORAL | Status: DC
  Administered 2020-10-12: 500 mg via ORAL
  Filled 2020-10-12 (×2): qty 2

## 2020-10-12 MED ORDER — HALOPERIDOL 1 MG PO TABS
2.0000 mg | ORAL_TABLET | Freq: Four times a day (QID) | ORAL | Status: DC | PRN
  Administered 2020-10-12 – 2020-10-13 (×2): 2 mg via ORAL
  Filled 2020-10-12 (×4): qty 2

## 2020-10-12 MED ORDER — ENOXAPARIN SODIUM 30 MG/0.3ML IJ SOSY
30.0000 mg | PREFILLED_SYRINGE | Freq: Every day | INTRAMUSCULAR | Status: DC
  Administered 2020-10-12 – 2020-10-14 (×3): 30 mg via SUBCUTANEOUS
  Filled 2020-10-12 (×3): qty 0.3

## 2020-10-12 NOTE — Progress Notes (Addendum)
[facility]  PROGRESS NOTE  [name] [name] UEA:[id number] [name]:[id number] DOB: 1932/11/01 DOA: 10/10/2020 PCP: Sharon SellerEubanks, Jessica K, NP   Brief HPI:   87 year old female with history of advanced dementia, CAD s/p CABG, resolved ischemic cardiomyopathy after CABG, hypertension who has been experiencing shortness of breath for past 2 to 3 days.  In the ED she was found to be febrile with temperature 102 degrees Fahrenheit, diffuse wheezing, chest x-ray did not show infiltrates, lab work showed BNP of 231, lactic acid was normal.  Patient was started on empiric antibiotics for possible pneumonia and also received 1 dose of Lasix for possible CHF.    Subjective   Patient seen and examined, no new complaints.  Not requiring oxygen.   Assessment/Plan:     Acute bronchitis -Presented with wheezing -Continue Solu-Medrol 40 mg IV every 12 hour -DuoNeb nebulizers every 6 hours -Discontinued  ceftriaxone; continue Zithromax  Acute kidney injury -Creatinine elevated 1.86; secondary to diuresis -Patient is currently on Lasix, she only received 1 dose of Lasix 20 mg IV x1 2 days ago. -We will start her on gentle IV fluids with normal saline at 50 mill per hour -Follow BMP in am to check for improvement; if creatinine improving, will DC home   Hypothyroidism -Continue Synthroid   Hypertension -Blood pressure is stable -Continue amlodipine   History of CAD s/p CABG -Ischemic cardiomyopathy resolved after CABG -Continue Zetia   Hyponatremia -Sodium improved to 132    Scheduled medications:    amLODipine  10 mg Oral Daily   azithromycin  500 mg Oral Daily   enoxaparin (LOVENOX) injection  30 mg Subcutaneous QHS   ezetimibe  10 mg Oral Daily   ipratropium-albuterol  3 mL Nebulization Q6H   latanoprost  1 drop Left Eye QHS   levothyroxine  100 mcg Oral QAC breakfast   methylPREDNISolone (SOLU-MEDROL) injection  40 mg Intravenous Q12H         Data Reviewed:   CBG:  Recent Labs   Lab 10/11/20 1648 10/11/20 2045 10/12/20 0118 10/12/20 0537 10/12/20 1157  GLUCAP 127* 168* 148* 147* 152*    SpO2: 95 %    Vitals:   10/12/20 0713 10/12/20 0752 10/12/20 0814 10/12/20 1158  BP:  136/74 (!) 158/84 (!) 143/78  Pulse:   90 85  Resp:  18    Temp:  98.4 F (36.9 C) 98.6 F (37 C) 98.3 F (36.8 C)  TempSrc:  Oral Oral Axillary  SpO2: 92%  93% 95%  Weight:         Intake/Output Summary (Last 24 hours) at 10/12/2020 1321 Last data filed at 10/12/2020 1000 Gross per 24 hour  Intake 799.89 ml  Output --  Net 799.89 ml    06/10 1901 - 06/12 0700 In: 549.9  Out: 550 [Urine:550]  Filed Weights   10/10/20 2338 10/11/20 0308 10/12/20 0055  Weight: 71 kg 70.8 kg 71.3 kg    CBC:  Recent Labs  Lab 10/10/20 1845 10/11/20 0026  WBC 5.1 5.0  HGB 11.8* 13.2  HCT 36.0 39.3  PLT 198 192  MCV 86.3 84.9  MCH 28.3 28.5  MCHC 32.8 33.6  RDW 15.3 15.0  LYMPHSABS 0.8  --   MONOABS 0.3  --   EOSABS 0.0  --   BASOSABS 0.0  --     Complete metabolic panel:  Recent Labs  Lab 10/10/20 1614 10/10/20 1644 10/10/20 1845 10/10/20 1921 10/11/20 0026 10/12/20 0951  NA  --   --  127*  --  132* 134*  K  --   --  3.7  --  3.1* 4.1  CL  --   --  94*  --  98 102  CO2  --   --  22  --  23 20*  GLUCOSE  --   --  98  --  112* 142*  BUN  --   --  10  --  11 30*  CREATININE  --   --  0.85  --  0.98 1.86*  CALCIUM  --   --  9.2  --  9.2 9.5  AST  --   --  26  --   --   --   ALT  --   --  14  --   --   --   ALKPHOS  --   --  41  --   --   --   BILITOT  --   --  0.5  --   --   --   ALBUMIN  --   --  3.8  --   --   --   LATICACIDVEN  --  1.1  --  1.0  --   --   INR  --   --  1.1  --   --   --   TSH  --   --   --   --  5.740*  --   BNP 231.4*  --   --   --   --   --     No results for input(s): LIPASE, AMYLASE in the last 168 hours.  Recent Labs  Lab 10/10/20 1614 10/10/20 1624  BNP 231.4*  --   SARSCOV2NAA  --  NEGATIVE     ------------------------------------------------------------------------------------------------------------------ No results for input(s): CHOL, HDL, LDLCALC, TRIG, CHOLHDL, LDLDIRECT in the last 72 hours.  Lab Results  Component Value Date   HGBA1C 6.1 (H) 02/11/2014   ------------------------------------------------------------------------------------------------------------------ Recent Labs    10/11/20 0026  TSH 5.740*   ------------------------------------------------------------------------------------------------------------------ No results for input(s): VITAMINB12, FOLATE, FERRITIN, TIBC, IRON, RETICCTPCT in the last 72 hours.  Coagulation profile Recent Labs  Lab 10/10/20 1845  INR 1.1   No results for input(s): DDIMER in the last 72 hours.  Cardiac Enzymes No results for input(s): CKTOTAL, CKMB, CKMBINDEX, TROPONINI in the last 168 hours.  ------------------------------------------------------------------------------------------------------------------    Component Value Date/Time   BNP 231.4 (H) 10/10/2020 1614     Antibiotics: Anti-infectives (From admission, onward)    Start     Dose/Rate Route Frequency Ordered Stop   10/12/20 2200  vancomycin (VANCOREADY) IVPB 1000 mg/200 mL  Status:  Discontinued        1,000 mg 200 mL/hr over 60 Minutes Intravenous Every 48 hours 10/10/20 2103 10/10/20 2229   10/12/20 2200  azithromycin (ZITHROMAX) tablet 500 mg        500 mg Oral Daily 10/12/20 1214 10/15/20 2159   10/11/20 0800  ceFEPIme (MAXIPIME) 2 g in sodium chloride 0.9 % 100 mL IVPB  Status:  Discontinued        2 g 200 mL/hr over 30 Minutes Intravenous Every 12 hours 10/10/20 2103 10/10/20 2229   10/11/20 0800  cefTRIAXone (ROCEPHIN) 2 g in sodium chloride 0.9 % 100 mL IVPB  Status:  Discontinued        2 g 200 mL/hr over 30 Minutes Intravenous Every 24 hours 10/10/20 2229 10/11/20 0908   10/10/20 2230  azithromycin (ZITHROMAX) 500 mg in sodium chloride  0.9 % 250  mL IVPB  Status:  Discontinued        500 mg 250 mL/hr over 60 Minutes Intravenous Every 24 hours 10/10/20 2229 10/12/20 1214   10/10/20 2015  vancomycin (VANCOREADY) IVPB 1250 mg/250 mL        1,250 mg 166.7 mL/hr over 90 Minutes Intravenous  Once 10/10/20 1944 10/10/20 2303   10/10/20 1945  ceFEPIme (MAXIPIME) 2 g in sodium chloride 0.9 % 100 mL IVPB        2 g 200 mL/hr over 30 Minutes Intravenous  Once 10/10/20 1944 10/10/20 2106        Radiology Reports  DG Chest Port 1 View  Result Date: 10/10/2020 CLINICAL DATA:  Sepsis. EXAM: PORTABLE CHEST 1 VIEW COMPARISON:  02/21/2020 FINDINGS: The heart is enlarged but stable. Moderate tortuosity and calcification of the thoracic aorta. Stable surgical changes from bypass surgery. No acute pulmonary findings. No pleural effusions or pneumothorax. The bony thorax is intact. IMPRESSION: Cardiac enlargement but no acute pulmonary findings. Electronically Signed   By: Rudie Meyer M.D.   On: 10/10/2020 18:55      DVT prophylaxis: Lovenox  Code Status: DNR  Family Communication: Discussed with son at bedside   Consultants:   Procedures:     Objective    Physical Examination:  General-appears in no acute distress Heart-S1-S2, regular, no murmur auscultated Lungs-clear to auscultation bilaterally, no wheezing or crackles auscultated Abdomen-soft, nontender, no organomegaly Extremities-no edema in the lower extremities Neuro-alert, not oriented x3, no focal deficit noted  Status is: Inpatient  Dispo: The patient is from: Home              Anticipated d/c is to: Home              Anticipated d/c date is: 10/13/2020              Patient currently not stable for discharge  Barrier to discharge-treatment for acute bronchitis, acute kidney injury  COVID-19 Labs  No results for input(s): DDIMER, FERRITIN, LDH, CRP in the last 72 hours.  Lab Results  Component Value Date   SARSCOV2NAA NEGATIVE 10/10/2020    SARSCOV2NAA NEGATIVE 02/21/2020    Microbiology  Recent Results (from the past 240 hour(s))  Resp Panel by RT-PCR (Flu A&B, Covid) Nasopharyngeal Swab     Status: None   Collection Time: 10/10/20  4:24 PM   Specimen: Nasopharyngeal Swab; Nasopharyngeal(NP) swabs in vial transport medium  Result Value Ref Range Status   SARS Coronavirus 2 by RT PCR NEGATIVE NEGATIVE Final    Comment: (NOTE) SARS-CoV-2 target nucleic acids are NOT DETECTED.  The SARS-CoV-2 RNA is generally detectable in upper respiratory specimens during the acute phase of infection. The lowest concentration of SARS-CoV-2 viral copies this assay can detect is 138 copies/mL. A negative result does not preclude SARS-Cov-2 infection and should not be used as the sole basis for treatment or other patient management decisions. A negative result may occur with  improper specimen collection/handling, submission of specimen other than nasopharyngeal swab, presence of viral mutation(s) within the areas targeted by this assay, and inadequate number of viral copies(<138 copies/mL). A negative result must be combined with clinical observations, patient history, and epidemiological information. The expected result is Negative.  Fact Sheet for Patients:  BloggerCourse.com  Fact Sheet for Healthcare Providers:  SeriousBroker.it  This test is no t yet approved or cleared by the [place] FDA and  has been authorized for detection and/or diagnosis of SARS-CoV-2 by FDA under  an Emergency Use Authorization (EUA). This EUA will remain  in effect (meaning this test can be used) for the duration of the COVID-19 declaration under Section 564(b)(1) of the Act, 21 U.S.C.section 360bbb-3(b)(1), unless the authorization is terminated  or revoked sooner.       Influenza A by PCR NEGATIVE NEGATIVE Final   Influenza B by PCR NEGATIVE NEGATIVE Final    Comment: (NOTE) The Xpert Xpress  SARS-CoV-2/FLU/RSV plus assay is intended as an aid in the diagnosis of influenza from Nasopharyngeal swab specimens and should not be used as a sole basis for treatment. Nasal washings and aspirates are unacceptable for Xpert Xpress SARS-CoV-2/FLU/RSV testing.  Fact Sheet for Patients: BloggerCourse.com  Fact Sheet for Healthcare Providers: SeriousBroker.it  This test is not yet approved or cleared by the [place] FDA and has been authorized for detection and/or diagnosis of SARS-CoV-2 by FDA under an Emergency Use Authorization (EUA). This EUA will remain in effect (meaning this test can be used) for the duration of the COVID-19 declaration under Section 564(b)(1) of the Act, 21 U.S.C. section 360bbb-3(b)(1), unless the authorization is terminated or revoked.  Performed at [facility], [address], [place], [place] [address]   Blood Culture (routine x 2)     Status: None (Preliminary result)   Collection Time: 10/10/20  4:44 PM   Specimen: BLOOD  Result Value Ref Range Status   Specimen Description BLOOD LEFT ANTECUBITAL  Final   Special Requests   Final    BOTTLES DRAWN AEROBIC AND ANAEROBIC Blood Culture results may not be optimal due to an inadequate volume of blood received in culture bottles   Culture   Final    NO GROWTH 2 DAYS Performed at [facility], [address] [place], [place] [address]    Report Status PENDING  Incomplete  Blood Culture (routine x 2)     Status: None (Preliminary result)   Collection Time: 10/10/20  4:44 PM   Specimen: BLOOD RIGHT HAND  Result Value Ref Range Status   Specimen Description BLOOD RIGHT HAND  Final   Special Requests   Final    BOTTLES DRAWN AEROBIC AND ANAEROBIC Blood Culture adequate volume   Culture   Final    NO GROWTH 2 DAYS Performed at [facility], [address] [place], [place] [address]    Report Status PENDING  Incomplete              Meredeth Ide   [facility] If 7PM-7AM, please contact night-coverage at [email or web address], Office  [phone]   10/12/2020, 1:21 PM  LOS: 0 days

## 2020-10-12 NOTE — Care Management Obs Status (Signed)
MEDICARE OBSERVATION STATUS NOTIFICATION   Patient Details  Name: [name] [name] MRN: [id number] Date of Birth: Oct 27, 1932   Medicare Observation Status Notification Given:  No (Could not reach family)    Lawerance Sabal, RN 10/12/2020, 8:54 AM

## 2020-10-13 ENCOUNTER — Observation Stay (HOSPITAL_COMMUNITY): Payer: Medicare PPO

## 2020-10-13 ENCOUNTER — Inpatient Hospital Stay (HOSPITAL_COMMUNITY): Payer: Medicare PPO

## 2020-10-13 DIAGNOSIS — Z20822 Contact with and (suspected) exposure to covid-19: Secondary | ICD-10-CM | POA: Diagnosis present

## 2020-10-13 DIAGNOSIS — E785 Hyperlipidemia, unspecified: Secondary | ICD-10-CM | POA: Diagnosis present

## 2020-10-13 DIAGNOSIS — E039 Hypothyroidism, unspecified: Secondary | ICD-10-CM | POA: Diagnosis present

## 2020-10-13 DIAGNOSIS — N179 Acute kidney failure, unspecified: Secondary | ICD-10-CM | POA: Diagnosis present

## 2020-10-13 DIAGNOSIS — Z6830 Body mass index (BMI) 30.0-30.9, adult: Secondary | ICD-10-CM | POA: Diagnosis not present

## 2020-10-13 DIAGNOSIS — N132 Hydronephrosis with renal and ureteral calculous obstruction: Secondary | ICD-10-CM | POA: Diagnosis present

## 2020-10-13 DIAGNOSIS — I6529 Occlusion and stenosis of unspecified carotid artery: Secondary | ICD-10-CM | POA: Diagnosis present

## 2020-10-13 DIAGNOSIS — I5023 Acute on chronic systolic (congestive) heart failure: Secondary | ICD-10-CM | POA: Diagnosis present

## 2020-10-13 DIAGNOSIS — E559 Vitamin D deficiency, unspecified: Secondary | ICD-10-CM | POA: Diagnosis present

## 2020-10-13 DIAGNOSIS — R330 Drug induced retention of urine: Secondary | ICD-10-CM | POA: Diagnosis not present

## 2020-10-13 DIAGNOSIS — T380X5A Adverse effect of glucocorticoids and synthetic analogues, initial encounter: Secondary | ICD-10-CM | POA: Diagnosis not present

## 2020-10-13 DIAGNOSIS — I110 Hypertensive heart disease with heart failure: Secondary | ICD-10-CM | POA: Diagnosis present

## 2020-10-13 DIAGNOSIS — F0391 Unspecified dementia with behavioral disturbance: Secondary | ICD-10-CM | POA: Diagnosis present

## 2020-10-13 DIAGNOSIS — J40 Bronchitis, not specified as acute or chronic: Secondary | ICD-10-CM | POA: Diagnosis not present

## 2020-10-13 DIAGNOSIS — Z66 Do not resuscitate: Secondary | ICD-10-CM | POA: Diagnosis present

## 2020-10-13 DIAGNOSIS — I2510 Atherosclerotic heart disease of native coronary artery without angina pectoris: Secondary | ICD-10-CM | POA: Diagnosis present

## 2020-10-13 DIAGNOSIS — E669 Obesity, unspecified: Secondary | ICD-10-CM | POA: Diagnosis present

## 2020-10-13 DIAGNOSIS — Z951 Presence of aortocoronary bypass graft: Secondary | ICD-10-CM | POA: Diagnosis not present

## 2020-10-13 DIAGNOSIS — T502X5A Adverse effect of carbonic-anhydrase inhibitors, benzothiadiazides and other diuretics, initial encounter: Secondary | ICD-10-CM | POA: Diagnosis present

## 2020-10-13 DIAGNOSIS — E871 Hypo-osmolality and hyponatremia: Secondary | ICD-10-CM | POA: Diagnosis present

## 2020-10-13 DIAGNOSIS — E876 Hypokalemia: Secondary | ICD-10-CM | POA: Diagnosis not present

## 2020-10-13 DIAGNOSIS — J209 Acute bronchitis, unspecified: Secondary | ICD-10-CM | POA: Diagnosis present

## 2020-10-13 DIAGNOSIS — I255 Ischemic cardiomyopathy: Secondary | ICD-10-CM | POA: Diagnosis present

## 2020-10-13 DIAGNOSIS — G9340 Encephalopathy, unspecified: Secondary | ICD-10-CM | POA: Diagnosis present

## 2020-10-13 DIAGNOSIS — G4700 Insomnia, unspecified: Secondary | ICD-10-CM | POA: Diagnosis present

## 2020-10-13 DIAGNOSIS — F411 Generalized anxiety disorder: Secondary | ICD-10-CM | POA: Diagnosis present

## 2020-10-13 LAB — GLUCOSE, CAPILLARY
Glucose-Capillary: 148 mg/dL — ABNORMAL HIGH (ref 70–99)
Glucose-Capillary: 126 mg/dL — ABNORMAL HIGH (ref 70–99)
Glucose-Capillary: 132 mg/dL — ABNORMAL HIGH (ref 70–99)

## 2020-10-13 LAB — CBC
HCT: 37.3 % (ref 36.0–46.0)
Hemoglobin: 12.4 g/dL (ref 12.0–15.0)
MCH: 28.5 pg (ref 26.0–34.0)
MCHC: 33.2 g/dL (ref 30.0–36.0)
MCV: 85.7 fL (ref 80.0–100.0)
Platelets: 233 10*3/uL (ref 150–400)
RBC: 4.35 MIL/uL (ref 3.87–5.11)
RDW: 15.6 % — ABNORMAL HIGH (ref 11.5–15.5)
WBC: 12.1 10*3/uL — ABNORMAL HIGH (ref 4.0–10.5)
nRBC: 0 % (ref 0.0–0.2)

## 2020-10-13 LAB — COMPREHENSIVE METABOLIC PANEL
ALT: 15 U/L (ref 0–44)
AST: 28 U/L (ref 15–41)
Albumin: 3.4 g/dL — ABNORMAL LOW (ref 3.5–5.0)
Alkaline Phosphatase: 38 U/L (ref 38–126)
Anion gap: 10 (ref 5–15)
BUN: 42 mg/dL — ABNORMAL HIGH (ref 8–23)
CO2: 21 mmol/L — ABNORMAL LOW (ref 22–32)
Calcium: 9.4 mg/dL (ref 8.9–10.3)
Chloride: 102 mmol/L (ref 98–111)
Creatinine, Ser: 2.64 mg/dL — ABNORMAL HIGH (ref 0.44–1.00)
GFR, Estimated: 17 mL/min — ABNORMAL LOW (ref >60)
Glucose, Bld: 137 mg/dL — ABNORMAL HIGH (ref 70–99)
Potassium: 4.1 mmol/L (ref 3.5–5.1)
Sodium: 133 mmol/L — ABNORMAL LOW (ref 135–145)
Total Bilirubin: 0.4 mg/dL (ref 0.3–1.2)
Total Protein: 7 g/dL (ref 6.5–8.1)

## 2020-10-13 LAB — LEGIONELLA PNEUMOPHILA SEROGP 1 UR AG: L. pneumophila Serogp 1 Ur Ag: NEGATIVE

## 2020-10-13 MED ORDER — LORAZEPAM 2 MG/ML IJ SOLN
1.0000 mg | Freq: Once | INTRAMUSCULAR | Status: AC | PRN
  Administered 2020-10-13: 1 mg via INTRAVENOUS
  Filled 2020-10-13: qty 1

## 2020-10-13 MED ORDER — SODIUM CHLORIDE 0.9 % IV SOLN
1.0000 g | INTRAVENOUS | Status: DC
  Administered 2020-10-13 – 2020-10-15 (×3): 1 g via INTRAVENOUS
  Filled 2020-10-13 (×4): qty 10

## 2020-10-13 MED ORDER — IPRATROPIUM-ALBUTEROL 0.5-2.5 (3) MG/3ML IN SOLN
3.0000 mL | Freq: Three times a day (TID) | RESPIRATORY_TRACT | Status: DC
  Administered 2020-10-13 – 2020-10-14 (×4): 3 mL via RESPIRATORY_TRACT
  Filled 2020-10-13 (×4): qty 3

## 2020-10-13 MED ORDER — CHLORHEXIDINE GLUCONATE CLOTH 2 % EX PADS
6.0000 | MEDICATED_PAD | Freq: Every day | CUTANEOUS | Status: DC
  Administered 2020-10-14 – 2020-10-17 (×3): 6 via TOPICAL

## 2020-10-13 NOTE — Progress Notes (Signed)
2000 Pt resting comfortably in bed. Son as bedside. Son updated RN about mother's condition. NAD, WCTM.   2030 RN in to assess pt. Pt A&Ox1, to self only. Pt reoriented and assessed. Mittens bilaterally as pt has already pulled at tele leads and IV. Fall precautions in place, [facility].   2215 Pt medicated per MAR. Meds crushed in applesauce. Pt needed encouraging to swallow and could not sip water with straw. Speech consult placed earlier today for swallow eval. Pt very active in bed. Fall precautions in place, [facility].   2300 RN bathed pt, gave tylenol as pt is stating that her abdomen hurts. When RN assesses does not grimace in pain to palpitation. Per son, pt will sometimes state her abdomen hurts but then will either forget or deny when asked about it. Pt fell right to sleep as RN was cleaning up room after bathing pt. Bed alarm on, WCTM.   0000 Bed alarm going off, RN to room. Pt with legs over side rails. Working hard to get mittens bilaterally off. Pt confused, hallucinating (talking to people not in the room, talking about the cat). Pt re-oriented, repositioned, checked to make sure pt was dry and clean. Pt re-assured her family would be back in the morning. Nothing seemed to help. RN sitting in patient's room for fall prevention. Awaiting dose of haldol from pharmacy. WCTM.   0055 Haldol 2mg  given, crushed in applesauce as patient has had no change in behavior from previous note. Pt still trying to get OOB, throwing legs over side rails, saying she needs to get home. Attempted to re-orient pt but nothing seems to work. Room dark, soft piano music playing and room temp to cool. RN sitting at bedside with pt for monitoring. WCTM.   0215 Pt still very agitated, still throwing legs over side rails, sitting up in bed. Pt asked RN for xanax, told RN to get one for her. MD paged, awaiting response. Pt still being re-oriented, pt asking RN why she isn't cleaning her house, and putting dishes away. RN  attempting to keep pt in bed. WCTM.

## 2020-10-13 NOTE — Evaluation (Signed)
Clinical/Bedside Swallow Evaluation Patient Details  Name: [name] [name] MRN: [id number] Date of Birth: 28-Aug-1932  Today's Date: 10/13/2020 Time: SLP Start Time (ACUTE ONLY): 1046 SLP Stop Time (ACUTE ONLY): 1108 SLP Time Calculation (min) (ACUTE ONLY): 22 min  Past Medical History:  Past Medical History:  Diagnosis Date   Carotid stenosis    a. Carotid US (10/15):  Bilateral ICA 1-39%   Coronary artery disease    a. NSTEMI >> LHC (02/11/14):  pLAD 95%, mLAD occl, CFX < 20%, pRCA 30%, EF 35%, ant and apical AK >> CABG   Dementia (HCC)    Glucose intolerance (impaired glucose tolerance)    a. A1c 6.1 (01/2014)   H/O non-ST elevation myocardial infarction (NSTEMI)    01/2014   HLD (hyperlipidemia)    Hypertension    Ischemic cardiomyopathy    a. EF 35% by cath at time of NSTEMI >> b. Echo (10/15):  EF 50% to 55%. Wall motion was normal; Grade 1 diastolic dysfunction.  Mild AI.  Ascending aorta mildly dilated. Mild MR.  Mild LAE.  PA peak pressure: 46 mm Hg (S).   Past Surgical History:  Past Surgical History:  Procedure Laterality Date   CESAREAN SECTION     CORONARY ARTERY BYPASS GRAFT N/A 02/15/2014   Procedure: CORONARY ARTERY BYPASS GRAFTING (CABG) x three,  using left internal mammary artery and right leg greater saphenous vein harvested endoscopically;  Surgeon: Alleen Borne, MD;  Location: [facility] OR;  Service: Open Heart Surgery;  Laterality: N/A;   HIP ARTHROPLASTY Right 02/21/2020   Procedure: HEMIARTHROPLASTY ANTERIOR APPROACH;  Surgeon: Samson Frederic, MD;  Location: [facility] ORS;  Service: Orthopedics;  Laterality: Right;   LEFT HEART CATHETERIZATION WITH CORONARY ANGIOGRAM N/A 02/11/2014   Procedure: LEFT HEART CATHETERIZATION WITH CORONARY ANGIOGRAM;  Surgeon: Peter M Swaziland, MD;  Location: [facility] CATH LAB;  Service: Cardiovascular;  Laterality: N/A;   TEE WITHOUT CARDIOVERSION N/A 02/15/2014   Procedure: TRANSESOPHAGEAL ECHOCARDIOGRAM (TEE);  Surgeon: Alleen Borne, MD;   Location: [facility] OR;  Service: Open Heart Surgery;  Laterality: N/A;   HPI:  87 y.o. female presented to ED with shortness of breath and wheezing for the last 2 to 3 days prior to admission. In the ED she was found to be febrile with temperature 102 degrees Fahrenheit, diffuse wheezing, chest x-ray (10/10/20)  did not show infiltrates, lab work showed BNP of 231, lactic acid was normal.  Pt was started on empiric antibiotics for possible pneumonia and also received 1 dose of Lasix for possible CHF. RN note (10/13/20) reported "Pt needed encouraging to swallow and could not sip water with straw". PMH: advanced dementia, CAD status post CABG, resolved ischemic cardiomyopathy after CABG, hypertension.   Assessment / Plan / Recommendation Clinical Impression  Pt seen for bedside swallow evaluation with family member present to provide PLOF in regards to swallowing due to pt's hx of dementia. Family reports having to cut up pt's food at home as she does not masticate her food well. She also reports cueing pt to swallow on occasion, especially with meds. Pt very lethargic this am and awakened briefly for POs after max verbal and tactile stimulation cues with wash cloth to face. She consumed thin liquids via cup x2 without overt s/sx of aspiration, but was unable to suck through straw and suspect impact of baseline cognitive deficits and lethargy. Family stated that she drank liquids via straw this am without difficulty or signs of aspiration. x2 spoonfuls of puree unremarkable and pt  refused regular textured solid. Pt placed on soft diet on 10/12/20 and family/NT report no difficulty with meals, except for pt requiring consistent cueing to masticate due to fleeting attention. No oral holding, coughing, or choking reported. Swallow function suspected to be primarily impacted by baseline dementia. Recommend pt continue on current diet (soft/ thin liquids) with full supervision and assist for meals. No SLP f/u warranted.  Reconsult if changes occur. Above discussed with family and nursing who expressed agreement and understanding.  SLP Visit Diagnosis: Dysphagia, unspecified (R13.10)    Aspiration Risk  Mild aspiration risk    Diet Recommendation Thin liquid;Regular   Liquid Administration via: Cup;Straw Medication Administration: Crushed with puree Supervision: Full supervision/cueing for compensatory strategies Compensations: Minimize environmental distractions;Slow rate;Small sips/bites;Follow solids with liquid Postural Changes: Seated upright at 90 degrees    Other  Recommendations Oral Care Recommendations: Oral care BID;Staff/trained caregiver to provide oral care   Follow up Recommendations        Frequency and Duration            Prognosis        Swallow Study   General Date of Onset: 10/13/20 HPI: 87 y.o. female presented to ED with shortness of breath and wheezing for the last 2 to 3 days prior to admission. In the ED she was found to be febrile with temperature 102 degrees Fahrenheit, diffuse wheezing, chest x-ray (10/10/20)  did not show infiltrates, lab work showed BNP of 231, lactic acid was normal.  Pt was started on empiric antibiotics for possible pneumonia and also received 1 dose of Lasix for possible CHF. RN note (10/13/20) reported "Pt needed encouraging to swallow and could not sip water with straw". PMH: advanced dementia, CAD status post CABG, resolved ischemic cardiomyopathy after CABG, hypertension. Type of Study: Bedside Swallow Evaluation Previous Swallow Assessment: none Diet Prior to this Study: Regular;Thin liquids Temperature Spikes Noted: No Respiratory Status: Room air History of Recent Intubation: No Behavior/Cognition: Lethargic/Drowsy;Confused;Agitated Oral Cavity Assessment:  (difficult to assess) Oral Care Completed by SLP: No Oral Cavity - Dentition: Dentures, top;Dentures, bottom Vision:  (difficult to assess) Self-Feeding Abilities: Total  assist Patient Positioning: Upright in bed;Postural control interferes with function Baseline Vocal Quality: Normal    Oral/Motor/Sensory Function Overall Oral Motor/Sensory Function: Within functional limits   Ice Chips Ice chips: Not tested   Thin Liquid Thin Liquid: Within functional limits Presentation: Cup;Straw    Nectar Thick Nectar Thick Liquid: Not tested   Honey Thick Honey Thick Liquid: Not tested   Puree Puree: Within functional limits Presentation: Spoon   Solid     Solid: Not tested (refused solid)     Avie Echevaria, MA, CCC-SLP Acute Rehabilitation Services Office Number: [phone]  Paulette Blanch 10/13/2020,12:13 PM

## 2020-10-13 NOTE — Consult Note (Signed)
[facility]  HISTORY AND PHYSICAL  [name] [name] is an 87 y.o. female.    Chief Complaint: wheezing  HPI: Pt is an 49 F with demential, ischemic CM, HLD, h/o CABG who is now seen in consultation at the request of Dr Sharl Ma for eval and recs re: AKI.  Pt was seen at [facility] care 10/10/20 and was noted to have bronchitis/ wheezing.  Was sent to ED for eval and was admitted.  History provided by son.    Has had worsening dementia for the last 2-3 years.  Some concerns for aspration this admission.  SLP following.    Meds this admission included azithro/ ceftriaxone, one dose of vanc.  No hypotension and no other nephrotoxins.  Cr 0.98--> 1,86--> 2.64, prompting evaluation.    PMH: Past Medical History:  Diagnosis Date   Carotid stenosis    a. Carotid US (10/15):  Bilateral ICA 1-39%   Coronary artery disease    a. NSTEMI >> LHC (02/11/14):  pLAD 95%, mLAD occl, CFX < 20%, pRCA 30%, EF 35%, ant and apical AK >> CABG   Dementia (HCC)    Glucose intolerance (impaired glucose tolerance)    a. A1c 6.1 (01/2014)   H/O non-ST elevation myocardial infarction (NSTEMI)    01/2014   HLD (hyperlipidemia)    Hypertension    Ischemic cardiomyopathy    a. EF 35% by cath at time of NSTEMI >> b. Echo (10/15):  EF 50% to 55%. Wall motion was normal; Grade 1 diastolic dysfunction.  Mild AI.  Ascending aorta mildly dilated. Mild MR.  Mild LAE.  PA peak pressure: 46 mm Hg (S).   PSH: Past Surgical History:  Procedure Laterality Date   CESAREAN SECTION     CORONARY ARTERY BYPASS GRAFT N/A 02/15/2014   Procedure: CORONARY ARTERY BYPASS GRAFTING (CABG) x three,  using left internal mammary artery and right leg greater saphenous vein harvested endoscopically;  Surgeon: Alleen Borne, MD;  Location: [facility] OR;  Service: Open Heart Surgery;  Laterality: N/A;   HIP ARTHROPLASTY Right 02/21/2020   Procedure: HEMIARTHROPLASTY ANTERIOR APPROACH;  Surgeon: Samson Frederic, MD;  Location: [facility] ORS;   Service: Orthopedics;  Laterality: Right;   LEFT HEART CATHETERIZATION WITH CORONARY ANGIOGRAM N/A 02/11/2014   Procedure: LEFT HEART CATHETERIZATION WITH CORONARY ANGIOGRAM;  Surgeon: Peter M Swaziland, MD;  Location: [facility] CATH LAB;  Service: Cardiovascular;  Laterality: N/A;   TEE WITHOUT CARDIOVERSION N/A 02/15/2014   Procedure: TRANSESOPHAGEAL ECHOCARDIOGRAM (TEE);  Surgeon: Alleen Borne, MD;  Location: [facility] OR;  Service: Open Heart Surgery;  Laterality: N/A;    Past Medical History:  Diagnosis Date   Carotid stenosis    a. Carotid US (10/15):  Bilateral ICA 1-39%   Coronary artery disease    a. NSTEMI >> LHC (02/11/14):  pLAD 95%, mLAD occl, CFX < 20%, pRCA 30%, EF 35%, ant and apical AK >> CABG   Dementia (HCC)    Glucose intolerance (impaired glucose tolerance)    a. A1c 6.1 (01/2014)   H/O non-ST elevation myocardial infarction (NSTEMI)    01/2014   HLD (hyperlipidemia)    Hypertension    Ischemic cardiomyopathy    a. EF 35% by cath at time of NSTEMI >> b. Echo (10/15):  EF 50% to 55%. Wall motion was normal; Grade 1 diastolic dysfunction.  Mild AI.  Ascending aorta mildly dilated. Mild MR.  Mild LAE.  PA peak pressure: 46 mm Hg (S).    Medications:  Scheduled:  amLODipine  10 mg Oral Daily   azithromycin  500 mg Oral Daily   Chlorhexidine Gluconate Cloth  6 each Topical Daily   enoxaparin (LOVENOX) injection  30 mg Subcutaneous QHS   ezetimibe  10 mg Oral Daily   ipratropium-albuterol  3 mL Nebulization TID   latanoprost  1 drop Left Eye QHS   levothyroxine  100 mcg Oral QAC breakfast    Medications Prior to Admission  Medication Sig Dispense Refill   acetaminophen (TYLENOL) 500 MG tablet Take 1,000 mg by mouth every 6 (six) hours as needed (pain).     ALPRAZolam (XANAX) 0.25 MG tablet TAKE 1 TABLET BY MOUTH TWICE A DAY AS NEEDED FOR ANXIETY (Patient taking differently: Take 0.25 mg by mouth 2 (two) times daily as needed for anxiety.) 60 tablet 0   amLODipine (NORVASC) 10  MG tablet Take 10 mg by mouth daily.     busPIRone (BUSPAR) 30 MG tablet Take 15 mg by mouth 4 (four) times daily.      Calcium-Magnesium-Vitamin D 600-40-500 MG-MG-UNIT TB24 Take 1 tablet by mouth daily.     Cholecalciferol (VITAMIN D3) 50 MCG (2000 UT) TABS Take 1 capsule by mouth daily.     doxycycline (VIBRA-TABS) 100 MG tablet Take 1 tablet (100 mg total) by mouth 2 (two) times daily. 14 tablet 0   ezetimibe (ZETIA) 10 MG tablet Take 10 mg by mouth daily.     latanoprost (XALATAN) 0.005 % ophthalmic solution Place 1 drop into the left eye at bedtime.      levothyroxine (SYNTHROID) 100 MCG tablet TAKE 1 TABLET BY MOUTH DAILY BEFORE BREAKFAST. (Patient taking differently: Take 100 mcg by mouth daily before breakfast.) 90 tablet 1   mirtazapine (REMERON) 15 MG tablet TAKE 1 TABLET BY MOUTH EVERYDAY AT BEDTIME (Patient taking differently: Take 15 mg by mouth at bedtime.) 90 tablet 1   polyethylene glycol (MIRALAX / GLYCOLAX) 17 g packet Take 17 g by mouth daily. 14 each 0    ALLERGIES:   Allergies  Allergen Reactions   Namenda [Memantine]    Trazodone And Nefazodone     FAM HX: Family History  Problem Relation Age of Onset   Stroke Mother    Hypertension Mother    Hypertension Sister    Diabetes Brother    Diabetes Sister    Heart attack Neg Hx    Breast cancer Neg Hx     Social History:   reports that she has never smoked. She has never used smokeless tobacco. She reports that she does not drink alcohol and does not use drugs.  ROS: ROS: unobtainable d/t mental status  Blood pressure (!) 148/84, pulse 95, temperature 97.9 F (36.6 C), resp. rate 18, height 4\' 11"  (1.499 m), weight 69.8 kg, SpO2 91 %. PHYSICAL EXAM: Physical Exam GEN NAD, sitting in bed, sleeping HEENT eyes closed NECK no JVD PULM wheezy with some upper airway sounds CV RRR ABD thin EXT no edema NEURO sleeping   Results for orders placed or performed during the hospital encounter of 10/10/20 (from  the past 48 hour(s))  Glucose, capillary     Status: Abnormal   Collection Time: 10/11/20  4:48 PM  Result Value Ref Range   Glucose-Capillary 127 (H) 70 - 99 mg/dL    Comment: Glucose reference range applies only to samples taken after fasting for at least 8 hours.  Glucose, capillary     Status: Abnormal   Collection Time: 10/11/20  8:45 PM  Result Value  Ref Range   Glucose-Capillary 168 (H) 70 - 99 mg/dL    Comment: Glucose reference range applies only to samples taken after fasting for at least 8 hours.  Glucose, capillary     Status: Abnormal   Collection Time: 10/12/20  1:18 AM  Result Value Ref Range   Glucose-Capillary 148 (H) 70 - 99 mg/dL    Comment: Glucose reference range applies only to samples taken after fasting for at least 8 hours.  Glucose, capillary     Status: Abnormal   Collection Time: 10/12/20  5:37 AM  Result Value Ref Range   Glucose-Capillary 147 (H) 70 - 99 mg/dL    Comment: Glucose reference range applies only to samples taken after fasting for at least 8 hours.  Basic metabolic panel     Status: Abnormal   Collection Time: 10/12/20  9:51 AM  Result Value Ref Range   Sodium 134 (L) 135 - 145 mmol/L   Potassium 4.1 3.5 - 5.1 mmol/L   Chloride 102 98 - 111 mmol/L   CO2 20 (L) 22 - 32 mmol/L   Glucose, Bld 142 (H) 70 - 99 mg/dL    Comment: Glucose reference range applies only to samples taken after fasting for at least 8 hours.   BUN 30 (H) 8 - 23 mg/dL   Creatinine, Ser 7.62 (H) 0.44 - 1.00 mg/dL   Calcium 9.5 8.9 - 83.1 mg/dL   GFR, Estimated 26 (L) >60 mL/min    Comment: (NOTE) Calculated using the CKD-EPI Creatinine Equation (2021)    Anion gap 12 5 - 15    Comment: Performed at [facility], [address] [place], [place] [address]  Glucose, capillary     Status: Abnormal   Collection Time: 10/12/20 11:57 AM  Result Value Ref Range   Glucose-Capillary 152 (H) 70 - 99 mg/dL    Comment: Glucose reference range applies only to samples  taken after fasting for at least 8 hours.  Comprehensive metabolic panel     Status: Abnormal   Collection Time: 10/13/20  1:41 AM  Result Value Ref Range   Sodium 133 (L) 135 - 145 mmol/L   Potassium 4.1 3.5 - 5.1 mmol/L   Chloride 102 98 - 111 mmol/L   CO2 21 (L) 22 - 32 mmol/L   Glucose, Bld 137 (H) 70 - 99 mg/dL    Comment: Glucose reference range applies only to samples taken after fasting for at least 8 hours.   BUN 42 (H) 8 - 23 mg/dL   Creatinine, Ser 6.07 (H) 0.44 - 1.00 mg/dL   Calcium 9.4 8.9 - 37.1 mg/dL   Total Protein 7.0 6.5 - 8.1 g/dL   Albumin 3.4 (L) 3.5 - 5.0 g/dL   AST 28 15 - 41 U/L   ALT 15 0 - 44 U/L   Alkaline Phosphatase 38 38 - 126 U/L   Total Bilirubin 0.4 0.3 - 1.2 mg/dL   GFR, Estimated 17 (L) >60 mL/min    Comment: (NOTE) Calculated using the CKD-EPI Creatinine Equation (2021)    Anion gap 10 5 - 15    Comment: Performed at [facility], [address] [place], [place] [address]  Glucose, capillary     Status: Abnormal   Collection Time: 10/13/20  2:12 AM  Result Value Ref Range   Glucose-Capillary 148 (H) 70 - 99 mg/dL    Comment: Glucose reference range applies only to samples taken after fasting for at least 8 hours.  CBC     Status: Abnormal   Collection Time: 10/13/20  2:33 AM  Result Value Ref Range   WBC 12.1 (H) 4.0 - 10.5 K/uL   RBC 4.35 3.87 - 5.11 MIL/uL   Hemoglobin 12.4 12.0 - 15.0 g/dL   HCT 11.9 41.7 - 40.8 %   MCV 85.7 80.0 - 100.0 fL   MCH 28.5 26.0 - 34.0 pg   MCHC 33.2 30.0 - 36.0 g/dL   RDW 14.4 (H) 81.8 - 56.3 %   Platelets 233 150 - 400 K/uL   nRBC 0.0 0.0 - 0.2 %    Comment: Performed at [facility], [address], [place], [place] [address]  Glucose, capillary     Status: Abnormal   Collection Time: 10/13/20  6:04 AM  Result Value Ref Range   Glucose-Capillary 126 (H) 70 - 99 mg/dL    Comment: Glucose reference range applies only to samples taken after fasting for at least 8 hours.   Comment 1 Notify  RN    Comment 2 Document in Chart   Glucose, capillary     Status: Abnormal   Collection Time: 10/13/20 11:44 AM  Result Value Ref Range   Glucose-Capillary 132 (H) 70 - 99 mg/dL    Comment: Glucose reference range applies only to samples taken after fasting for at least 8 hours.    No results found.  Assessment/Plan   AKI: pattern of rapid rise in the absence of hypotension/ lots of nephrotoxins suggests obstruction.  Bladder scan ordered- 1800 mL in, Foley placed.  Expect this to resolve.   HTN: on home meds CAP: azithro/ ceftriazone Dementia Dispo: pending.  Bufford Buttner 10/13/2020, 2:57 PM

## 2020-10-13 NOTE — Progress Notes (Signed)
[facility]  PROGRESS NOTE  [name] [name] TKZ:[id number] DOB: December 17, 1932 DOA: 10/10/2020 PCP: [name], NP   Brief HPI:   87 year old female with history of advanced dementia, CAD s/p CABG, resolved ischemic cardiomyopathy after CABG, hypertension who has been experiencing shortness of breath for past 2 to 3 days.  In the ED she was found to be febrile with temperature 102 degrees Fahrenheit, diffuse wheezing, chest x-ray did not show infiltrates, lab work showed BNP of 231, lactic acid was normal.  Patient was started on empiric antibiotics for possible pneumonia and also received 1 dose of Lasix for possible CHF.    Subjective   Patient seen and examined, has been agitated since yesterday.  She was given Solu-Medrol for bronchitis which have been discontinued.  She was started on as needed Haldol.  Continues to be confused.   Assessment/Plan:     Acute bronchitis -Presented with wheezing -She was started on Solu-Medrol 40 mg IV every 12 hours, which has been discontinued since yesterday due to worsening confusion.   -DuoNeb nebulizers every 6 hours -Discontinued  ceftriaxone; continue Zithromax  Dementia with behavioral disturbance -Started on Haldol 2 mg p.o./IM every 6 hours as needed -Likely exacerbated probably due to Solu-Medrol as above.  Solu-Medrol has been discontinued. -We will order one-to-one sitter at bedside  Acute kidney injury -Creatinine elevated was elevated to 1.86 yesterday she was started on gentle IV hydration with normal saline at 50 mill per hour.  Today creatinine is up to 2.64.  Will consult nephrology for further recommendations.   -Patient only received 1 dose of Lasix 20 mg IV x1, 2 days ago. -We will start her on gentle IV fluids with normal saline at 50 mill per hour -Follow BMP in am    Hypothyroidism -Continue Synthroid   Hypertension -Blood pressure is stable -Continue amlodipine   History of CAD s/p CABG -Ischemic  cardiomyopathy resolved after CABG -Continue Zetia   Hyponatremia -Improved.    Scheduled medications:    amLODipine  10 mg Oral Daily   azithromycin  500 mg Oral Daily   enoxaparin (LOVENOX) injection  30 mg Subcutaneous QHS   ezetimibe  10 mg Oral Daily   ipratropium-albuterol  3 mL Nebulization TID   latanoprost  1 drop Left Eye QHS   levothyroxine  100 mcg Oral QAC breakfast         Data Reviewed:   CBG:  Recent Labs  Lab 10/12/20 0118 10/12/20 0537 10/12/20 1157 10/13/20 0212 10/13/20 0604  GLUCAP 148* 147* 152* 148* 126*    SpO2: 94 %    Vitals:   10/13/20 0527 10/13/20 0546 10/13/20 0548 10/13/20 0807  BP: 135/75  140/71   Pulse: 90  88   Resp: 20  17   Temp: 97.9 F (36.6 C)     TempSrc: Oral     SpO2: 93%  95% 94%  Weight:  69.8 kg    Height:  4\' 11"  (1.499 m)       Intake/Output Summary (Last 24 hours) at 10/13/2020 0936 Last data filed at 10/13/2020 0837 Gross per 24 hour  Intake 888.52 ml  Output 150 ml  Net 738.52 ml    06/11 1901 - 06/13 0700 In: 828.5 [P.O.:50; I.V.:528.5] Out: 150 [Urine:150]  Filed Weights   10/12/20 0055 10/13/20 0500 10/13/20 0546  Weight: 71.3 kg 72 kg 69.8 kg    CBC:  Recent Labs  Lab 10/10/20 1845 10/11/20 0026  WBC 5.1 5.0  HGB 11.8*  13.2  HCT 36.0 39.3  PLT 198 192  MCV 86.3 84.9  MCH 28.3 28.5  MCHC 32.8 33.6  RDW 15.3 15.0  LYMPHSABS 0.8  --   MONOABS 0.3  --   EOSABS 0.0  --   BASOSABS 0.0  --     Complete metabolic panel:  Recent Labs  Lab 10/10/20 1614 10/10/20 1644 10/10/20 1845 10/10/20 1921 10/11/20 0026 10/12/20 0951 10/13/20 0141  NA  --   --  127*  --  132* 134* 133*  K  --   --  3.7  --  3.1* 4.1 4.1  CL  --   --  94*  --  98 102 102  CO2  --   --  22  --  23 20* 21*  GLUCOSE  --   --  98  --  112* 142* 137*  BUN  --   --  10  --  11 30* 42*  CREATININE  --   --  0.85  --  0.98 1.86* 2.64*  CALCIUM  --   --  9.2  --  9.2 9.5 9.4  AST  --   --  26  --   --    --  28  ALT  --   --  14  --   --   --  15  ALKPHOS  --   --  41  --   --   --  38  BILITOT  --   --  0.5  --   --   --  0.4  ALBUMIN  --   --  3.8  --   --   --  3.4*  LATICACIDVEN  --  1.1  --  1.0  --   --   --   INR  --   --  1.1  --   --   --   --   TSH  --   --   --   --  5.740*  --   --   BNP 231.4*  --   --   --   --   --   --     No results for input(s): LIPASE, AMYLASE in the last 168 hours.  Recent Labs  Lab 10/10/20 1614 10/10/20 1624  BNP 231.4*  --   SARSCOV2NAA  --  NEGATIVE    ------------------------------------------------------------------------------------------------------------------ No results for input(s): CHOL, HDL, LDLCALC, TRIG, CHOLHDL, LDLDIRECT in the last 72 hours.  Lab Results  Component Value Date   HGBA1C 6.1 (H) 02/11/2014   ------------------------------------------------------------------------------------------------------------------ Recent Labs    10/11/20 0026  TSH 5.740*   ------------------------------------------------------------------------------------------------------------------ No results for input(s): VITAMINB12, FOLATE, FERRITIN, TIBC, IRON, RETICCTPCT in the last 72 hours.  Coagulation profile Recent Labs  Lab 10/10/20 1845  INR 1.1   No results for input(s): DDIMER in the last 72 hours.  Cardiac Enzymes No results for input(s): CKTOTAL, CKMB, CKMBINDEX, TROPONINI in the last 168 hours.  ------------------------------------------------------------------------------------------------------------------    Component Value Date/Time   BNP 231.4 (H) 10/10/2020 1614     Antibiotics: Anti-infectives (From admission, onward)    Start     Dose/Rate Route Frequency Ordered Stop   10/12/20 2200  vancomycin (VANCOREADY) IVPB 1000 mg/200 mL  Status:  Discontinued        1,000 mg 200 mL/hr over 60 Minutes Intravenous Every 48 hours 10/10/20 2103 10/10/20 2229   10/12/20 2200  azithromycin (ZITHROMAX) tablet 500 mg  500 mg Oral Daily 10/12/20 1214 10/15/20 2159   10/11/20 0800  ceFEPIme (MAXIPIME) 2 g in sodium chloride 0.9 % 100 mL IVPB  Status:  Discontinued        2 g 200 mL/hr over 30 Minutes Intravenous Every 12 hours 10/10/20 2103 10/10/20 2229   10/11/20 0800  cefTRIAXone (ROCEPHIN) 2 g in sodium chloride 0.9 % 100 mL IVPB  Status:  Discontinued        2 g 200 mL/hr over 30 Minutes Intravenous Every 24 hours 10/10/20 2229 10/11/20 0908   10/10/20 2230  azithromycin (ZITHROMAX) 500 mg in sodium chloride 0.9 % 250 mL IVPB  Status:  Discontinued        500 mg 250 mL/hr over 60 Minutes Intravenous Every 24 hours 10/10/20 2229 10/12/20 1214   10/10/20 2015  vancomycin (VANCOREADY) IVPB 1250 mg/250 mL        1,250 mg 166.7 mL/hr over 90 Minutes Intravenous  Once 10/10/20 1944 10/10/20 2303   10/10/20 1945  ceFEPIme (MAXIPIME) 2 g in sodium chloride 0.9 % 100 mL IVPB        2 g 200 mL/hr over 30 Minutes Intravenous  Once 10/10/20 1944 10/10/20 2106        Radiology Reports  No results found.    DVT prophylaxis: Lovenox  Code Status: DNR  Family Communication: Discussed with son at bedside   Consultants:   Procedures:     Objective    Physical Examination:  General-appears pleasantly confused Heart-S1-S2, regular, no murmur auscultated Lungs-bilateral rhonchi auscultated Abdomen-soft, nontender, no organomegaly Extremities-no edema in the lower extremities Neuro-alert, not oriented x3, confused  Status is: Inpatient  Dispo: The patient is from: Home              Anticipated d/c is to: Home              Anticipated d/c date is: 10/13/2020              Patient currently not stable for discharge  Barrier to discharge-treatment for acute bronchitis, acute kidney injury  COVID-19 Labs  No results for input(s): DDIMER, FERRITIN, LDH, CRP in the last 72 hours.  Lab Results  Component Value Date   SARSCOV2NAA NEGATIVE 10/10/2020   SARSCOV2NAA NEGATIVE 02/21/2020     Microbiology  Recent Results (from the past 240 hour(s))  Resp Panel by RT-PCR (Flu A&B, Covid) Nasopharyngeal Swab     Status: None   Collection Time: 10/10/20  4:24 PM   Specimen: Nasopharyngeal Swab; Nasopharyngeal(NP) swabs in vial transport medium  Result Value Ref Range Status   SARS Coronavirus 2 by RT PCR NEGATIVE NEGATIVE Final    Comment: (NOTE) SARS-CoV-2 target nucleic acids are NOT DETECTED.  The SARS-CoV-2 RNA is generally detectable in upper respiratory specimens during the acute phase of infection. The lowest concentration of SARS-CoV-2 viral copies this assay can detect is 138 copies/mL. A negative result does not preclude SARS-Cov-2 infection and should not be used as the sole basis for treatment or other patient management decisions. A negative result may occur with  improper specimen collection/handling, submission of specimen other than nasopharyngeal swab, presence of viral mutation(s) within the areas targeted by this assay, and inadequate number of viral copies(<138 copies/mL). A negative result must be combined with clinical observations, patient history, and epidemiological information. The expected result is Negative.  Fact Sheet for Patients:  BloggerCourse.comhttps://www.fda.gov/media/152166/download  Fact Sheet for Healthcare Providers:  SeriousBroker.ithttps://www.fda.gov/media/152162/download  This test is no t yet approved  or cleared by the [place] and  has been authorized for detection and/or diagnosis of SARS-CoV-2 by FDA under an Emergency Use Authorization (EUA). This EUA will remain  in effect (meaning this test can be used) for the duration of the COVID-19 declaration under Section 564(b)(1) of the Act, 21 U.S.C.section 360bbb-3(b)(1), unless the authorization is terminated  or revoked sooner.       Influenza A by PCR NEGATIVE NEGATIVE Final   Influenza B by PCR NEGATIVE NEGATIVE Final    Comment: (NOTE) The Xpert Xpress SARS-CoV-2/FLU/RSV plus assay  is intended as an aid in the diagnosis of influenza from Nasopharyngeal swab specimens and should not be used as a sole basis for treatment. Nasal washings and aspirates are unacceptable for Xpert Xpress SARS-CoV-2/FLU/RSV testing.  Fact Sheet for Patients: BloggerCourse.com  Fact Sheet for Healthcare Providers: SeriousBroker.it  This test is not yet approved or cleared by the [place] FDA and has been authorized for detection and/or diagnosis of SARS-CoV-2 by FDA under an Emergency Use Authorization (EUA). This EUA will remain in effect (meaning this test can be used) for the duration of the COVID-19 declaration under Section 564(b)(1) of the Act, 21 U.S.C. section 360bbb-3(b)(1), unless the authorization is terminated or revoked.  Performed at [facility], [address], [place], [place] [address]   Blood Culture (routine x 2)     Status: None (Preliminary result)   Collection Time: 10/10/20  4:44 PM   Specimen: BLOOD  Result Value Ref Range Status   Specimen Description BLOOD LEFT ANTECUBITAL  Final   Special Requests   Final    BOTTLES DRAWN AEROBIC AND ANAEROBIC Blood Culture results may not be optimal due to an inadequate volume of blood received in culture bottles   Culture   Final    NO GROWTH 3 DAYS Performed at [facility], [address], [place], [place] [address]    Report Status PENDING  Incomplete  Blood Culture (routine x 2)     Status: None (Preliminary result)   Collection Time: 10/10/20  4:44 PM   Specimen: BLOOD RIGHT HAND  Result Value Ref Range Status   Specimen Description BLOOD RIGHT HAND  Final   Special Requests   Final    BOTTLES DRAWN AEROBIC AND ANAEROBIC Blood Culture adequate volume   Culture   Final    NO GROWTH 3 DAYS Performed at [facility], [address] [place], [place] [address]    Report Status PENDING  Incomplete  Urine culture     Status: Abnormal    Collection Time: 10/10/20  6:52 PM   Specimen: In/Out Cath Urine  Result Value Ref Range Status   Specimen Description IN/OUT CATH URINE  Final   Special Requests   Final    NONE Performed at [facility], [address], [place], [place] [address]    Culture MULTIPLE SPECIES PRESENT, SUGGEST RECOLLECTION (A)  Final   Report Status 10/12/2020 FINAL  Final             Meredeth Ide   [facility] If 7PM-7AM, please contact night-coverage at [email or web address], Office  [phone]   10/13/2020, 9:36 AM  LOS: 0 days

## 2020-10-13 NOTE — Evaluation (Signed)
Physical Therapy Evaluation Patient Details Name: [name] [name] MRN: [id number] DOB: 11-29-1932 Today's Date: 10/13/2020   History of Present Illness  87 y.o. female presents to [facility] ED on 10/10/2020 with reports of SOB and cough. Pt found to be febrila in ED. PMH includes advanced dementia, CAD s/p CABG, resolved ischemic cardiomyopathy after CABG, hypertension.  Clinical Impression  Pt presents to PT with deficits in functional mobility, gait, balance, power, endurance, cardiopulmonary function, and with significant baseline cognitive deficits. Pt requires physical assistance to initiate all mobility and to prevent falls during session. Pt demonstrates a strong posterior lean with all transfers and ambulation attempts. Pt with ronchus breath sounds throughout session, PT providing education on upright positioning to aide in improving pulmonary function. Pt will benefit from aggressive mobilization and acute PT services to improve transfer quality and reduce falls risk. PT recommends SNF placement initially, family declining and preferring discharge home at this time. PT equipment recommendations for home noted below.     Follow Up Recommendations Home health PT;Supervision/Assistance - 24 hour (PT initially recommending SNF however pt's daughter declines, preferring to D/C home)    Equipment Recommendations  Wheelchair (measurements PT);Hospital bed    Recommendations for Other Services       Precautions / Restrictions Precautions Precautions: Fall Precaution Comments: advanced dementia Restrictions Weight Bearing Restrictions: No      Mobility  Bed Mobility Overal bed mobility: Needs Assistance Bed Mobility: Supine to Sit;Sit to Supine     Supine to sit: Mod assist;HOB elevated Sit to supine: Total assist   General bed mobility comments: PT assists pt from sit to supine with total A as pt nearing edge of bed and not responding to commands to scoot back further onto bed     Transfers Overall transfer level: Needs assistance Equipment used: 1 person hand held assist;2 person hand held assist Transfers: Sit to/from Stand Sit to Stand: Mod assist;Min assist;+2 physical assistance         General transfer comment: pt performs 4 sit to stands during session, modA of 1 or minA of 2, tendency for posterior lean. Requires verbal and tactile cues to initiate attempts  Ambulation/Gait Ambulation/Gait assistance: Mod assist;+2 physical assistance Gait Distance (Feet): 3 Feet Assistive device: 2 person hand held assist Gait Pattern/deviations: Shuffle Gait velocity: reduced Gait velocity interpretation: <1.31 ft/sec, indicative of household ambulator General Gait Details: pt with slow step-to gait, reduced foot clearance bilaterally, PT providing physical assist to weight shift aiding in initiation of steps  Stairs            Wheelchair Mobility    Modified Rankin (Stroke Patients Only)       Balance Overall balance assessment: Needs assistance Sitting-balance support: No upper extremity supported;Feet supported Sitting balance-Leahy Scale: Fair     Standing balance support: Single extremity supported;Bilateral upper extremity supported Standing balance-Leahy Scale: Poor Standing balance comment: modA due to posterior lean                             Pertinent Vitals/Pain Pain Assessment: Faces Pain Score: 0-No pain (pt verbally denies pain) Faces Pain Scale: Hurts little more Pain Location: generalized Pain Descriptors / Indicators: Other (Comment) (pt is very tense throughout session, decorticate positioning of UEs when resting) Pain Intervention(s): Monitored during session    Home Living Family/patient expects to be discharged to:: Private residence Living Arrangements: Children Available Help at Discharge: Family;Available 24 hours/day Type of Home: House Home  Access: Stairs to enter Entrance Stairs-Rails:  None Entrance Stairs-Number of Steps: 1 Home Layout: One level Home Equipment: Walker - 2 wheels;Shower seat;Cane - single point;Bedside commode      Prior Function Level of Independence: Needs assistance   Gait / Transfers Assistance Needed: pt ambulates around the house independently without device  ADL's / Homemaking Assistance Needed: supervision forn showers, setup for dressing        Hand Dominance        Extremity/Trunk Assessment   Upper Extremity Assessment Upper Extremity Assessment: RUE deficits/detail;LUE deficits/detail RUE Deficits / Details: good strength in BUE, pt is very tense, often with tight grip on mittens or with decorticate positioning of BUEs LUE Deficits / Details: good strength in BUE, pt is very tense, often with tight grip on mittens or with decorticate positioning of BUEs    Lower Extremity Assessment Lower Extremity Assessment: Generalized weakness    Cervical / Trunk Assessment Cervical / Trunk Assessment: Kyphotic  Communication   Communication:  (delayed response times, responses of 1-2 word phrases)  Cognition Arousal/Alertness: Awake/alert Behavior During Therapy: Flat affect Overall Cognitive Status: History of cognitive impairments - at baseline                                 General Comments: pt with advanced dementia at baseline, daughter present. Pt with increased time to process and follow commands, needing multiple verbal and tactile cues to follow simple one-step commands. Pt demonstrates limited awareness of deficits currently.      General Comments General comments (skin integrity, edema, etc.): VSS on RA, SpO2 94%. PT expresses concern to RN and charge nurse over ronchi present throughout session, worsening slightly by end of session    Exercises     Assessment/Plan    PT Assessment Patient needs continued PT services  PT Problem List Decreased strength;Decreased activity tolerance;Decreased  balance;Decreased mobility;Decreased cognition;Decreased knowledge of use of DME;Decreased safety awareness;Decreased knowledge of precautions       PT Treatment Interventions DME instruction;Gait training;Functional mobility training;Therapeutic activities;Stair training;Therapeutic exercise;Balance training;Patient/family education;Wheelchair mobility training;Cognitive remediation    PT Goals (Current goals can be found in the Care Plan section)  Acute Rehab PT Goals Patient Stated Goal: to improve transfer and gait quality in an effort to reduce caregiver burden and falls risk PT Goal Formulation: With patient/family Time For Goal Achievement: 10/27/20 Potential to Achieve Goals: Fair    Frequency Min 3X/week (family declining SNF recommendation)   Barriers to discharge        Co-evaluation               AM-PAC PT "6 Clicks" Mobility  Outcome Measure Help needed turning from your back to your side while in a flat bed without using bedrails?: A Lot Help needed moving from lying on your back to sitting on the side of a flat bed without using bedrails?: A Lot Help needed moving to and from a bed to a chair (including a wheelchair)?: A Lot Help needed standing up from a chair using your arms (e.g., wheelchair or bedside chair)?: A Lot Help needed to walk in hospital room?: A Lot Help needed climbing 3-5 steps with a railing? : Total 6 Click Score: 11    End of Session   Activity Tolerance: Patient tolerated treatment well Patient left: in bed;with call bell/phone within reach;with bed alarm set;with family/visitor present;with restraints reapplied Nurse Communication: Mobility status PT Visit  Diagnosis: Unsteadiness on feet (R26.81);Other abnormalities of gait and mobility (R26.89);Muscle weakness (generalized) (M62.81)    Time: 5956-3875 PT Time Calculation (min) (ACUTE ONLY): 19 min   Charges:   PT Evaluation $PT Eval Low Complexity: 1 Low          Arlyss Gandy, PT, DPT Acute Rehabilitation Pager: [phone]   Arlyss Gandy 10/13/2020, 12:41 PM

## 2020-10-14 DIAGNOSIS — N179 Acute kidney failure, unspecified: Secondary | ICD-10-CM

## 2020-10-14 LAB — GLUCOSE, CAPILLARY
Glucose-Capillary: 103 mg/dL — ABNORMAL HIGH (ref 70–99)
Glucose-Capillary: 104 mg/dL — ABNORMAL HIGH (ref 70–99)
Glucose-Capillary: 106 mg/dL — ABNORMAL HIGH (ref 70–99)
Glucose-Capillary: 117 mg/dL — ABNORMAL HIGH (ref 70–99)
Glucose-Capillary: 88 mg/dL (ref 70–99)

## 2020-10-14 LAB — BASIC METABOLIC PANEL
Anion gap: 9 (ref 5–15)
BUN: 22 mg/dL (ref 8–23)
CO2: 22 mmol/L (ref 22–32)
Calcium: 9 mg/dL (ref 8.9–10.3)
Chloride: 108 mmol/L (ref 98–111)
Creatinine, Ser: 0.96 mg/dL (ref 0.44–1.00)
GFR, Estimated: 58 mL/min — ABNORMAL LOW (ref >60)
Glucose, Bld: 108 mg/dL — ABNORMAL HIGH (ref 70–99)
Potassium: 3.7 mmol/L (ref 3.5–5.1)
Sodium: 139 mmol/L (ref 135–145)

## 2020-10-14 LAB — URINE CULTURE: Culture: NO GROWTH

## 2020-10-14 MED ORDER — IPRATROPIUM-ALBUTEROL 0.5-2.5 (3) MG/3ML IN SOLN
3.0000 mL | Freq: Two times a day (BID) | RESPIRATORY_TRACT | Status: DC
  Administered 2020-10-14 – 2020-10-16 (×5): 3 mL via RESPIRATORY_TRACT
  Filled 2020-10-14 (×5): qty 3

## 2020-10-14 MED ORDER — DEXTROSE 5 % IV SOLN
250.0000 mg | INTRAVENOUS | Status: DC
  Administered 2020-10-14 – 2020-10-15 (×2): 250 mg via INTRAVENOUS
  Filled 2020-10-14 (×4): qty 250

## 2020-10-14 MED ORDER — HALOPERIDOL LACTATE 5 MG/ML IJ SOLN
2.0000 mg | Freq: Once | INTRAMUSCULAR | Status: AC
  Administered 2020-10-14: 2 mg via INTRAVENOUS
  Filled 2020-10-14: qty 1

## 2020-10-14 NOTE — Progress Notes (Signed)
    Durable Medical Equipment  (From admission, onward)           Start     Ordered   10/14/20 1028  For home use only DME lightweight manual wheelchair with seat cushion  Once       Comments: Patient suffers from weakness, chf which impairs their ability to perform daily activities like bathing, dressing, feeding, grooming, and toileting in the home.  A cane or walker will not resolve  issue with performing activities of daily living. A wheelchair will allow patient to safely perform daily activities. Patient is not able to propel themselves in the home using a standard weight wheelchair due to general weakness. Patient can self propel in the lightweight wheelchair. Length of need Lifetime. Accessories: elevating leg rests (ELRs), wheel locks, extensions and anti-tippers.   10/14/20 1029   10/14/20 1027  For home use only DME Hospital bed  Once       Question Answer Comment  Length of Need Lifetime   Patient has (list medical condition): chf   The above medical condition requires: Patient requires the ability to reposition frequently   Head must be elevated greater than: 30 degrees   Bed type Semi-electric   Hoyer Lift Yes   Support Surface: Gel Overlay      10/14/20 1027

## 2020-10-14 NOTE — Progress Notes (Signed)
Brief note:  Labs reviewed.  Cr 0.96, K 3.7, CO2 22, Ca 9.0 this AM. Renal US with probable R hydro and 1.3 cm nonobstructing L renal calculus  Recommend leaving Foley in for now during hospitalization for decompression of bladder.   May require urology evaluation if Foley removed and TOV unsuccessful.  Nothing further to add.  Will sign off.  Call with questions.    Bufford Buttner MD [employer] [facility] [phone]

## 2020-10-14 NOTE — Plan of Care (Signed)
  Problem: Clinical Measurements: Goal: Ability to maintain clinical measurements within normal limits will improve Outcome: Not Progressing Goal: Will remain free from infection Outcome: Not Progressing   

## 2020-10-14 NOTE — Progress Notes (Addendum)
[facility]  PROGRESS NOTE  [name] [name] MWN:[id number] DOB: 1933/01/10 DOA: 10/10/2020 PCP: [name], NP   Brief HPI:   87 year old female with history of advanced dementia, CAD s/p CABG, resolved ischemic cardiomyopathy after CABG, hypertension who has been experiencing shortness of breath for past 2 to 3 days.  In the ED she was found to be febrile with temperature 102 degrees Fahrenheit, diffuse wheezing, chest x-ray did not show infiltrates, lab work showed BNP of 231, lactic acid was normal.  Patient was started on empiric antibiotics for possible pneumonia and also received 1 dose of Lasix for possible CHF.    Subjective   Patient seen and examined, somnolent this morning.  She was agitated yesterday, found to have acute urinary retention, Foley catheter was inserted.  Renal function has come back to normal.   Assessment/Plan:     Acute bronchitis -Presented with wheezing; improved -She was started on Solu-Medrol 40 mg IV every 12 hours, which has been discontinued since yesterday due to worsening confusion.   -DuoNeb nebulizers every 6 hours -Discontinued  ceftriaxone; continue Zithromax  Dementia with behavioral disturbance -Likely from combination of acute urinary retention and steroids -Started on Haldol 2 mg p.o./IM every 6 hours as needed -Likely exacerbated probably due to Solu-Medrol as above.  Solu-Medrol has been discontinued. -We will order one-to-one sitter at bedside -Repeat urine culture is negative  Acute kidney injury -Creatinine elevated was elevated to 1.86 yesterday she was started on gentle IV hydration with normal saline at 50 mill per hour.  Creatinine went up to 2.64.  Nephrology was consulted.   -Renal ultrasound showed mild right hydronephrosis, probable nonobstructive 1.3 cm left upper calculus -Bladder scan showed 999 mL of urine; Foley catheter was inserted.  -Creatinine is down to 0.96 -Nephrology has signed  off   Hypothyroidism -Continue Synthroid   Hypertension -Blood pressure is stable -Continue amlodipine   History of CAD s/p CABG -Ischemic cardiomyopathy resolved after CABG -Continue Zetia   Hyponatremia -Improved.    Scheduled medications:    amLODipine  10 mg Oral Daily   Chlorhexidine Gluconate Cloth  6 each Topical Daily   enoxaparin (LOVENOX) injection  30 mg Subcutaneous QHS   ezetimibe  10 mg Oral Daily   ipratropium-albuterol  3 mL Nebulization BID   latanoprost  1 drop Left Eye QHS   levothyroxine  100 mcg Oral QAC breakfast         Data Reviewed:   CBG:  Recent Labs  Lab 10/13/20 0604 10/13/20 1144 10/14/20 0304 10/14/20 0600 10/14/20 1115  GLUCAP 126* 132* 117* 103* 104*    SpO2: 94 %    Vitals:   10/14/20 0752 10/14/20 1030 10/14/20 1135 10/14/20 1249  BP:  (!) 114/52  126/79  Pulse:      Resp:   (!) 25   Temp:   (!) 100.5 F (38.1 C) 98.9 F (37.2 C)  TempSrc:   Oral Oral  SpO2: 96%  94%   Weight:      Height:         Intake/Output Summary (Last 24 hours) at 10/14/2020 1344 Last data filed at 10/14/2020 1000 Gross per 24 hour  Intake 1690.02 ml  Output 1475 ml  Net 215.02 ml    06/12 1901 - 06/14 0700 In: 2194.6 [P.O.:230; I.V.:1739.6] Out: 3425 [Urine:3425]  Filed Weights   10/13/20 0500 10/13/20 0546 10/14/20 0339  Weight: 72 kg 69.8 kg 68.5 kg    CBC:  Recent Labs  Lab  10/10/20 1845 10/11/20 0026 10/13/20 0233  WBC 5.1 5.0 12.1*  HGB 11.8* 13.2 12.4  HCT 36.0 39.3 37.3  PLT 198 192 233  MCV 86.3 84.9 85.7  MCH 28.3 28.5 28.5  MCHC 32.8 33.6 33.2  RDW 15.3 15.0 15.6*  LYMPHSABS 0.8  --   --   MONOABS 0.3  --   --   EOSABS 0.0  --   --   BASOSABS 0.0  --   --     Complete metabolic panel:  Recent Labs  Lab 10/10/20 1614 10/10/20 1644 10/10/20 1845 10/10/20 1921 10/11/20 0026 10/12/20 0951 10/13/20 0141 10/14/20 0323  NA  --   --  127*  --  132* 134* 133* 139  K  --   --  3.7  --  3.1*  4.1 4.1 3.7  CL  --   --  94*  --  98 102 102 108  CO2  --   --  22  --  23 20* 21* 22  GLUCOSE  --   --  98  --  112* 142* 137* 108*  BUN  --   --  10  --  11 30* 42* 22  CREATININE  --   --  0.85  --  0.98 1.86* 2.64* 0.96  CALCIUM  --   --  9.2  --  9.2 9.5 9.4 9.0  AST  --   --  26  --   --   --  28  --   ALT  --   --  14  --   --   --  15  --   ALKPHOS  --   --  41  --   --   --  38  --   BILITOT  --   --  0.5  --   --   --  0.4  --   ALBUMIN  --   --  3.8  --   --   --  3.4*  --   LATICACIDVEN  --  1.1  --  1.0  --   --   --   --   INR  --   --  1.1  --   --   --   --   --   TSH  --   --   --   --  5.740*  --   --   --   BNP 231.4*  --   --   --   --   --   --   --     No results for input(s): LIPASE, AMYLASE in the last 168 hours.  Recent Labs  Lab 10/10/20 1614 10/10/20 1624  BNP 231.4*  --   SARSCOV2NAA  --  NEGATIVE    ------------------------------------------------------------------------------------------------------------------ No results for input(s): CHOL, HDL, LDLCALC, TRIG, CHOLHDL, LDLDIRECT in the last 72 hours.  Lab Results  Component Value Date   HGBA1C 6.1 (H) 02/11/2014   ------------------------------------------------------------------------------------------------------------------ No results for input(s): TSH, T4TOTAL, T3FREE, THYROIDAB in the last 72 hours.  Invalid input(s): FREET3  ------------------------------------------------------------------------------------------------------------------ No results for input(s): VITAMINB12, FOLATE, FERRITIN, TIBC, IRON, RETICCTPCT in the last 72 hours.  Coagulation profile Recent Labs  Lab 10/10/20 1845  INR 1.1   No results for input(s): DDIMER in the last 72 hours.  Cardiac Enzymes No results for input(s): CKTOTAL, CKMB, CKMBINDEX, TROPONINI in the last 168 hours.  ------------------------------------------------------------------------------------------------------------------     Component Value Date/Time   BNP 231.4 (H)  10/10/2020 1614     Antibiotics: Anti-infectives (From admission, onward)    Start     Dose/Rate Route Frequency Ordered Stop   10/14/20 0100  azithromycin (ZITHROMAX) 250 mg in dextrose 5 % 125 mL IVPB        250 mg 125 mL/hr over 60 Minutes Intravenous Every 24 hours 10/14/20 0008     10/13/20 1215  cefTRIAXone (ROCEPHIN) 1 g in sodium chloride 0.9 % 100 mL IVPB        1 g 200 mL/hr over 30 Minutes Intravenous Every 24 hours 10/13/20 1125     10/12/20 2200  vancomycin (VANCOREADY) IVPB 1000 mg/200 mL  Status:  Discontinued        1,000 mg 200 mL/hr over 60 Minutes Intravenous Every 48 hours 10/10/20 2103 10/10/20 2229   10/12/20 2200  azithromycin (ZITHROMAX) tablet 500 mg  Status:  Discontinued        500 mg Oral Daily 10/12/20 1214 10/14/20 0008   10/11/20 0800  ceFEPIme (MAXIPIME) 2 g in sodium chloride 0.9 % 100 mL IVPB  Status:  Discontinued        2 g 200 mL/hr over 30 Minutes Intravenous Every 12 hours 10/10/20 2103 10/10/20 2229   10/11/20 0800  cefTRIAXone (ROCEPHIN) 2 g in sodium chloride 0.9 % 100 mL IVPB  Status:  Discontinued        2 g 200 mL/hr over 30 Minutes Intravenous Every 24 hours 10/10/20 2229 10/11/20 0908   10/10/20 2230  azithromycin (ZITHROMAX) 500 mg in sodium chloride 0.9 % 250 mL IVPB  Status:  Discontinued        500 mg 250 mL/hr over 60 Minutes Intravenous Every 24 hours 10/10/20 2229 10/12/20 1214   10/10/20 2015  vancomycin (VANCOREADY) IVPB 1250 mg/250 mL        1,250 mg 166.7 mL/hr over 90 Minutes Intravenous  Once 10/10/20 1944 10/10/20 2303   10/10/20 1945  ceFEPIme (MAXIPIME) 2 g in sodium chloride 0.9 % 100 mL IVPB        2 g 200 mL/hr over 30 Minutes Intravenous  Once 10/10/20 1944 10/10/20 2106        Radiology Reports  US RENAL  Result Date: 10/13/2020 CLINICAL DATA:  Acute kidney injury EXAM: RENAL / URINARY TRACT ULTRASOUND COMPLETE COMPARISON:  None. FINDINGS: Technical note:  Examination technically limited by patient condition and inability to breath hold. Right Kidney: Renal measurements: 11.4 x 4.4 x 6.0 cm = volume: 157 mL. Echogenicity within normal limits. Mild hydronephrosis. No mass or shadowing stone visualized. Left Kidney: Renal measurements: 10.1 x 4.8 x 5.2 cm = volume: 133 mL. Echogenicity within normal limits. 13 mm echogenic, shadowing calcification the upper pole of the left kidney. No mass or hydronephrosis visualized. Bladder: Decompressed by Foley catheter. Other: None. IMPRESSION: 1. Limited exam. 2. Mild right hydronephrosis. 3. Probable nonobstructing 1.3 cm left upper calculus. Electronically Signed   By: Duanne Guess D.O.   On: 10/13/2020 18:13      DVT prophylaxis: Lovenox  Code Status: DNR  Family Communication: Discussed with daughter at bedside   Consultants:   Procedures:     Objective    Physical Examination:  General-appears lethargic Heart-S1-S2, regular, no murmur auscultated Lungs-clear to auscultation bilaterally, no wheezing or crackles auscultated Abdomen-soft, nontender, no organomegaly Extremities-no edema in the lower extremities Neuro-somnolent but arousable to verbal stimuli  Status is: Inpatient  Dispo: The patient is from: Home  Anticipated d/c is to: Home              Anticipated d/c date is: 10/15/2020              Patient currently not stable for discharge  Barrier to discharge-treatment for acute bronchitis, acute kidney injury  COVID-19 Labs  No results for input(s): DDIMER, FERRITIN, LDH, CRP in the last 72 hours.  Lab Results  Component Value Date   SARSCOV2NAA NEGATIVE 10/10/2020   SARSCOV2NAA NEGATIVE 02/21/2020    Microbiology  Recent Results (from the past 240 hour(s))  Resp Panel by RT-PCR (Flu A&B, Covid) Nasopharyngeal Swab     Status: None   Collection Time: 10/10/20  4:24 PM   Specimen: Nasopharyngeal Swab; Nasopharyngeal(NP) swabs in vial transport medium   Result Value Ref Range Status   SARS Coronavirus 2 by RT PCR NEGATIVE NEGATIVE Final    Comment: (NOTE) SARS-CoV-2 target nucleic acids are NOT DETECTED.  The SARS-CoV-2 RNA is generally detectable in upper respiratory specimens during the acute phase of infection. The lowest concentration of SARS-CoV-2 viral copies this assay can detect is 138 copies/mL. A negative result does not preclude SARS-Cov-2 infection and should not be used as the sole basis for treatment or other patient management decisions. A negative result may occur with  improper specimen collection/handling, submission of specimen other than nasopharyngeal swab, presence of viral mutation(s) within the areas targeted by this assay, and inadequate number of viral copies(<138 copies/mL). A negative result must be combined with clinical observations, patient history, and epidemiological information. The expected result is Negative.  Fact Sheet for Patients:  BloggerCourse.com  Fact Sheet for Healthcare Providers:  SeriousBroker.it  This test is no t yet approved or cleared by the [place] FDA and  has been authorized for detection and/or diagnosis of SARS-CoV-2 by FDA under an Emergency Use Authorization (EUA). This EUA will remain  in effect (meaning this test can be used) for the duration of the COVID-19 declaration under Section 564(b)(1) of the Act, 21 U.S.C.section 360bbb-3(b)(1), unless the authorization is terminated  or revoked sooner.       Influenza A by PCR NEGATIVE NEGATIVE Final   Influenza B by PCR NEGATIVE NEGATIVE Final    Comment: (NOTE) The Xpert Xpress SARS-CoV-2/FLU/RSV plus assay is intended as an aid in the diagnosis of influenza from Nasopharyngeal swab specimens and should not be used as a sole basis for treatment. Nasal washings and aspirates are unacceptable for Xpert Xpress SARS-CoV-2/FLU/RSV testing.  Fact Sheet for  Patients: BloggerCourse.com  Fact Sheet for Healthcare Providers: SeriousBroker.it  This test is not yet approved or cleared by the [place] FDA and has been authorized for detection and/or diagnosis of SARS-CoV-2 by FDA under an Emergency Use Authorization (EUA). This EUA will remain in effect (meaning this test can be used) for the duration of the COVID-19 declaration under Section 564(b)(1) of the Act, 21 U.S.C. section 360bbb-3(b)(1), unless the authorization is terminated or revoked.  Performed at [facility], [address] [place], [place] [address]   Blood Culture (routine x 2)     Status: None (Preliminary result)   Collection Time: 10/10/20  4:44 PM   Specimen: BLOOD  Result Value Ref Range Status   Specimen Description BLOOD LEFT ANTECUBITAL  Final   Special Requests   Final    BOTTLES DRAWN AEROBIC AND ANAEROBIC Blood Culture results may not be optimal due to an inadequate volume of blood received in culture bottles  Culture   Final    NO GROWTH 4 DAYS Performed at [facility], [address] [place], [place] [address]    Report Status PENDING  Incomplete  Blood Culture (routine x 2)     Status: None (Preliminary result)   Collection Time: 10/10/20  4:44 PM   Specimen: BLOOD RIGHT HAND  Result Value Ref Range Status   Specimen Description BLOOD RIGHT HAND  Final   Special Requests   Final    BOTTLES DRAWN AEROBIC AND ANAEROBIC Blood Culture adequate volume   Culture   Final    NO GROWTH 4 DAYS Performed at [facility], [address] [place], [place] [address]    Report Status PENDING  Incomplete  Urine culture     Status: Abnormal   Collection Time: 10/10/20  6:52 PM   Specimen: In/Out Cath Urine  Result Value Ref Range Status   Specimen Description IN/OUT CATH URINE  Final   Special Requests   Final    NONE Performed at [facility], [address] [place], [place]  [address]    Culture MULTIPLE SPECIES PRESENT, SUGGEST RECOLLECTION (A)  Final   Report Status 10/12/2020 FINAL  Final  Culture, Urine     Status: None   Collection Time: 10/13/20 12:17 PM   Specimen: Urine, Clean Catch  Result Value Ref Range Status   Specimen Description URINE, CLEAN CATCH  Final   Special Requests NONE  Final   Culture   Final    NO GROWTH Performed at [facility], [address] [place], [place] [address]    Report Status 10/14/2020 FINAL  Final             Meredeth IdeGagan S Bucky Grigg   [facility] If 7PM-7AM, please contact night-coverage at [email or web address], Office  [phone]   10/14/2020, 1:44 PM  LOS: 1 day

## 2020-10-14 NOTE — Progress Notes (Signed)
Occupational Therapy Evaluation Patient Details Name: [name] [name] MRN: [id number] DOB: 1932-08-23 Today's Date: 10/14/2020    History of Present Illness 87 y.o. female presents to [facility] ED on 10/10/2020 with reports of SOB and cough. Pt found to be febrila in ED. PMH includes advanced dementia, CAD s/p CABG, resolved ischemic cardiomyopathy after CABG, hypertension.   Clinical Impression   Pt was attempted three trials due to level of arousal. Pt in session opened their eyes two times with stimulus applied and increase environmental stimulus. Pt required total assist x2 at bed level for sponge bath and rolling side to side in bed. Pt's daughter was present and at this time they want to take the patient home instead of any SNF setting therapy.     Follow Up Recommendations  Home health OT;Supervision/Assistance - 24 hour;Other (comment) (it is recomended to have SNF level but daughter [name] reported they want to go home)    Equipment Recommendations  Hospital bed    Recommendations for Other Services       Precautions / Restrictions Precautions Precautions: Fall Precaution Comments: advanced dementia Restrictions Weight Bearing Restrictions: No      Mobility Bed Mobility Overal bed mobility: Needs Assistance Bed Mobility: Rolling Rolling: Total assist;+2 for physical assistance;+2 for safety/equipment              Transfers                      Balance                                           ADL either performed or assessed with clinical judgement   ADL Overall ADL's : Needs assistance/impaired Eating/Feeding: Maximal assistance;Cueing for safety;Cueing for sequencing (per daughter pt took only 3 bites of food today)   Grooming: Total assistance;Bed level   Upper Body Bathing: Total assistance;Bed level   Lower Body Bathing: Total assistance;Bed level   Upper Body Dressing : Total assistance;Bed level   Lower Body Dressing: Total  assistance;Bed level                 General ADL Comments: attempted 3 times with the patient to trial session due to level of arousal. Pt during session opened eyes x2 and mubled one time. Pt has bed bath completed x2 in bed level with total assist     Vision         Perception     Praxis      Pertinent Vitals/Pain Pain Assessment: No/denies pain     Hand Dominance Right   Extremity/Trunk Assessment Upper Extremity Assessment Upper Extremity Assessment: Difficult to assess due to impaired cognition   Lower Extremity Assessment Lower Extremity Assessment: Defer to PT evaluation       Communication     Cognition Arousal/Alertness: Lethargic   Overall Cognitive Status: History of cognitive impairments - at baseline                                 General Comments: pt with advanced dementia at baseline, daughter present. Pt with increased time to process and follow commands, needing multiple verbal and tactile cues to follow simple one-step commands. Pt demonstrates limited awareness of deficits currently.   General Comments       Exercises  Shoulder Instructions      Home Living Family/patient expects to be discharged to:: Private residence Living Arrangements: Children Available Help at Discharge: Family;Available 24 hours/day Type of Home: House Home Access: Stairs to enter [employer] of Steps: 1 Entrance Stairs-Rails: None Home Layout: One level     Bathroom Shower/Tub: Tub/shower unit         Home Equipment: Environmental consultant - 2 wheels;Shower seat;Cane - single point;Bedside commode          Prior Functioning/Environment Level of Independence: Needs assistance  Gait / Transfers Assistance Needed: pt ambulates around the house independently without device ADL's / Homemaking Assistance Needed: supervision forn showers, setup for dressing   Comments: Pt's son reports pt is pleasantly confused, has her own routine but  requires cues to complete tasks occasionally        OT Problem List: Decreased range of motion;Decreased activity tolerance;Impaired balance (sitting and/or standing);Decreased cognition;Decreased safety awareness;Decreased knowledge of precautions;Decreased knowledge of use of DME or AE;Cardiopulmonary status limiting activity      OT Treatment/Interventions: Self-care/ADL training;Therapeutic exercise;Energy conservation;Therapeutic activities;Cognitive remediation/compensation;Patient/family education;Balance training    OT Goals(Current goals can be found in the care plan section) Acute Rehab OT Goals Patient Stated Goal: unable to report (daughter reported to be able to take a few steps) OT Goal Formulation: With patient/family Time For Goal Achievement: 10/25/20 Potential to Achieve Goals: Fair ADL Goals Pt Will Perform Eating: sitting;with set-up Pt Will Perform Grooming: with supervision;sitting Pt Will Perform Upper Body Bathing: with min assist;sitting Pt Will Perform Lower Body Bathing: with min assist;sit to/from stand  OT Frequency: Min 2X/week   Barriers to D/C:            Co-evaluation              AM-PAC OT "6 Clicks" Daily Activity     Outcome Measure Help from another person eating meals?: Total Help from another person taking care of personal grooming?: Total Help from another person toileting, which includes using toliet, bedpan, or urinal?: Total Help from another person bathing (including washing, rinsing, drying)?: Total Help from another person to put on and taking off regular upper body clothing?: Total Help from another person to put on and taking off regular lower body clothing?: Total 6 Click Score: 6   End of Session Nurse Communication: Other (comment) (limited ability to participate)  Activity Tolerance: Patient limited by lethargy;Patient limited by fatigue Patient left: in bed;with bed alarm set;with family/visitor present;with  nursing/sitter in room  OT Visit Diagnosis: Unsteadiness on feet (R26.81);Other abnormalities of gait and mobility (R26.89);Muscle weakness (generalized) (M62.81)                Time: 8119-1478 OT Time Calculation (min): 22 min Charges:  OT General Charges $OT Visit: 1 Visit OT Evaluation $OT Eval Low Complexity: 1 Low  Alphia Moh OTR/L  Acute Rehab Services  [phone] office number [phone] pager number   Alphia Moh 10/14/2020, 12:13 PM

## 2020-10-14 NOTE — TOC Progression Note (Addendum)
Transition of Care [facility]) - Progression Note    Patient Details  Name: [name] [name] MRN: [id number] Date of Birth: [birth year]  Transition of [facility]) CM/SW Contact  Leone Haven, RN Phone Number: 10/14/2020, 10:22 AM  Clinical Narrative:    NCM spoke with patient Daughter, [name] at the bedside, NCM offered choice for HHPT, she chose [place].  NCM made referral to [facility] with [facility].  Awaiting to hear back.  [name] states they will need a whellchair and a hospital bed.  Patient lives with her and [name] states she is there 24 hrs /day and she takes care of patient.  NCM made referral to [facility] with [employer] for hospital bed and wheelchair.  Per Kandee Keen with [facility] he can take referral for HHPT.        Expected Discharge Plan and Services                                                 Social Determinants of Health (SDOH) Interventions    Readmission Risk Interventions No flowsheet data found.

## 2020-10-15 LAB — GLUCOSE, CAPILLARY
Glucose-Capillary: 89 mg/dL (ref 70–99)
Glucose-Capillary: 103 mg/dL — ABNORMAL HIGH (ref 70–99)
Glucose-Capillary: 159 mg/dL — ABNORMAL HIGH (ref 70–99)

## 2020-10-15 LAB — CULTURE, BLOOD (ROUTINE X 2)
Culture: NO GROWTH
Culture: NO GROWTH
Special Requests: ADEQUATE

## 2020-10-15 MED ORDER — ENOXAPARIN SODIUM 40 MG/0.4ML IJ SOSY
40.0000 mg | PREFILLED_SYRINGE | Freq: Every day | INTRAMUSCULAR | Status: DC
  Administered 2020-10-15 – 2020-10-16 (×2): 40 mg via SUBCUTANEOUS
  Filled 2020-10-15 (×2): qty 0.4

## 2020-10-15 NOTE — Progress Notes (Signed)
Physical Therapy Treatment Patient Details Name: [name] [name] MRN: [id number] DOB: July 09, 1932 Today's Date: 10/15/2020    History of Present Illness 87 y.o. female presents to [facility] ED on 10/10/2020 with reports of SOB and cough. Pt found to be febrila in ED. PMH includes advanced dementia, CAD s/p CABG, resolved ischemic cardiomyopathy after CABG, hypertension.    PT Comments    Pt with improved activity tolerance and willingness to mobilize initially during session. Pt continues to require physical assistance to mobilize due to imbalance and weakness, often leaning against PT during ambulation. Pt does experience change in level of arousal during session, improved upon return to supine with BP of 135/70, RN and MD made aware. Pt will continue to benefit from acute PT POC to improve functional mobility quality and reduce caregiver burden.  Follow Up Recommendations  Home health PT;Supervision/Assistance - 24 hour     Equipment Recommendations  Wheelchair (measurements PT);Hospital bed    Recommendations for Other Services       Precautions / Restrictions Precautions Precautions: Fall Precaution Comments: advanced dementia, monitor BP Restrictions Weight Bearing Restrictions: No    Mobility  Bed Mobility Overal bed mobility: Needs Assistance Bed Mobility: Supine to Sit     Supine to sit: Max assist;HOB elevated Sit to supine: Total assist   General bed mobility comments: initiation of sup to sit, pt able to pull through BUEs into long sitting and requires assistance to pivot hips to edge of bed. TotalA upon return to bed, pt with no noted effort after possible syncopal episode    Transfers Overall transfer level: Needs assistance Equipment used: 1 person hand held assist Transfers: Sit to/from [employer] Sit to Stand: Mod assist         General transfer comment: PT hand hold along with verbal and tactile cues to initiate sit to stand, posterior lean.  TotalA for final SPT after possible syncopal episode  Ambulation/Gait Ambulation/Gait assistance: Mod assist Gait Distance (Feet): 10 Feet (additional trial of 6', shortened by change in mental status) Assistive device: 1 person hand held assist (chair follow) Gait Pattern/deviations: Shuffle Gait velocity: reduced Gait velocity interpretation: <1.31 ft/sec, indicative of household ambulator General Gait Details: PT providing tactile cueing and facilitation of weight shift.   Stairs             Wheelchair Mobility    Modified Rankin (Stroke Patients Only)       Balance Overall balance assessment: Needs assistance Sitting-balance support: No upper extremity supported;Single extremity supported;Feet supported Sitting balance-Leahy Scale: Poor Sitting balance - Comments: reliant on UE support or minA Postural control: Posterior lean Standing balance support: Bilateral upper extremity supported Standing balance-Leahy Scale: Poor Standing balance comment: leaning onto sink with BUE support and minG                            Cognition Arousal/Alertness: Awake/alert Behavior During Therapy: WFL for tasks assessed/performed Overall Cognitive Status: History of cognitive impairments - at baseline                                 General Comments: pt with advanced dementia, does follow commands with both verbal and tactile cues, requires tactile cuing and initiation of most mobility      Exercises      General Comments General comments (skin integrity, edema, etc.): pt on RA, HR in 90s at  rest. Pt does experience possible syncopal episode when attempting to return to bed, becoming less responsive verbally with eyes closed and less effort to remain standing. PT assisted pt into a seated position and then assisted with transfer to bed. Pt BP in supine 97588 (90), HR 77-83. Pt becomes much more verbally responsive upon return to supine.       Pertinent Vitals/Pain Pain Assessment: No/denies pain    Home Living                      Prior Function            PT Goals (current goals can now be found in the care plan section) Acute Rehab PT Goals Patient Stated Goal: family goal to improve mobility and reduce falls risk Progress towards PT goals: Progressing toward goals    Frequency    Min 3X/week (family declining SNF placement)      PT Plan Current plan remains appropriate    Co-evaluation              AM-PAC PT "6 Clicks" Mobility   Outcome Measure  Help needed turning from your back to your side while in a flat bed without using bedrails?: A Lot Help needed moving from lying on your back to sitting on the side of a flat bed without using bedrails?: A Lot Help needed moving to and from a bed to a chair (including a wheelchair)?: A Lot Help needed standing up from a chair using your arms (e.g., wheelchair or bedside chair)?: A Lot Help needed to walk in hospital room?: A Lot Help needed climbing 3-5 steps with a railing? : Total 6 Click Score: 11    End of Session Equipment Utilized During Treatment: Gait belt Activity Tolerance: Treatment limited secondary to medical complications (Comment) (possible syncopal episode, change in mental status) Patient left: in bed;with call bell/phone within reach;with bed alarm set;with family/visitor present Nurse Communication: Mobility status PT Visit Diagnosis: Unsteadiness on feet (R26.81);Other abnormalities of gait and mobility (R26.89);Muscle weakness (generalized) (M62.81)     Time: 3254-9826 PT Time Calculation (min) (ACUTE ONLY): 30 min  Charges:  $Gait Training: 8-22 mins $Therapeutic Activity: 8-22 mins                     Arlyss Gandy, PT, DPT Acute Rehabilitation Pager: [phone]    Arlyss Gandy 10/15/2020, 5:07 PM

## 2020-10-15 NOTE — Progress Notes (Signed)
Foley removed per MD order. Patient tolerated well. Starting voiding trial and spontaneous bladder scans when needed.

## 2020-10-15 NOTE — Plan of Care (Signed)
  Problem: Health Behavior/Discharge Planning: Goal: Ability to manage health-related needs will improve Outcome: Progressing   Problem: Clinical Measurements: Goal: Ability to maintain clinical measurements within normal limits will improve Outcome: Progressing   Problem: Safety: Goal: Ability to remain free from injury will improve Outcome: Progressing   

## 2020-10-15 NOTE — Progress Notes (Signed)
PROGRESS NOTE    Tonishia Steffy  KTG:[id number] DOB: Mar 12, 1933 DOA: 10/10/2020 PCP: [name], NP     Brief Narrative:  [name] [name] is an 87 year old female with past medical history significant for advanced dementia, CAD status post CABG, resolved ischemic cardiomyopathy, hypertension who presented to the hospital with shortness of breath.  In the emergency department, patient was found to be febrile with wheezes.  Chest x-ray did not show infiltrates.  Patient was started on empiric antibiotics for possible pneumonia/bronchitis.  Also given 1 dose of Lasix for possible CHF exacerbation.  During hospitalization, patient was found to have acute urinary retention with AKI secondary to postobstructive etiology.  Nephrology was consulted.  Patient had Foley catheter placed, kidney function improved.  New events last 24 hours / Subjective: Patient seen with daughter at bedside.  Patient remains calm, she is oriented to self only at baseline.  Daughter states that patient today does not seem as talkative as her normal but is improved.  Coughing less as well.  At baseline, daughter states that patient is able to walk and feed herself, but does not recognize her daughter, is oriented to self only.  Assessment & Plan:   Principal Problem:   Acute bronchitis Active Problems:   S/P CABG x 3   Dementia with behavioral disturbance (HCC)   Acute CHF (congestive heart failure) (HCC)   Acute bronchitis -Steroids discontinued due to worsening confusion -Last antibiotics today  Advanced dementia with behavioral disturbance -Improved and likely at her baseline now.  She may have waxing and waning delirium due to hospitalization  AKI -Due to postobstructive etiology -Nephrology signed off -Resolved with Foley catheter placement  Acute urinary retention -Remove Foley catheter today for spontaneous voiding trial  Hypothyroidism -Continue Synthroid  Hypertension -Continue  amlodipine  CAD status post CABG  Fever -Check UA, blood culture   DVT prophylaxis:  enoxaparin (LOVENOX) injection 40 mg Start: 10/15/20 2200  Code Status:     Code Status Orders  (From admission, onward)           Start     Ordered   10/10/20 2229  Do not attempt resuscitation (DNR)  Continuous       Question Answer Comment  In the event of cardiac or respiratory ARREST Do not call a "code blue"   In the event of cardiac or respiratory ARREST Do not perform Intubation, CPR, defibrillation or ACLS   In the event of cardiac or respiratory ARREST Use medication by any route, position, wound care, and other measures to relive pain and suffering. May use oxygen, suction and manual treatment of airway obstruction as needed for comfort.      10/10/20 2229           Code Status History     Date Active Date Inactive Code Status Order ID Comments User Context   09/18/2020 1601 10/10/2020 1546 DNR [id number]  [name], NP Outpatient   02/21/2020 1539 02/27/2020 2000 Full Code [id number]  Samson Frederic, MD Inpatient   02/21/2020 1539 02/21/2020 1539 DNR [id number]  Teddy Spike, DO Inpatient   02/15/2014 1234 02/19/2014 1406 Full Code [id number]  Rowe Clack, PA-C Inpatient   02/11/2014 1901 02/15/2014 1234 Full Code [id number]  [place], Peter M, MD Inpatient   02/10/2014 2358 02/11/2014 1901 Full Code [id number]  Glori Luis, MD Inpatient      Family Communication: Daughter at bedside Disposition Plan:  Status is: Inpatient  Remains inpatient appropriate because:Inpatient level of care  appropriate due to severity of illness  Dispo: The patient is from: Home              Anticipated d/c is to: Home              Patient currently is not medically stable to d/c.   Difficult to place patient No     Antimicrobials:  Anti-infectives (From admission, onward)    Start     Dose/Rate Route Frequency Ordered Stop   10/14/20 0100  azithromycin (ZITHROMAX) 250  mg in dextrose 5 % 125 mL IVPB  Status:  Discontinued        250 mg 125 mL/hr over 60 Minutes Intravenous Every 24 hours 10/14/20 0008 10/15/20 1337   10/13/20 1215  cefTRIAXone (ROCEPHIN) 1 g in sodium chloride 0.9 % 100 mL IVPB  Status:  Discontinued        1 g 200 mL/hr over 30 Minutes Intravenous Every 24 hours 10/13/20 1125 10/15/20 1337   10/12/20 2200  vancomycin (VANCOREADY) IVPB 1000 mg/200 mL  Status:  Discontinued        1,000 mg 200 mL/hr over 60 Minutes Intravenous Every 48 hours 10/10/20 2103 10/10/20 2229   10/12/20 2200  azithromycin (ZITHROMAX) tablet 500 mg  Status:  Discontinued        500 mg Oral Daily 10/12/20 1214 10/14/20 0008   10/11/20 0800  ceFEPIme (MAXIPIME) 2 g in sodium chloride 0.9 % 100 mL IVPB  Status:  Discontinued        2 g 200 mL/hr over 30 Minutes Intravenous Every 12 hours 10/10/20 2103 10/10/20 2229   10/11/20 0800  cefTRIAXone (ROCEPHIN) 2 g in sodium chloride 0.9 % 100 mL IVPB  Status:  Discontinued        2 g 200 mL/hr over 30 Minutes Intravenous Every 24 hours 10/10/20 2229 10/11/20 0908   10/10/20 2230  azithromycin (ZITHROMAX) 500 mg in sodium chloride 0.9 % 250 mL IVPB  Status:  Discontinued        500 mg 250 mL/hr over 60 Minutes Intravenous Every 24 hours 10/10/20 2229 10/12/20 1214   10/10/20 2015  vancomycin (VANCOREADY) IVPB 1250 mg/250 mL        1,250 mg 166.7 mL/hr over 90 Minutes Intravenous  Once 10/10/20 1944 10/10/20 2303   10/10/20 1945  ceFEPIme (MAXIPIME) 2 g in sodium chloride 0.9 % 100 mL IVPB        2 g 200 mL/hr over 30 Minutes Intravenous  Once 10/10/20 1944 10/10/20 2106        Objective: Vitals:   10/15/20 0750 10/15/20 0825 10/15/20 1144 10/15/20 1159  BP:  126/60 (!) 184/94 (!) 143/71  Pulse:  80 76 76  Resp:  20 20   Temp:  (!) 100.6 F (38.1 C) 98.3 F (36.8 C)   TempSrc:  Oral Oral   SpO2: 94%  91%   Weight:      Height:        Intake/Output Summary (Last 24 hours) at 10/15/2020 1406 Last data filed  at 10/15/2020 0832 Gross per 24 hour  Intake 1299.44 ml  Output 2600 ml  Net -1300.56 ml   Filed Weights   10/13/20 0546 10/14/20 0339 10/15/20 0054  Weight: 69.8 kg 68.5 kg 67.5 kg    Examination:  General exam: Appears calm and comfortable  Respiratory system: Clear to auscultation. Respiratory effort normal. No respiratory distress.  Cardiovascular system: S1 & S2 heard, RRR. No murmurs. No pedal edema. Gastrointestinal  system: Abdomen is nondistended, soft and nontender. Normal bowel sounds heard. Central nervous system: Alert and oriented to self Extremities: Symmetric in appearance  Skin: No rashes, lesions or ulcers on exposed skin  Psychiatry: Advanced dementia  Data Reviewed: I have personally reviewed following labs and imaging studies  CBC: Recent Labs  Lab 10/10/20 1845 10/11/20 0026 10/13/20 0233  WBC 5.1 5.0 12.1*  NEUTROABS 4.0  --   --   HGB 11.8* 13.2 12.4  HCT 36.0 39.3 37.3  MCV 86.3 84.9 85.7  PLT 198 192 233   Basic Metabolic Panel: Recent Labs  Lab 10/10/20 1845 10/11/20 0026 10/12/20 0951 10/13/20 0141 10/14/20 0323  NA 127* 132* 134* 133* 139  K 3.7 3.1* 4.1 4.1 3.7  CL 94* 98 102 102 108  CO2 22 23 20* 21* 22  GLUCOSE 98 112* 142* 137* 108*  BUN 10 11 30* 42* 22  CREATININE 0.85 0.98 1.86* 2.64* 0.96  CALCIUM 9.2 9.2 9.5 9.4 9.0   GFR: Estimated Creatinine Clearance: 35.1 mL/min (by C-G formula based on SCr of 0.96 mg/dL). Liver Function Tests: Recent Labs  Lab 10/10/20 1845 10/13/20 0141  AST 26 28  ALT 14 15  ALKPHOS 41 38  BILITOT 0.5 0.4  PROT 7.6 7.0  ALBUMIN 3.8 3.4*   No results for input(s): LIPASE, AMYLASE in the last 168 hours. No results for input(s): AMMONIA in the last 168 hours. Coagulation Profile: Recent Labs  Lab 10/10/20 1845  INR 1.1   Cardiac Enzymes: No results for input(s): CKTOTAL, CKMB, CKMBINDEX, TROPONINI in the last 168 hours. BNP (last 3 results) No results for input(s): PROBNP in the  last 8760 hours. HbA1C: No results for input(s): HGBA1C in the last 72 hours. CBG: Recent Labs  Lab 10/14/20 1115 10/14/20 1558 10/14/20 2342 10/15/20 0513 10/15/20 1141  GLUCAP 104* 106* 88 89 103*   Lipid Profile: No results for input(s): CHOL, HDL, LDLCALC, TRIG, CHOLHDL, LDLDIRECT in the last 72 hours. Thyroid Function Tests: No results for input(s): TSH, T4TOTAL, FREET4, T3FREE, THYROIDAB in the last 72 hours. Anemia Panel: No results for input(s): VITAMINB12, FOLATE, FERRITIN, TIBC, IRON, RETICCTPCT in the last 72 hours. Sepsis Labs: Recent Labs  Lab 10/10/20 1644 10/10/20 1921  LATICACIDVEN 1.1 1.0    Recent Results (from the past 240 hour(s))  Resp Panel by RT-PCR (Flu A&B, Covid) Nasopharyngeal Swab     Status: None   Collection Time: 10/10/20  4:24 PM   Specimen: Nasopharyngeal Swab; Nasopharyngeal(NP) swabs in vial transport medium  Result Value Ref Range Status   SARS Coronavirus 2 by RT PCR NEGATIVE NEGATIVE Final    Comment: (NOTE) SARS-CoV-2 target nucleic acids are NOT DETECTED.  The SARS-CoV-2 RNA is generally detectable in upper respiratory specimens during the acute phase of infection. The lowest concentration of SARS-CoV-2 viral copies this assay can detect is 138 copies/mL. A negative result does not preclude SARS-Cov-2 infection and should not be used as the sole basis for treatment or other patient management decisions. A negative result may occur with  improper specimen collection/handling, submission of specimen other than nasopharyngeal swab, presence of viral mutation(s) within the areas targeted by this assay, and inadequate number of viral copies(<138 copies/mL). A negative result must be combined with clinical observations, patient history, and epidemiological information. The expected result is Negative.  Fact Sheet for Patients:  BloggerCourse.com  Fact Sheet for Healthcare Providers:   SeriousBroker.it  This test is no t yet approved or cleared by the [place]  States FDA and  has been authorized for detection and/or diagnosis of SARS-CoV-2 by FDA under an Emergency Use Authorization (EUA). This EUA will remain  in effect (meaning this test can be used) for the duration of the COVID-19 declaration under Section 564(b)(1) of the Act, 21 U.S.C.section 360bbb-3(b)(1), unless the authorization is terminated  or revoked sooner.       Influenza A by PCR NEGATIVE NEGATIVE Final   Influenza B by PCR NEGATIVE NEGATIVE Final    Comment: (NOTE) The Xpert Xpress SARS-CoV-2/FLU/RSV plus assay is intended as an aid in the diagnosis of influenza from Nasopharyngeal swab specimens and should not be used as a sole basis for treatment. Nasal washings and aspirates are unacceptable for Xpert Xpress SARS-CoV-2/FLU/RSV testing.  Fact Sheet for Patients: BloggerCourse.com  Fact Sheet for Healthcare Providers: SeriousBroker.it  This test is not yet approved or cleared by the [place] FDA and has been authorized for detection and/or diagnosis of SARS-CoV-2 by FDA under an Emergency Use Authorization (EUA). This EUA will remain in effect (meaning this test can be used) for the duration of the COVID-19 declaration under Section 564(b)(1) of the Act, 21 U.S.C. section 360bbb-3(b)(1), unless the authorization is terminated or revoked.  Performed at [facility], [address] [place], [place] [address]   Blood Culture (routine x 2)     Status: None   Collection Time: 10/10/20  4:44 PM   Specimen: BLOOD  Result Value Ref Range Status   Specimen Description BLOOD LEFT ANTECUBITAL  Final   Special Requests   Final    BOTTLES DRAWN AEROBIC AND ANAEROBIC Blood Culture results may not be optimal due to an inadequate volume of blood received in culture bottles   Culture   Final    NO GROWTH 5  DAYS Performed at [facility], [address] [place], [place] [address]    Report Status 10/15/2020 FINAL  Final  Blood Culture (routine x 2)     Status: None   Collection Time: 10/10/20  4:44 PM   Specimen: BLOOD RIGHT HAND  Result Value Ref Range Status   Specimen Description BLOOD RIGHT HAND  Final   Special Requests   Final    BOTTLES DRAWN AEROBIC AND ANAEROBIC Blood Culture adequate volume   Culture   Final    NO GROWTH 5 DAYS Performed at [facility], [address], [place], [place] [address]    Report Status 10/15/2020 FINAL  Final  Urine culture     Status: Abnormal   Collection Time: 10/10/20  6:52 PM   Specimen: In/Out Cath Urine  Result Value Ref Range Status   Specimen Description IN/OUT CATH URINE  Final   Special Requests   Final    NONE Performed at [facility], [address] [place], [place] [address]    Culture MULTIPLE SPECIES PRESENT, SUGGEST RECOLLECTION (A)  Final   Report Status 10/12/2020 FINAL  Final  Culture, Urine     Status: None   Collection Time: 10/13/20 12:17 PM   Specimen: Urine, Clean Catch  Result Value Ref Range Status   Specimen Description URINE, CLEAN CATCH  Final   Special Requests NONE  Final   Culture   Final    NO GROWTH Performed at [facility], [address], [place], [place] [address]    Report Status 10/14/2020 FINAL  Final      Radiology Studies: US RENAL  Result Date: 10/13/2020 CLINICAL  DATA:  Acute kidney injury EXAM: RENAL / URINARY TRACT ULTRASOUND COMPLETE COMPARISON:  None. FINDINGS: Technical note: Examination technically limited by patient condition and inability to breath hold. Right Kidney: Renal measurements: 11.4 x 4.4 x 6.0 cm = volume: 157 mL. Echogenicity within normal limits. Mild hydronephrosis. No mass or shadowing stone visualized. Left Kidney: Renal measurements: 10.1 x 4.8 x 5.2 cm = volume: 133 mL. Echogenicity within normal limits. 13 mm echogenic, shadowing  calcification the upper pole of the left kidney. No mass or hydronephrosis visualized. Bladder: Decompressed by Foley catheter. Other: None. IMPRESSION: 1. Limited exam. 2. Mild right hydronephrosis. 3. Probable nonobstructing 1.3 cm left upper calculus. Electronically Signed   By: Duanne Guess D.O.   On: 10/13/2020 18:13      Scheduled Meds:  amLODipine  10 mg Oral Daily   Chlorhexidine Gluconate Cloth  6 each Topical Daily   enoxaparin (LOVENOX) injection  40 mg Subcutaneous QHS   ezetimibe  10 mg Oral Daily   ipratropium-albuterol  3 mL Nebulization BID   latanoprost  1 drop Left Eye QHS   levothyroxine  100 mcg Oral QAC breakfast   Continuous Infusions:  sodium chloride 50 mL/hr at 10/15/20 0448     LOS: 2 days      Time spent: 25 minutes   Noralee Stain, DO [facility] 10/15/2020, 2:06 PM   Available via Epic secure chat 7am-7pm After these hours, please refer to coverage provider listed on [email or web address]

## 2020-10-16 LAB — BASIC METABOLIC PANEL
Anion gap: 11 (ref 5–15)
BUN: 10 mg/dL (ref 8–23)
CO2: 26 mmol/L (ref 22–32)
Calcium: 9 mg/dL (ref 8.9–10.3)
Chloride: 106 mmol/L (ref 98–111)
Creatinine, Ser: 0.71 mg/dL (ref 0.44–1.00)
GFR, Estimated: >60 mL/min (ref >60)
Glucose, Bld: 104 mg/dL — ABNORMAL HIGH (ref 70–99)
Potassium: 3 mmol/L — ABNORMAL LOW (ref 3.5–5.1)
Sodium: 143 mmol/L (ref 135–145)

## 2020-10-16 LAB — CBC
HCT: 34 % — ABNORMAL LOW (ref 36.0–46.0)
Hemoglobin: 11.4 g/dL — ABNORMAL LOW (ref 12.0–15.0)
MCH: 28.3 pg (ref 26.0–34.0)
MCHC: 33.5 g/dL (ref 30.0–36.0)
MCV: 84.4 fL (ref 80.0–100.0)
Platelets: 247 10*3/uL (ref 150–400)
RBC: 4.03 MIL/uL (ref 3.87–5.11)
RDW: 15.2 % (ref 11.5–15.5)
WBC: 7.9 10*3/uL (ref 4.0–10.5)
nRBC: 0 % (ref 0.0–0.2)

## 2020-10-16 MED ORDER — POTASSIUM CHLORIDE CRYS ER 20 MEQ PO TBCR
40.0000 meq | EXTENDED_RELEASE_TABLET | ORAL | Status: AC
  Administered 2020-10-16 (×2): 40 meq via ORAL
  Filled 2020-10-16 (×2): qty 2

## 2020-10-16 MED ORDER — ENSURE ENLIVE PO LIQD: 237.0000 mL | Freq: Two times a day (BID) | ORAL | Status: DC

## 2020-10-16 MED ORDER — ENSURE ENLIVE PO LIQD
237.0000 mL | Freq: Two times a day (BID) | ORAL | Status: DC
  Administered 2020-10-16 – 2020-10-17 (×2): 237 mL via ORAL

## 2020-10-16 NOTE — Care Management Important Message (Signed)
Important Message  Patient Details  Name: [name] [name] MRN: [id number] Date of Birth: 1933/01/27   Medicare Important Message Given:  Yes     Yoshi Vicencio Stefan Church 10/16/2020, 3:46 PM

## 2020-10-16 NOTE — Progress Notes (Signed)
PROGRESS NOTE    Ayako Tapanes  ZOX:[id number] DOB: June 15, 1981 DOA: 10/10/2020 PCP: [name], NP     Brief Narrative:  [name] [name] is an 87 year old female with past medical history significant for advanced dementia, CAD status post CABG, resolved ischemic cardiomyopathy, hypertension who presented to the hospital with shortness of breath.  In the emergency department, patient was found to be febrile with wheezes.  Chest x-ray did not show infiltrates.  Patient was started on empiric antibiotics for possible pneumonia/bronchitis.  Also given 1 dose of Lasix for possible CHF exacerbation.  During hospitalization, patient was found to have acute urinary retention with AKI secondary to postobstructive etiology.  Nephrology was consulted.  Patient had Foley catheter placed, kidney function improved.  New events last 24 hours / Subjective: Patient's daughter is at bedside.  Foley catheter was removed yesterday, looks like there is a documentation of straight cath done overnight with 600 mL urine output.  Patient remains at her baseline severe dementia.  Daughter states that patient's hospital bed should be delivered home tomorrow.  Assessment & Plan:   Principal Problem:   Acute bronchitis Active Problems:   S/P CABG x 3   Dementia with behavioral disturbance (HCC)   Acute CHF (congestive heart failure) (HCC)   Acute bronchitis -Steroids discontinued due to worsening confusion -Completed antibiotic treatment  Advanced dementia with behavioral disturbance -Improved and likely at her baseline now.  She may have waxing and waning delirium due to hospitalization  AKI -Due to postobstructive etiology -Nephrology signed off -Resolved with Foley catheter placement  Acute urinary retention -Remove Foley catheter 6/15.  Continue voiding trial.  Discussed this with daughter, daughter does not feel that patient will keep her Foley catheter in place at home due to her severe dementia.   She is willing to learn to do a straight cath at home if patient continues to have urinary retention.  Hypothyroidism -Continue Synthroid  Hypertension -Continue amlodipine  CAD status post CABG  Fever -Check UA, blood culture -Afebrile last 24 hours  Hypokalemia -Replace, trend   DVT prophylaxis:  enoxaparin (LOVENOX) injection 40 mg Start: 10/15/20 2200  Code Status:     Code Status Orders  (From admission, onward)           Start     Ordered   10/10/20 2229  Do not attempt resuscitation (DNR)  Continuous       Question Answer Comment  In the event of cardiac or respiratory ARREST Do not call a "code blue"   In the event of cardiac or respiratory ARREST Do not perform Intubation, CPR, defibrillation or ACLS   In the event of cardiac or respiratory ARREST Use medication by any route, position, wound care, and other measures to relive pain and suffering. May use oxygen, suction and manual treatment of airway obstruction as needed for comfort.      10/10/20 2229           Code Status History     Date Active Date Inactive Code Status Order ID Comments User Context   09/18/2020 1601 10/10/2020 1546 DNR [id number]  [name], NP Outpatient   02/21/2020 1539 02/27/2020 2000 Full Code [id number]  Samson Frederic, MD Inpatient   02/21/2020 1539 02/21/2020 1539 DNR [id number]  Teddy Spike, DO Inpatient   02/15/2014 1234 02/19/2014 1406 Full Code [id number]  Rowe Clack, PA-C Inpatient   02/11/2014 1901 02/15/2014 1234 Full Code [id number]  [place], Peter M, MD Inpatient   02/10/2014  2358 02/11/2014 1901 Full Code [id number]  Glori LuisFriedman, Daniel, MD Inpatient      Family Communication: Daughter at bedside Disposition Plan:  Status is: Inpatient  Remains inpatient appropriate because:Inpatient level of care appropriate due to severity of illness  Dispo: The patient is from: Home              Anticipated d/c is to: Home              Patient currently is not  medically stable to d/c.   Difficult to place patient No     Antimicrobials:  Anti-infectives (From admission, onward)    Start     Dose/Rate Route Frequency Ordered Stop   10/14/20 0100  azithromycin (ZITHROMAX) 250 mg in dextrose 5 % 125 mL IVPB  Status:  Discontinued        250 mg 125 mL/hr over 60 Minutes Intravenous Every 24 hours 10/14/20 0008 10/15/20 1337   10/13/20 1215  cefTRIAXone (ROCEPHIN) 1 g in sodium chloride 0.9 % 100 mL IVPB  Status:  Discontinued        1 g 200 mL/hr over 30 Minutes Intravenous Every 24 hours 10/13/20 1125 10/15/20 1337   10/12/20 2200  vancomycin (VANCOREADY) IVPB 1000 mg/200 mL  Status:  Discontinued        1,000 mg 200 mL/hr over 60 Minutes Intravenous Every 48 hours 10/10/20 2103 10/10/20 2229   10/12/20 2200  azithromycin (ZITHROMAX) tablet 500 mg  Status:  Discontinued        500 mg Oral Daily 10/12/20 1214 10/14/20 0008   10/11/20 0800  ceFEPIme (MAXIPIME) 2 g in sodium chloride 0.9 % 100 mL IVPB  Status:  Discontinued        2 g 200 mL/hr over 30 Minutes Intravenous Every 12 hours 10/10/20 2103 10/10/20 2229   10/11/20 0800  cefTRIAXone (ROCEPHIN) 2 g in sodium chloride 0.9 % 100 mL IVPB  Status:  Discontinued        2 g 200 mL/hr over 30 Minutes Intravenous Every 24 hours 10/10/20 2229 10/11/20 0908   10/10/20 2230  azithromycin (ZITHROMAX) 500 mg in sodium chloride 0.9 % 250 mL IVPB  Status:  Discontinued        500 mg 250 mL/hr over 60 Minutes Intravenous Every 24 hours 10/10/20 2229 10/12/20 1214   10/10/20 2015  vancomycin (VANCOREADY) IVPB 1250 mg/250 mL        1,250 mg 166.7 mL/hr over 90 Minutes Intravenous  Once 10/10/20 1944 10/10/20 2303   10/10/20 1945  ceFEPIme (MAXIPIME) 2 g in sodium chloride 0.9 % 100 mL IVPB        2 g 200 mL/hr over 30 Minutes Intravenous  Once 10/10/20 1944 10/10/20 2106        Objective: Vitals:   10/15/20 1942 10/16/20 0358 10/16/20 0727 10/16/20 1131  BP: 121/66 130/76  (!) 150/82  Pulse:  67 74  74  Resp: 16 20  16   Temp: 98.7 F (37.1 C) 98.6 F (37 C)  98.6 F (37 C)  TempSrc: Oral Oral  Oral  SpO2: 90% 96% 98% 94%  Weight:  65.8 kg    Height:        Intake/Output Summary (Last 24 hours) at 10/16/2020 1228 Last data filed at 10/16/2020 0341 Gross per 24 hour  Intake 240 ml  Output 1850 ml  Net -1610 ml    Filed Weights   10/14/20 0339 10/15/20 0054 10/16/20 0358  Weight: 68.5 kg 67.5 kg 65.8  kg   Examination: General exam: Appears calm and comfortable  Respiratory system: Clear to auscultation. Respiratory effort normal. Cardiovascular system: S1 & S2 heard, RRR. No pedal edema. Gastrointestinal system: Abdomen is nondistended, soft and nontender. Normal bowel sounds heard. Central nervous system: Alert  Extremities: Symmetric in appearance bilaterally  Skin: No rashes, lesions or ulcers on exposed skin  Psychiatry: Advanced dementia  Data Reviewed: I have personally reviewed following labs and imaging studies  CBC: Recent Labs  Lab 10/10/20 1845 10/11/20 0026 10/13/20 0233 10/16/20 0316  WBC 5.1 5.0 12.1* 7.9  NEUTROABS 4.0  --   --   --   HGB 11.8* 13.2 12.4 11.4*  HCT 36.0 39.3 37.3 34.0*  MCV 86.3 84.9 85.7 84.4  PLT 198 192 233 247    Basic Metabolic Panel: Recent Labs  Lab 10/11/20 0026 10/12/20 0951 10/13/20 0141 10/14/20 0323 10/16/20 0316  NA 132* 134* 133* 139 143  K 3.1* 4.1 4.1 3.7 3.0*  CL 98 102 102 108 106  CO2 23 20* 21* 22 26  GLUCOSE 112* 142* 137* 108* 104*  BUN 11 30* 42* 22 10  CREATININE 0.98 1.86* 2.64* 0.96 0.71  CALCIUM 9.2 9.5 9.4 9.0 9.0    GFR: Estimated Creatinine Clearance: 41.6 mL/min (by C-G formula based on SCr of 0.71 mg/dL). Liver Function Tests: Recent Labs  Lab 10/10/20 1845 10/13/20 0141  AST 26 28  ALT 14 15  ALKPHOS 41 38  BILITOT 0.5 0.4  PROT 7.6 7.0  ALBUMIN 3.8 3.4*    No results for input(s): LIPASE, AMYLASE in the last 168 hours. No results for input(s): AMMONIA in the  last 168 hours. Coagulation Profile: Recent Labs  Lab 10/10/20 1845  INR 1.1    Cardiac Enzymes: No results for input(s): CKTOTAL, CKMB, CKMBINDEX, TROPONINI in the last 168 hours. BNP (last 3 results) No results for input(s): PROBNP in the last 8760 hours. HbA1C: No results for input(s): HGBA1C in the last 72 hours. CBG: Recent Labs  Lab 10/14/20 1558 10/14/20 2342 10/15/20 0513 10/15/20 1141 10/15/20 2118  GLUCAP 106* 88 89 103* 159*    Lipid Profile: No results for input(s): CHOL, HDL, LDLCALC, TRIG, CHOLHDL, LDLDIRECT in the last 72 hours. Thyroid Function Tests: No results for input(s): TSH, T4TOTAL, FREET4, T3FREE, THYROIDAB in the last 72 hours. Anemia Panel: No results for input(s): VITAMINB12, FOLATE, FERRITIN, TIBC, IRON, RETICCTPCT in the last 72 hours. Sepsis Labs: Recent Labs  Lab 10/10/20 1644 10/10/20 1921  LATICACIDVEN 1.1 1.0     Recent Results (from the past 240 hour(s))  Resp Panel by RT-PCR (Flu A&B, Covid) Nasopharyngeal Swab     Status: None   Collection Time: 10/10/20  4:24 PM   Specimen: Nasopharyngeal Swab; Nasopharyngeal(NP) swabs in vial transport medium  Result Value Ref Range Status   SARS Coronavirus 2 by RT PCR NEGATIVE NEGATIVE Final    Comment: (NOTE) SARS-CoV-2 target nucleic acids are NOT DETECTED.  The SARS-CoV-2 RNA is generally detectable in upper respiratory specimens during the acute phase of infection. The lowest concentration of SARS-CoV-2 viral copies this assay can detect is 138 copies/mL. A negative result does not preclude SARS-Cov-2 infection and should not be used as the sole basis for treatment or other patient management decisions. A negative result may occur with  improper specimen collection/handling, submission of specimen other than nasopharyngeal swab, presence of viral mutation(s) within the areas targeted by this assay, and inadequate number of viral copies(<138 copies/mL). A negative result must  be  combined with clinical observations, patient history, and epidemiological information. The expected result is Negative.  Fact Sheet for Patients:  BloggerCourse.com  Fact Sheet for Healthcare Providers:  SeriousBroker.it  This test is no t yet approved or cleared by the [place] FDA and  has been authorized for detection and/or diagnosis of SARS-CoV-2 by FDA under an Emergency Use Authorization (EUA). This EUA will remain  in effect (meaning this test can be used) for the duration of the COVID-19 declaration under Section 564(b)(1) of the Act, 21 U.S.C.section 360bbb-3(b)(1), unless the authorization is terminated  or revoked sooner.       Influenza A by PCR NEGATIVE NEGATIVE Final   Influenza B by PCR NEGATIVE NEGATIVE Final    Comment: (NOTE) The Xpert Xpress SARS-CoV-2/FLU/RSV plus assay is intended as an aid in the diagnosis of influenza from Nasopharyngeal swab specimens and should not be used as a sole basis for treatment. Nasal washings and aspirates are unacceptable for Xpert Xpress SARS-CoV-2/FLU/RSV testing.  Fact Sheet for Patients: BloggerCourse.com  Fact Sheet for Healthcare Providers: SeriousBroker.it  This test is not yet approved or cleared by the [place] FDA and has been authorized for detection and/or diagnosis of SARS-CoV-2 by FDA under an Emergency Use Authorization (EUA). This EUA will remain in effect (meaning this test can be used) for the duration of the COVID-19 declaration under Section 564(b)(1) of the Act, 21 U.S.C. section 360bbb-3(b)(1), unless the authorization is terminated or revoked.  Performed at [facility], [address], [place], [place] [address]   Blood Culture (routine x 2)     Status: None   Collection Time: 10/10/20  4:44 PM   Specimen: BLOOD  Result Value Ref Range Status   Specimen Description BLOOD LEFT  ANTECUBITAL  Final   Special Requests   Final    BOTTLES DRAWN AEROBIC AND ANAEROBIC Blood Culture results may not be optimal due to an inadequate volume of blood received in culture bottles   Culture   Final    NO GROWTH 5 DAYS Performed at [facility], [address], [place], [place] [address]    Report Status 10/15/2020 FINAL  Final  Blood Culture (routine x 2)     Status: None   Collection Time: 10/10/20  4:44 PM   Specimen: BLOOD RIGHT HAND  Result Value Ref Range Status   Specimen Description BLOOD RIGHT HAND  Final   Special Requests   Final    BOTTLES DRAWN AEROBIC AND ANAEROBIC Blood Culture adequate volume   Culture   Final    NO GROWTH 5 DAYS Performed at [facility], [address], [place], [place] [address]    Report Status 10/15/2020 FINAL  Final  Urine culture     Status: Abnormal   Collection Time: 10/10/20  6:52 PM   Specimen: In/Out Cath Urine  Result Value Ref Range Status   Specimen Description IN/OUT CATH URINE  Final   Special Requests   Final    NONE Performed at [facility], [address] [place], [place] [address]    Culture MULTIPLE SPECIES PRESENT, SUGGEST RECOLLECTION (A)  Final   Report Status 10/12/2020 FINAL  Final  Culture, Urine     Status: None   Collection Time: 10/13/20 12:17 PM   Specimen: Urine, Clean Catch  Result Value Ref Range Status   Specimen Description URINE, CLEAN CATCH  Final   Special Requests NONE  Final   Culture  Final    NO GROWTH Performed at [facility], [address] [place], [place] [address]    Report Status 10/14/2020 FINAL  Final  Culture, blood (routine x 2)     Status: None (Preliminary result)   Collection Time: 10/15/20  2:45 PM   Specimen: BLOOD RIGHT HAND  Result Value Ref Range Status   Specimen Description BLOOD RIGHT HAND  Final   Special Requests   Final    BOTTLES DRAWN AEROBIC AND ANAEROBIC Blood Culture adequate volume   Culture   Final    NO GROWTH < 24  HOURS Performed at [facility], [address] [place], [place] [address]    Report Status PENDING  Incomplete  Culture, blood (routine x 2)     Status: None (Preliminary result)   Collection Time: 10/15/20  2:53 PM   Specimen: BLOOD LEFT HAND  Result Value Ref Range Status   Specimen Description BLOOD LEFT HAND  Final   Special Requests   Final    BOTTLES DRAWN AEROBIC AND ANAEROBIC Blood Culture results may not be optimal due to an excessive volume of blood received in culture bottles   Culture   Final    NO GROWTH < 24 HOURS Performed at [facility], [address], [place], [place] [address]    Report Status PENDING  Incomplete       Radiology Studies: No results found.    Scheduled Meds:  amLODipine  10 mg Oral Daily   Chlorhexidine Gluconate Cloth  6 each Topical Daily   enoxaparin (LOVENOX) injection  40 mg Subcutaneous QHS   ezetimibe  10 mg Oral Daily   ipratropium-albuterol  3 mL Nebulization BID   latanoprost  1 drop Left Eye QHS   levothyroxine  100 mcg Oral QAC breakfast   potassium chloride  40 mEq Oral Q4H   Continuous Infusions:     LOS: 3 days      Time spent: 20  minutes   Noralee Stain, DO [facility] 10/16/2020, 12:28 PM   Available via Epic secure chat 7am-7pm After these hours, please refer to coverage provider listed on [email or web address]

## 2020-10-16 NOTE — Progress Notes (Signed)
Occupational Therapy Treatment Patient Details Name: [name] [name] MRN: [id number] DOB: 1933/01/12 Today's Date: 10/16/2020    History of present illness 87 y.o. female presents to [facility] ED on 10/10/2020 with reports of SOB and cough. Pt found to be febrila in ED. PMH includes advanced dementia, CAD s/p CABG, resolved ischemic cardiomyopathy after CABG, hypertension.   OT comments  Pt making incremental progress with OT goals. This session, pt demonstrated difficulty following simple commands or initiating movement. Pt's daughter is very helpful during sessions with encouraging pt and assisting with mobility needs. Pt attempted to stand x5 this session from elevated surface, requiring max A +2, after 3 attempts, pt began to resist and push back with strength. Pt requires Max A to eat and to complete grooming while sitting, most likely due to cognitive deficits from Advanced dementia. Acute OT will continue to follow to assist pt/family with progressing functional mobility and decreasing caregiver burden.    Follow Up Recommendations  Home health OT;Supervision/Assistance - 24 hour    Equipment Recommendations  Hospital bed;Wheelchair (measurements OT);Wheelchair cushion (measurements OT)    Recommendations for Other Services      Precautions / Restrictions Precautions Precautions: Fall Precaution Comments: advanced dementia, monitor BP Restrictions Weight Bearing Restrictions: No       Mobility Bed Mobility Overal bed mobility: Needs Assistance Bed Mobility: Supine to Sit;Sit to Supine     Supine to sit: Max assist;HOB elevated Sit to supine: Total assist;+2 for physical assistance;+2 for safety/equipment   General bed mobility comments: Pt requiring max assist tosit EOB due to difficulty following simple commands and initiating movement.    Transfers Overall transfer level: Needs assistance Equipment used: Rolling walker (2 wheeled);2 person hand held assist Transfers: Sit  to/from Stand Sit to Stand: Max assist;+2 safety/equipment;+2 physical assistance         General transfer comment: Pt requiring verbal and tactile cues to initiate movements and follow simple commands. Pt attempting to stand x5 with assist from OT and daughter, Pt cleared her bottom x3, however then began resisting assist and pushing back when OT and daughter attempted to assist.    Balance Overall balance assessment: Needs assistance Sitting-balance support: No upper extremity supported;Single extremity supported;Feet supported Sitting balance-Leahy Scale: Fair Sitting balance - Comments: Min guard w/ UE support Postural control: Posterior lean Standing balance support: Bilateral upper extremity supported Standing balance-Leahy Scale: Zero Standing balance comment: Pt unable to stand this session w/ HHA or RW                           ADL either performed or assessed with clinical judgement   ADL Overall ADL's : Needs assistance/impaired Eating/Feeding: Maximal assistance;Cueing for safety;Cueing for sequencing Eating/Feeding Details (indicate cue type and reason): daughter feeds pt and provides verbal cueing to chew and swallow Grooming: Maximal assistance;Sitting Grooming Details (indicate cue type and reason): requires verbal and tactile cues to bring a washcloth to her face to wash her face.                 Toilet Transfer: Maximal assistance;+2 for physical assistance;+2 for safety/equipment;Stand-pivot Toilet Transfer Details (indicate cue type and reason): Pt attempted to stand x5 and transfer, pt started to push back after 3rd attempts and resist assistance into standing/leaning forward.         Functional mobility during ADLs: Maximal assistance;+2 for safety/equipment;+2 for physical assistance General ADL Comments: Pt sat EOB with total A, cleaned her face  with max A, attempting standing x5 w/ max A +2 from OT and her daughter.     Vision        Perception     Praxis      Cognition Arousal/Alertness: Awake/alert Behavior During Therapy: Agitated;Anxious Overall Cognitive Status: History of cognitive impairments - at baseline                                 General Comments: pt with advanced dementia, follows commands inconsistently with both verbal and tactile cues, requires tactile cuing and initiation of most mobility        Exercises Exercises: Other exercises Other Exercises Other Exercises: Seated Marching, BLE, 5 reps Other Exercises: Seated Kick outs, BLE, 5 reps Other Exercises: Reaching out, BUE, Seated, 5 reps Other Exercises: Reaching up, Seated, BUE, 5 reps   Shoulder Instructions       General Comments VSS on RA. No dizziness or potential syncopal episodes observed.    Pertinent Vitals/ Pain       Pain Assessment: No/denies pain  Home Living                                          Prior Functioning/Environment              Frequency  Min 2X/week        Progress Toward Goals  OT Goals(current goals can now be found in the care plan section)  Progress towards OT goals: Progressing toward goals  Acute Rehab OT Goals Patient Stated Goal: family goal to improve mobility and reduce falls risk OT Goal Formulation: With patient/family Time For Goal Achievement: 10/25/20 Potential to Achieve Goals: Fair ADL Goals Pt Will Perform Eating: sitting;with set-up Pt Will Perform Grooming: with supervision;sitting Pt Will Perform Upper Body Bathing: with min assist;sitting Pt Will Perform Lower Body Bathing: with min assist;sit to/from stand  Plan Discharge plan remains appropriate;Frequency remains appropriate    Co-evaluation                 AM-PAC OT "6 Clicks" Daily Activity     Outcome Measure   Help from another person eating meals?: A Lot Help from another person taking care of personal grooming?: A Lot Help from another person toileting,  which includes using toliet, bedpan, or urinal?: Total Help from another person bathing (including washing, rinsing, drying)?: Total Help from another person to put on and taking off regular upper body clothing?: A Lot Help from another person to put on and taking off regular lower body clothing?: Total 6 Click Score: 9    End of Session Equipment Utilized During Treatment: Gait belt;Rolling walker  OT Visit Diagnosis: Unsteadiness on feet (R26.81);Other abnormalities of gait and mobility (R26.89);Muscle weakness (generalized) (M62.81)   Activity Tolerance Patient limited by fatigue;Other (comment) (Pt limited due to cognitive deficits.)   Patient Left in bed;with bed alarm set;with family/visitor present;with call bell/phone within reach   Nurse Communication Mobility status        Time: 3154-0086 OT Time Calculation (min): 32 min  Charges: OT General Charges $OT Visit: 1 Visit OT Treatments $Self Care/Home Management : 8-22 mins $Therapeutic Activity: 8-22 mins  Rjay Revolorio H., OTR/L Acute Rehabilitation   Pascal Stiggers Elane Alita Waldren 10/16/2020, 1:32 PM

## 2020-10-16 NOTE — TOC Transition Note (Signed)
Transition of Care [facility]) - CM/SW Discharge Note   Patient Details  Name: [name] [name] MRN: [id number] Date of Birth: 1933-04-01  [facility] of [facility] CM/SW Contact:  Leone Haven, RN Phone Number: 10/16/2020, 1:41 PM   Clinical Narrative:    Patient is [facility] up with [facility] for HHRN, HHPT, HHOT.  Patient lives with daughter ,who takes care of her.  [employer] will be delivering the hospital bed tomorrow.  Plan for possible dc tomorrow.   Final next level of care: Home w Home Health Services Barriers to Discharge: Continued Medical Work up   Patient Goals and CMS Choice Patient states their goals for this hospitalization and ongoing recovery are:: return home CMS Medicare.gov Compare Post Acute Care list provided to:: Patient Represenative (must comment) Choice offered to / list presented to : Adult Children  Discharge Placement                       Discharge Plan and Services                DME Arranged: Hospital bed, Lightweight manual wheelchair with seat cushion DME Agency: [employer] Date DME Agency Contacted: 10/14/20 Time DME Agency Contacted: 1022 Representative spoke with at DME Agency: Silvio Pate HH Arranged: RN, PT, OT HH Agency: [facility] Date [facility] Agency Contacted: 10/14/20 Time HH Agency Contacted: 1300 Representative spoke with at [facility] Agency: Kandee Keen  Social Determinants of Health (SDOH) Interventions     Readmission Risk Interventions No flowsheet data found.

## 2020-10-17 ENCOUNTER — Other Ambulatory Visit: Payer: Self-pay | Admitting: Orthopedic Surgery

## 2020-10-17 DIAGNOSIS — E876 Hypokalemia: Secondary | ICD-10-CM

## 2020-10-17 DIAGNOSIS — F411 Generalized anxiety disorder: Secondary | ICD-10-CM

## 2020-10-17 DIAGNOSIS — N179 Acute kidney failure, unspecified: Secondary | ICD-10-CM

## 2020-10-17 DIAGNOSIS — R338 Other retention of urine: Secondary | ICD-10-CM

## 2020-10-17 LAB — BASIC METABOLIC PANEL
Anion gap: 11 (ref 5–15)
BUN: 11 mg/dL (ref 8–23)
CO2: 24 mmol/L (ref 22–32)
Calcium: 9.3 mg/dL (ref 8.9–10.3)
Chloride: 106 mmol/L (ref 98–111)
Creatinine, Ser: 0.74 mg/dL (ref 0.44–1.00)
GFR, Estimated: >60 mL/min (ref >60)
Glucose, Bld: 94 mg/dL (ref 70–99)
Potassium: 3.4 mmol/L — ABNORMAL LOW (ref 3.5–5.1)
Sodium: 141 mmol/L (ref 135–145)

## 2020-10-17 LAB — MAGNESIUM: Magnesium: 1.8 mg/dL (ref 1.7–2.4)

## 2020-10-17 MED ORDER — TAMSULOSIN HCL 0.4 MG PO CAPS: 0.4000 mg | ORAL_CAPSULE | Freq: Every day | ORAL | Status: DC

## 2020-10-17 MED ORDER — POLYETHYLENE GLYCOL 3350 17 G PO PACK: 17.0000 g | PACK | Freq: Every day | ORAL | Status: DC

## 2020-10-17 MED ORDER — TAMSULOSIN HCL 0.4 MG PO CAPS: 0.4000 mg | ORAL_CAPSULE | Freq: Every day | ORAL | 0 refills | Status: DC

## 2020-10-17 MED ORDER — POTASSIUM CHLORIDE CRYS ER 20 MEQ PO TBCR
40.0000 meq | EXTENDED_RELEASE_TABLET | Freq: Once | ORAL | Status: AC
  Administered 2020-10-17: 40 meq via ORAL
  Filled 2020-10-17: qty 2

## 2020-10-17 NOTE — TOC Transition Note (Signed)
Transition of Care [facility]) - CM/SW Discharge Note   Patient Details  Name: [name] [name] MRN: [id number] Date of Birth: [birth year]  Transition of Care [facility]) CM/SW Contact:  Leone Haven, RN Phone Number: 10/17/2020, 10:16 AM   Clinical Narrative:    Patient is for dc today, NCM spoke with [name] her daughter she states her brother will be transporting patient home today.  She states they still have not received the hospital bed.  NCM contacted Shelia with [employer] , she states she will contact Robin regarding the hospital bed. NCM notiified Cory with [facility] regarding dc.   Final next level of care: Home w Home Health Services Barriers to Discharge: No Barriers Identified   Patient Goals and CMS Choice Patient states their goals for this hospitalization and ongoing recovery are:: return home CMS Medicare.gov Compare Post Acute Care list provided to:: Patient Represenative (must comment) Choice offered to / list presented to : Adult Children  Discharge Placement                       Discharge Plan and Services                DME Arranged: Hospital bed, Lightweight manual wheelchair with seat cushion DME Agency: [employer] Date DME Agency Contacted: 10/14/20 Time DME Agency Contacted: 1022 Representative spoke with at DME Agency: Silvio Pate HH Arranged: RN, PT, OT HH Agency: [facility] Date [facility] Agency Contacted: 10/14/20 Time HH Agency Contacted: 1300 Representative spoke with at [facility] Agency: Kandee Keen  Social Determinants of Health (SDOH) Interventions     Readmission Risk Interventions No flowsheet data found.

## 2020-10-17 NOTE — Discharge Summary (Signed)
Physician Discharge Summary  [name] [name] WUJ:[id number] DOB: March 31, 1933 DOA: 10/10/2020  PCP: [name], NP  Admit date: 10/10/2020 Discharge date: 10/17/2020  Admitted From: Home Disposition:  Home   Recommendations for Outpatient Follow-up:  Follow up with PCP in 1 week Follow up with Urology as outpatient to evaluate for acute urinary retention. Foley catheter replaced on 6/17 prior to discharge  Discharge Condition: Stable CODE STATUS: DNR  Diet recommendation: Regular   Brief/Interim Summary: [name] [name] is an 87 year old female with past medical history significant for advanced dementia, CAD status post CABG, resolved ischemic cardiomyopathy, hypertension who presented to the hospital with shortness of breath.  In the emergency department, patient was found to be febrile with wheezes.  Chest x-ray did not show infiltrates.  Patient was started on empiric antibiotics for possible pneumonia/bronchitis.  Also given 1 dose of Lasix for possible CHF exacerbation.  During hospitalization, patient was found to have acute urinary retention with AKI secondary to postobstructive etiology.  Nephrology was consulted.  Patient had Foley catheter placed, kidney function improved. Foley catheter was removed but patient failed voiding trial and it was replaced prior to discharge home.   Discharge Diagnoses:  Principal Problem:   Acute bronchitis Active Problems:   Hypothyroidism   S/P CABG x 3   Coronary artery disease   Dementia with behavioral disturbance (HCC)   Acute CHF (congestive heart failure) (HCC)   AKI (acute kidney injury) (HCC)   Acute urinary retention    Acute bronchitis -Steroids discontinued due to worsening confusion -Completed antibiotic treatment  Advanced dementia with behavioral disturbance -Improved and likely at her baseline now.  She may have waxing and waning delirium due to hospitalization   AKI -Due to postobstructive etiology -Nephrology signed  off -Resolved with Foley catheter placement   Acute urinary retention -Remove Foley catheter 6/15 -Failed voiding trial  -Replaced foley 6/17  -Flomax started   Hypothyroidism -Continue Synthroid  Hypertension -Continue amlodipine  CAD status post CABG   Fever -Check UA, blood culture -Afebrile last 24 hours   Hypokalemia -Replace, trend    Discharge Instructions  Discharge Instructions     (HEART FAILURE PATIENTS) Call MD:  Anytime you have any of the following symptoms: 1) 3 pound weight gain in 24 hours or 5 pounds in 1 week 2) shortness of breath, with or without a dry hacking cough 3) swelling in the hands, feet or stomach 4) if you have to sleep on extra pillows at night in order to breathe.   Complete by: As directed    Call MD for:  difficulty breathing, headache or visual disturbances   Complete by: As directed    Call MD for:  extreme fatigue   Complete by: As directed    Call MD for:  persistant dizziness or light-headedness   Complete by: As directed    Call MD for:  persistant nausea and vomiting   Complete by: As directed    Call MD for:  severe uncontrolled pain   Complete by: As directed    Call MD for:  temperature >100.4   Complete by: As directed    Discharge instructions   Complete by: As directed    You were cared for by a hospitalist during your hospital stay. If you have any questions about your discharge medications or the care you received while you were in the hospital after you are discharged, you can call the unit and ask to speak with the hospitalist on call if the  hospitalist that took care of you is not available. Once you are discharged, your primary care physician will handle any further medical issues. Please note that NO REFILLS for any discharge medications will be authorized once you are discharged, as it is imperative that you return to your primary care physician (or establish a relationship with a primary care physician if you do  not have one) for your aftercare needs so that they can reassess your need for medications and monitor your lab values.   Increase activity slowly   Complete by: As directed       Allergies as of 10/17/2020       Reactions   Namenda [memantine]    Trazodone And Nefazodone         Medication List     STOP taking these medications    doxycycline 100 MG tablet Commonly known as: VIBRA-TABS       TAKE these medications    acetaminophen 500 MG tablet Commonly known as: TYLENOL Take 1,000 mg by mouth every 6 (six) hours as needed (pain).   ALPRAZolam 0.25 MG tablet Commonly known as: XANAX TAKE 1 TABLET BY MOUTH TWICE A DAY AS NEEDED FOR ANXIETY What changed: See the new instructions.   amLODipine 10 MG tablet Commonly known as: NORVASC Take 10 mg by mouth daily.   busPIRone 30 MG tablet Commonly known as: BUSPAR Take 15 mg by mouth 4 (four) times daily.   Calcium-Magnesium-Vitamin D 600-40-500 MG-MG-UNIT Tb24 Take 1 tablet by mouth daily.   ezetimibe 10 MG tablet Commonly known as: ZETIA Take 10 mg by mouth daily.   latanoprost 0.005 % ophthalmic solution Commonly known as: XALATAN Place 1 drop into the left eye at bedtime.   levothyroxine 100 MCG tablet Commonly known as: SYNTHROID TAKE 1 TABLET BY MOUTH DAILY BEFORE BREAKFAST. What changed:  how much to take how to take this when to take this additional instructions   mirtazapine 15 MG tablet Commonly known as: REMERON TAKE 1 TABLET BY MOUTH EVERYDAY AT BEDTIME What changed: See the new instructions.   polyethylene glycol 17 g packet Commonly known as: MIRALAX / GLYCOLAX Take 17 g by mouth daily.   tamsulosin 0.4 MG Caps capsule Commonly known as: FLOMAX Take 1 capsule (0.4 mg total) by mouth daily after supper.   Vitamin D3 50 MCG (2000 UT) Tabs Take 1 capsule by mouth daily.               Durable Medical Equipment  (From admission, onward)           Start     Ordered    10/14/20 1028  For home use only DME lightweight manual wheelchair with seat cushion  Once       Comments: Patient suffers from weakness, chf which impairs their ability to perform daily activities like bathing, dressing, feeding, grooming, and toileting in the home.  A cane or walker will not resolve  issue with performing activities of daily living. A wheelchair will allow patient to safely perform daily activities. Patient is not able to propel themselves in the home using a standard weight wheelchair due to general weakness. Patient can self propel in the lightweight wheelchair. Length of need Lifetime. Accessories: elevating leg rests (ELRs), wheel locks, extensions and anti-tippers.   10/14/20 1029   10/14/20 1027  For home use only DME Hospital bed  Once       Question Answer Comment  Length of Need Lifetime   Patient has (  list medical condition): chf   The above medical condition requires: Patient requires the ability to reposition frequently   Head must be elevated greater than: 30 degrees   Bed type Semi-electric   Hoyer Lift Yes   Support Surface: Gel Overlay      10/14/20 1027            Follow-up Information     [facility] Follow up.   Why: hospital bed and wheelchair Contact information: [address] [place] [address] [phone]         [facility] Follow up.   Specialty: Home Health Services Why: HHPT Contact information: [address] [place] [address] [phone]         [name], NP. Schedule an appointment as soon as possible for a visit in 1 week(s).   Specialty: Geriatric Medicine Contact information: [address] [place] [address] [phone]         [facility]. Schedule an appointment as soon as possible for a visit in 3 week(s).   Why: For acute urinary retention in hospital, discharged with foley catheter. Contact information: [address] [place] [address] [place] [address] [phone]               Allergies  Allergen Reactions   Namenda [Memantine]    Trazodone And Nefazodone      Procedures/Studies: US RENAL  Result Date: 10/13/2020 CLINICAL DATA:  Acute kidney injury EXAM: RENAL / URINARY TRACT ULTRASOUND COMPLETE COMPARISON:  None. FINDINGS: Technical note: Examination technically limited by patient condition and inability to breath hold. Right Kidney: Renal measurements: 11.4 x 4.4 x 6.0 cm = volume: 157 mL. Echogenicity within normal limits. Mild hydronephrosis. No mass or shadowing stone visualized. Left Kidney: Renal measurements: 10.1 x 4.8 x 5.2 cm = volume: 133 mL. Echogenicity within normal limits. 13 mm echogenic, shadowing calcification the upper pole of the left kidney. No mass or hydronephrosis visualized. Bladder: Decompressed by Foley catheter. Other: None. IMPRESSION: 1. Limited exam. 2. Mild right hydronephrosis. 3. Probable nonobstructing 1.3 cm left upper calculus. Electronically Signed   By: Duanne Guess D.O.   On: 10/13/2020 18:13   DG Chest Port 1 View  Result Date: 10/10/2020 CLINICAL DATA:  Sepsis. EXAM: PORTABLE CHEST 1 VIEW COMPARISON:  02/21/2020 FINDINGS: The heart is enlarged but stable. Moderate tortuosity and calcification of the thoracic aorta. Stable surgical changes from bypass surgery. No acute pulmonary findings. No pleural effusions or pneumothorax. The bony thorax is intact. IMPRESSION: Cardiac enlargement but no acute pulmonary findings. Electronically Signed   By: Rudie Meyer M.D.   On: 10/10/2020 18:55       Discharge Exam: Vitals:   10/16/20 2028 10/17/20 0537  BP:  140/86  Pulse:  78  Resp:  18  Temp:  (!) 97.5 F (36.4 C)  SpO2: 100% 95%     General: Pt is alert, awake, not in acute distress Cardiovascular: RRR, S1/S2 +, no edema Respiratory: CTA bilaterally, no wheezing, no rhonchi, no respiratory distress, on room air  Abdominal: Soft, NT, ND, bowel  sounds + Extremities: no edema, no cyanosis Psych: Advanced dementia     The results of significant diagnostics from this hospitalization (including imaging, microbiology, ancillary and laboratory) are listed below for reference.     Microbiology: Recent Results (from the past 240 hour(s))  Resp Panel by RT-PCR (Flu A&B, Covid) Nasopharyngeal Swab     Status:  None   Collection Time: 10/10/20  4:24 PM   Specimen: Nasopharyngeal Swab; Nasopharyngeal(NP) swabs in vial transport medium  Result Value Ref Range Status   SARS Coronavirus 2 by RT PCR NEGATIVE NEGATIVE Final    Comment: (NOTE) SARS-CoV-2 target nucleic acids are NOT DETECTED.  The SARS-CoV-2 RNA is generally detectable in upper respiratory specimens during the acute phase of infection. The lowest concentration of SARS-CoV-2 viral copies this assay can detect is 138 copies/mL. A negative result does not preclude SARS-Cov-2 infection and should not be used as the sole basis for treatment or other patient management decisions. A negative result may occur with  improper specimen collection/handling, submission of specimen other than nasopharyngeal swab, presence of viral mutation(s) within the areas targeted by this assay, and inadequate number of viral copies(<138 copies/mL). A negative result must be combined with clinical observations, patient history, and epidemiological information. The expected result is Negative.  Fact Sheet for Patients:  BloggerCourse.com  Fact Sheet for Healthcare Providers:  SeriousBroker.it  This test is no t yet approved or cleared by the [place] FDA and  has been authorized for detection and/or diagnosis of SARS-CoV-2 by FDA under an Emergency Use Authorization (EUA). This EUA will remain  in effect (meaning this test can be used) for the duration of the COVID-19 declaration under Section 564(b)(1) of the Act, 21 U.S.C.section  360bbb-3(b)(1), unless the authorization is terminated  or revoked sooner.       Influenza A by PCR NEGATIVE NEGATIVE Final   Influenza B by PCR NEGATIVE NEGATIVE Final    Comment: (NOTE) The Xpert Xpress SARS-CoV-2/FLU/RSV plus assay is intended as an aid in the diagnosis of influenza from Nasopharyngeal swab specimens and should not be used as a sole basis for treatment. Nasal washings and aspirates are unacceptable for Xpert Xpress SARS-CoV-2/FLU/RSV testing.  Fact Sheet for Patients: BloggerCourse.com  Fact Sheet for Healthcare Providers: SeriousBroker.it  This test is not yet approved or cleared by the [place] FDA and has been authorized for detection and/or diagnosis of SARS-CoV-2 by FDA under an Emergency Use Authorization (EUA). This EUA will remain in effect (meaning this test can be used) for the duration of the COVID-19 declaration under Section 564(b)(1) of the Act, 21 U.S.C. section 360bbb-3(b)(1), unless the authorization is terminated or revoked.  Performed at [facility], [address] [place], [place] [address]   Blood Culture (routine x 2)     Status: None   Collection Time: 10/10/20  4:44 PM   Specimen: BLOOD  Result Value Ref Range Status   Specimen Description BLOOD LEFT ANTECUBITAL  Final   Special Requests   Final    BOTTLES DRAWN AEROBIC AND ANAEROBIC Blood Culture results may not be optimal due to an inadequate volume of blood received in culture bottles   Culture   Final    NO GROWTH 5 DAYS Performed at [facility], [address], [place], [place] [address]    Report Status 10/15/2020 FINAL  Final  Blood Culture (routine x 2)     Status: None   Collection Time: 10/10/20  4:44 PM   Specimen: BLOOD RIGHT HAND  Result Value Ref Range Status   Specimen Description BLOOD RIGHT HAND  Final   Special Requests   Final    BOTTLES DRAWN AEROBIC AND ANAEROBIC Blood Culture adequate  volume   Culture   Final    NO GROWTH 5 DAYS Performed at [facility], [address] [place], [place] [address]    Report Status 10/15/2020 FINAL  Final  Urine culture     Status: Abnormal   Collection Time: 10/10/20  6:52 PM   Specimen: In/Out Cath Urine  Result Value Ref Range Status   Specimen Description IN/OUT CATH URINE  Final   Special Requests   Final    NONE Performed at [facility], [address], [place], [place] [address]    Culture MULTIPLE SPECIES PRESENT, SUGGEST RECOLLECTION (A)  Final   Report Status 10/12/2020 FINAL  Final  Culture, Urine     Status: None   Collection Time: 10/13/20 12:17 PM   Specimen: Urine, Clean Catch  Result Value Ref Range Status   Specimen Description URINE, CLEAN CATCH  Final   Special Requests NONE  Final   Culture   Final    NO GROWTH Performed at [facility], [address], [place], [place] [address]    Report Status 10/14/2020 FINAL  Final  Culture, blood (routine x 2)     Status: None (Preliminary result)   Collection Time: 10/15/20  2:45 PM   Specimen: BLOOD RIGHT HAND  Result Value Ref Range Status   Specimen Description BLOOD RIGHT HAND  Final   Special Requests   Final    BOTTLES DRAWN AEROBIC AND ANAEROBIC Blood Culture adequate volume   Culture   Final    NO GROWTH 2 DAYS Performed at [facility], [address] [place], [place] [address]    Report Status PENDING  Incomplete  Culture, blood (routine x 2)     Status: None (Preliminary result)   Collection Time: 10/15/20  2:53 PM   Specimen: BLOOD LEFT HAND  Result Value Ref Range Status   Specimen Description BLOOD LEFT HAND  Final   Special Requests   Final    BOTTLES DRAWN AEROBIC AND ANAEROBIC Blood Culture results may not be optimal due to an excessive volume of blood received in culture bottles   Culture   Final    NO GROWTH 2 DAYS Performed at [facility], [address] [place], [place] [address]    Report Status  PENDING  Incomplete     Labs: BNP (last 3 results) Recent Labs    10/10/20 1614  BNP 231.4*   Basic Metabolic Panel: Recent Labs  Lab 10/12/20 0951 10/13/20 0141 10/14/20 0323 10/16/20 0316 10/17/20 0140  NA 134* 133* 139 143 141  K 4.1 4.1 3.7 3.0* 3.4*  CL 102 102 108 106 106  CO2 20* 21* 22 26 24   GLUCOSE 142* 137* 108* 104* 94  BUN 30* 42* 22 10 11   CREATININE 1.86* 2.64* 0.96 0.71 0.74  CALCIUM 9.5 9.4 9.0 9.0 9.3  MG  --   --   --   --  1.8   Liver Function Tests: Recent Labs  Lab 10/10/20 1845 10/13/20 0141  AST 26 28  ALT 14 15  ALKPHOS 41 38  BILITOT 0.5 0.4  PROT 7.6 7.0  ALBUMIN 3.8 3.4*   No results for input(s): LIPASE, AMYLASE in the last 168 hours. No results for input(s): AMMONIA in the last 168 hours. CBC: Recent Labs  Lab 10/10/20 1845 10/11/20 0026 10/13/20 0233 10/16/20 0316  WBC 5.1 5.0 12.1* 7.9  NEUTROABS 4.0  --   --   --   HGB 11.8* 13.2 12.4 11.4*  HCT 36.0 39.3 37.3 34.0*  MCV 86.3 84.9 85.7 84.4  PLT  198 192 233 247   Cardiac Enzymes: No results for input(s): CKTOTAL, CKMB, CKMBINDEX, TROPONINI in the last 168 hours. BNP: Invalid input(s): POCBNP CBG: Recent Labs  Lab 10/14/20 1558 10/14/20 2342 10/15/20 0513 10/15/20 1141 10/15/20 2118  GLUCAP 106* 88 89 103* 159*   D-Dimer No results for input(s): DDIMER in the last 72 hours. Hgb A1c No results for input(s): HGBA1C in the last 72 hours. Lipid Profile No results for input(s): CHOL, HDL, LDLCALC, TRIG, CHOLHDL, LDLDIRECT in the last 72 hours. Thyroid function studies No results for input(s): TSH, T4TOTAL, T3FREE, THYROIDAB in the last 72 hours.  Invalid input(s): FREET3 Anemia work up No results for input(s): VITAMINB12, FOLATE, FERRITIN, TIBC, IRON, RETICCTPCT in the last 72 hours. Urinalysis    Component Value Date/Time   COLORURINE YELLOW 10/10/2020 1613   APPEARANCEUR HAZY (A) 10/10/2020 1613   LABSPEC 1.010 10/10/2020 1613   PHURINE 5.0 10/10/2020  1613   GLUCOSEU NEGATIVE 10/10/2020 1613   HGBUR SMALL (A) 10/10/2020 1613   BILIRUBINUR NEGATIVE 10/10/2020 1613   BILIRUBINUR Negative 09/21/2019 1635   KETONESUR 5 (A) 10/10/2020 1613   PROTEINUR NEGATIVE 10/10/2020 1613   UROBILINOGEN 0.2 09/21/2019 1635   NITRITE NEGATIVE 10/10/2020 1613   LEUKOCYTESUR NEGATIVE 10/10/2020 1613   Sepsis Labs Invalid input(s): PROCALCITONIN,  WBC,  LACTICIDVEN Microbiology Recent Results (from the past 240 hour(s))  Resp Panel by RT-PCR (Flu A&B, Covid) Nasopharyngeal Swab     Status: None   Collection Time: 10/10/20  4:24 PM   Specimen: Nasopharyngeal Swab; Nasopharyngeal(NP) swabs in vial transport medium  Result Value Ref Range Status   SARS Coronavirus 2 by RT PCR NEGATIVE NEGATIVE Final    Comment: (NOTE) SARS-CoV-2 target nucleic acids are NOT DETECTED.  The SARS-CoV-2 RNA is generally detectable in upper respiratory specimens during the acute phase of infection. The lowest concentration of SARS-CoV-2 viral copies this assay can detect is 138 copies/mL. A negative result does not preclude SARS-Cov-2 infection and should not be used as the sole basis for treatment or other patient management decisions. A negative result may occur with  improper specimen collection/handling, submission of specimen other than nasopharyngeal swab, presence of viral mutation(s) within the areas targeted by this assay, and inadequate number of viral copies(<138 copies/mL). A negative result must be combined with clinical observations, patient history, and epidemiological information. The expected result is Negative.  Fact Sheet for Patients:  BloggerCourse.com  Fact Sheet for Healthcare Providers:  SeriousBroker.it  This test is no t yet approved or cleared by the [place] FDA and  has been authorized for detection and/or diagnosis of SARS-CoV-2 by FDA under an Emergency Use Authorization (EUA). This  EUA will remain  in effect (meaning this test can be used) for the duration of the COVID-19 declaration under Section 564(b)(1) of the Act, 21 U.S.C.section 360bbb-3(b)(1), unless the authorization is terminated  or revoked sooner.       Influenza A by PCR NEGATIVE NEGATIVE Final   Influenza B by PCR NEGATIVE NEGATIVE Final    Comment: (NOTE) The Xpert Xpress SARS-CoV-2/FLU/RSV plus assay is intended as an aid in the diagnosis of influenza from Nasopharyngeal swab specimens and should not be used as a sole basis for treatment. Nasal washings and aspirates are unacceptable for Xpert Xpress SARS-CoV-2/FLU/RSV testing.  Fact Sheet for Patients: BloggerCourse.com  Fact Sheet for Healthcare Providers: SeriousBroker.it  This test is not yet approved or cleared by the [place] FDA and has been authorized for detection and/or diagnosis  of SARS-CoV-2 by FDA under an Emergency Use Authorization (EUA). This EUA will remain in effect (meaning this test can be used) for the duration of the COVID-19 declaration under Section 564(b)(1) of the Act, 21 U.S.C. section 360bbb-3(b)(1), unless the authorization is terminated or revoked.  Performed at [facility], [address] [place], [place] [address]   Blood Culture (routine x 2)     Status: None   Collection Time: 10/10/20  4:44 PM   Specimen: BLOOD  Result Value Ref Range Status   Specimen Description BLOOD LEFT ANTECUBITAL  Final   Special Requests   Final    BOTTLES DRAWN AEROBIC AND ANAEROBIC Blood Culture results may not be optimal due to an inadequate volume of blood received in culture bottles   Culture   Final    NO GROWTH 5 DAYS Performed at [facility], [address], [place], [place] [address]    Report Status 10/15/2020 FINAL  Final  Blood Culture (routine x 2)     Status: None   Collection Time: 10/10/20  4:44 PM   Specimen: BLOOD RIGHT HAND  Result  Value Ref Range Status   Specimen Description BLOOD RIGHT HAND  Final   Special Requests   Final    BOTTLES DRAWN AEROBIC AND ANAEROBIC Blood Culture adequate volume   Culture   Final    NO GROWTH 5 DAYS Performed at [facility], [address], [place], [place] [address]    Report Status 10/15/2020 FINAL  Final  Urine culture     Status: Abnormal   Collection Time: 10/10/20  6:52 PM   Specimen: In/Out Cath Urine  Result Value Ref Range Status   Specimen Description IN/OUT CATH URINE  Final   Special Requests   Final    NONE Performed at [facility], [address], [place], [place] [address]    Culture MULTIPLE SPECIES PRESENT, SUGGEST RECOLLECTION (A)  Final   Report Status 10/12/2020 FINAL  Final  Culture, Urine     Status: None   Collection Time: 10/13/20 12:17 PM   Specimen: Urine, Clean Catch  Result Value Ref Range Status   Specimen Description URINE, CLEAN CATCH  Final   Special Requests NONE  Final   Culture   Final    NO GROWTH Performed at [facility], [address] [place], [place] [address]    Report Status 10/14/2020 FINAL  Final  Culture, blood (routine x 2)     Status: None (Preliminary result)   Collection Time: 10/15/20  2:45 PM   Specimen: BLOOD RIGHT HAND  Result Value Ref Range Status   Specimen Description BLOOD RIGHT HAND  Final   Special Requests   Final    BOTTLES DRAWN AEROBIC AND ANAEROBIC Blood Culture adequate volume   Culture   Final    NO GROWTH 2 DAYS Performed at [facility], [address], [place], [place] [address]    Report Status PENDING  Incomplete  Culture, blood (routine x 2)     Status: None (Preliminary result)   Collection Time: 10/15/20  2:53 PM   Specimen: BLOOD LEFT HAND  Result Value Ref Range Status   Specimen Description BLOOD LEFT HAND  Final   Special Requests   Final    BOTTLES DRAWN AEROBIC AND ANAEROBIC Blood Culture results may not be optimal due to an excessive volume of blood  received in culture bottles   Culture  Final    NO GROWTH 2 DAYS Performed at [facility], [address] [place], [place] [id number]    Report Status PENDING  Incomplete     Patient was seen and examined on the day of discharge and was found to be in stable condition. Time coordinating discharge: 20 minutes including assessment and coordination of care, as well as examination of the patient.   SIGNED:  Noralee StainJennifer Aynsley Fleet, DO [facility] 10/17/2020, 9:39 AM

## 2020-10-17 NOTE — Progress Notes (Signed)
Placed 16Fr foley cath for discharge home pt to follow up with urology discussed with Daughter [name] on care and leg bag and standard bag.

## 2020-10-19 DIAGNOSIS — I440 Atrioventricular block, first degree: Secondary | ICD-10-CM

## 2020-10-19 DIAGNOSIS — I509 Heart failure, unspecified: Secondary | ICD-10-CM

## 2020-10-19 DIAGNOSIS — I2510 Atherosclerotic heart disease of native coronary artery without angina pectoris: Secondary | ICD-10-CM

## 2020-10-19 DIAGNOSIS — G9340 Encephalopathy, unspecified: Secondary | ICD-10-CM

## 2020-10-19 DIAGNOSIS — I110 Hypertensive heart disease with heart failure: Secondary | ICD-10-CM | POA: Diagnosis not present

## 2020-10-19 DIAGNOSIS — J209 Acute bronchitis, unspecified: Secondary | ICD-10-CM | POA: Diagnosis not present

## 2020-10-19 DIAGNOSIS — F0391 Unspecified dementia with behavioral disturbance: Secondary | ICD-10-CM | POA: Diagnosis not present

## 2020-10-19 DIAGNOSIS — A419 Sepsis, unspecified organism: Secondary | ICD-10-CM | POA: Diagnosis not present

## 2020-10-20 ENCOUNTER — Telehealth: Payer: Self-pay | Admitting: *Deleted

## 2020-10-20 ENCOUNTER — Other Ambulatory Visit: Payer: Self-pay | Admitting: Nurse Practitioner

## 2020-10-20 LAB — CULTURE, BLOOD (ROUTINE X 2)
Culture: NO GROWTH
Culture: NO GROWTH
Special Requests: ADEQUATE

## 2020-10-20 MED ORDER — TAMSULOSIN HCL 0.4 MG PO CAPS: 0.4000 mg | ORAL_CAPSULE | Freq: Every day | ORAL | 3 refills | Status: DC

## 2020-10-20 NOTE — Telephone Encounter (Signed)
Nurse Notified.   Nurse Wants to know if you would send Rx to a compounding pharmacy which is [facility] cause she cannot take the capsule.   Make this medication into a tablet form---note needs to be placed on Rx for them to compound Rx.   Please Advise.

## 2020-10-20 NOTE — Telephone Encounter (Signed)
1) okay to give orders for home health nursing.  2) flomax only comes in a capsule.   3) would prefer they call if any concerns for UTI. No PRN order at this time.

## 2020-10-20 NOTE — Addendum Note (Signed)
Addended by: [name] on: 10/20/2020 03:20 PM   Modules accepted: Orders

## 2020-10-20 NOTE — Telephone Encounter (Signed)
Patient was discharged home with [facility] services.  1.) Lawanna Kobus, Nurse with [facility], called requesting verbal orders for: SN 2x1wk, 1x3wks ST 1x1wk  2.) Also, Hospital gave patient Flomax capsules and patient is unable to open the capsule to take. Nurse is wanting to know if you would send a new Rx for Flomax tablets to [facility] instead.   3.) Nurse is also wanting a PRN order to obtain U/A and Cx if patient shows signs of infection due to Urinary Cath. Stated that patient does not see Urology until July.   Please Advise.

## 2020-10-20 NOTE — Telephone Encounter (Signed)
Patient's daughter [name]) called and requested to speak to Synetta Fail about patients medication. She reports that patient's Flomax should be send [facility] for them to change the medication to a compound medication. [name] only wanted to speak with Synetta Fail due to a nurse previously speaking to [place] about what was going on. Message routed to [facility], Please advise.

## 2020-10-20 NOTE — Telephone Encounter (Signed)
Transition Care Management Follow-up Telephone Call Date of discharge and from where: 10/17/2020 [facility]   How have you been since you were released from the hospital? Getting better Any questions or concerns? No  Items Reviewed: Did the pt receive and understand the discharge instructions provided? Yes  Medications obtained and verified? Yes  Other? No  Any new allergies since your discharge? No  Dietary orders reviewed? Yes Do you have support at home? Yes   Home Care and Equipment/Supplies: Were home health services ordered? yes If so, what is the name of the agency? [facility]  Has the agency set up a time to come to the patient's home? yes Were any new equipment or medical supplies ordered?  No What is the name of the medical supply agency?  Were you able to get the supplies/equipment? not applicable Do you have any questions related to the use of the equipment or supplies? No  Functional Questionnaire: (I = Independent and D = Dependent) ADLs: I with assistance, walker  Bathing/Dressing- I with assistance  Meal Prep- D  Eating- I  Maintaining continence- I  Transferring/Ambulation- I with assistance  Managing Meds- D  Follow up appointments reviewed:  PCP Hospital f/u appt confirmed? Yes  Scheduled to see [facility] on 6/27 @ . Specialist Hospital f/u appt confirmed? No  Scheduled to see  on . Are transportation arrangements needed? No  If their condition worsens, is the pt aware to call PCP or go to the Emergency Dept.? Yes Was the patient provided with contact information for the PCP's office or ED? Yes Was to pt encouraged to call back with questions or concerns? Yes

## 2020-10-20 NOTE — Telephone Encounter (Signed)
[name], [name], NP  Braydee Shimkus A, CMA Hi Synetta Fail,  Patient's son did not want me seeing her mom .Please reschedule her with PCP Janyth Contes or another provider.  Thanks you  Contractor, FNP-C       Tried calling and LMOM to return call.

## 2020-10-21 ENCOUNTER — Telehealth: Payer: Self-pay | Admitting: *Deleted

## 2020-10-21 NOTE — Telephone Encounter (Signed)
Called and spoke with daughter and appointment scheduled with Amy for Thursday 6/23

## 2020-10-21 NOTE — Telephone Encounter (Signed)
-----   Message from Caesar Bookman, NP sent at 10/20/2020 12:24 PM EDT ----- Regarding: TOC Hi Synetta Fail, Patient's son did not want me seeing her mom .Please reschedule her with PCP Janyth Contes or another provider. Thanks you  Contractor, FNP-C

## 2020-10-22 NOTE — Telephone Encounter (Signed)
Merri Brunette nurse, called and stated that [facility] cannot convert the Flomax to a compound medication.   Requesting the medication to be changed to something different in Tablet form. Patient cannot take capsules.   Please Advise.

## 2020-10-22 NOTE — Telephone Encounter (Signed)
Son called and canceled [facility] appointment and stated that he does not see it necessary especially when home health comes in to. Explained to him the difference and he stated that he will just keep the appointment on 7/8 with Jessica. Only wants to see [name] in the afternoon.

## 2020-10-23 ENCOUNTER — Encounter: Payer: Self-pay | Admitting: Orthopedic Surgery

## 2020-10-23 MED ORDER — DOXAZOSIN MESYLATE 1 MG PO TABS: 1.0000 mg | ORAL_TABLET | Freq: Every day | ORAL | 0 refills | Status: DC

## 2020-10-23 NOTE — Telephone Encounter (Signed)
Lawanna Kobus Notified and agreed and will notify daughter.

## 2020-10-23 NOTE — Telephone Encounter (Signed)
To stop flomax and start doxazosin 1 mg by mouth daily. To monitor BP to make sure it does not get too low.  Rx sent to pharmacy.

## 2020-10-23 NOTE — Telephone Encounter (Signed)
LMOM for [name] [name] to return call.

## 2020-10-23 NOTE — Addendum Note (Signed)
Addended by: [name] on: 10/23/2020 11:42 AM   Modules accepted: Orders

## 2020-10-24 NOTE — Telephone Encounter (Signed)
[facility] nurse called and stated that she is seeing patient this afternoon.  Stated that daughter gave patient the Doxazosin this morning and took her BP and hour after and the BP was normal.   Nurse stated that she went this afternoon and patient's BP is 100/54 and Pulse 64. No other symptoms noted.   Is this ok. Please Advise.

## 2020-10-24 NOTE — Telephone Encounter (Signed)
Lawanna Kobus, Home health nurse notified and will make family aware.

## 2020-10-24 NOTE — Telephone Encounter (Signed)
Would increase miralax to twice daily, can also use fleets enema daily if needed

## 2020-10-24 NOTE — Telephone Encounter (Signed)
A little on the low side, monitor for dizziness, lightheadedness or being unsteady

## 2020-10-24 NOTE — Telephone Encounter (Signed)
[employer] and agreed.

## 2020-10-24 NOTE — Telephone Encounter (Signed)
Patient hasn't had a BM since Monday. Nurse is wondering is they can have a stool Softner or Laxitive.  Patient is currently taking the Miralax daily.   Please Advise.

## 2020-10-27 ENCOUNTER — Encounter: Payer: Self-pay | Admitting: Family

## 2020-10-28 ENCOUNTER — Telehealth: Payer: Self-pay | Admitting: *Deleted

## 2020-10-28 NOTE — Telephone Encounter (Signed)
LMOM to return call.

## 2020-10-28 NOTE — Telephone Encounter (Signed)
[name] [name] with [name] called and stated that she wanted to let you know that patient's Pulse was 58 (perimeter 60 or less).   Patient is also complaining about excessive fatigue. Stated that she is awake 4-5 hours in a 24 hour period.   Please Advise.

## 2020-10-28 NOTE — Telephone Encounter (Signed)
It sounds like she needs appt for evaluation.

## 2020-10-29 NOTE — Telephone Encounter (Signed)
Spoke with daughter, [name] and she stated that she thinks patient is fine. Stated that she had stayed up too long and got her day and night mixed up.  Stated that she will monitor her and if she feels patient needs an appointment sooner than 7/8 she will give [name] a call back.

## 2020-10-29 NOTE — Telephone Encounter (Signed)
Thank you :)

## 2020-11-07 ENCOUNTER — Ambulatory Visit (INDEPENDENT_AMBULATORY_CARE_PROVIDER_SITE_OTHER): Payer: Medicare PPO | Admitting: Nurse Practitioner

## 2020-11-07 ENCOUNTER — Encounter: Payer: Self-pay | Admitting: Nurse Practitioner

## 2020-11-07 ENCOUNTER — Other Ambulatory Visit: Payer: Self-pay | Admitting: Nurse Practitioner

## 2020-11-07 ENCOUNTER — Other Ambulatory Visit: Payer: Self-pay

## 2020-11-07 VITALS — BP 130/80 | HR 62 | Temp 98.0°F | Ht 59.0 in | Wt 146.4 lb

## 2020-11-07 DIAGNOSIS — E039 Hypothyroidism, unspecified: Secondary | ICD-10-CM

## 2020-11-07 DIAGNOSIS — R339 Retention of urine, unspecified: Secondary | ICD-10-CM

## 2020-11-07 DIAGNOSIS — K5904 Chronic idiopathic constipation: Secondary | ICD-10-CM

## 2020-11-07 DIAGNOSIS — G4700 Insomnia, unspecified: Secondary | ICD-10-CM

## 2020-11-07 DIAGNOSIS — F411 Generalized anxiety disorder: Secondary | ICD-10-CM

## 2020-11-07 DIAGNOSIS — F0391 Unspecified dementia with behavioral disturbance: Secondary | ICD-10-CM | POA: Diagnosis not present

## 2020-11-07 DIAGNOSIS — I10 Essential (primary) hypertension: Secondary | ICD-10-CM

## 2020-11-07 NOTE — Progress Notes (Signed)
Careteam: Patient Care Team: [name], NP as PCP - General (Geriatric Medicine) [name], MD as PCP - Cardiology (Cardiology)  PLACE OF SERVICE:  [facility]  Advanced Directive information Does Patient Have a Medical Advance Directive?: Yes, Type of Advance Directive: Out of facility DNR (pink MOST or yellow form), Pre-existing out of facility DNR order (yellow form or pink MOST form): Pink MOST form placed in chart (order not valid for inpatient use), Does patient want to make changes to medical advance directive?: No - Patient declined  Allergies  Allergen Reactions   Namenda [Memantine]    Trazodone And Nefazodone     Chief Complaint  Patient presents with   Follow-up    Patient was admitted to hospital for acute bronchitis and urinary retention.Patient sleeping more than normal.Patient accompanied by daughter today.   Health Maintenance    Tetanus/tdap,Zoster, 2nd COVID booster     HPI: Patient is a 87 y.o. female for hospital follow up.  Pt with past medical history significant for advanced dementia, CAD status post CABG, resolved ischemic cardiomyopathy, hypertension who presented to the hospital with shortness of breath due to URI. Treated with antibiotic and steriods.   During hospitalization, patient was found to have acute urinary retention with AKI secondary to postobstructive etiology.  Nephrology was consulted.  Patient had Foley catheter placed, kidney function improved. Foley catheter was removed but patient failed voiding trial and it was replaced prior to discharge home. She pilled out the catheter at home. She got out of her bed (climbed over hospital bed) and went to bathroom.  She has been urinating well since. Family stopped cardura ( was making bp low).   They have not gone to urologist since she has gotten home.   Daughter reports there has been a progression of dementia. When she talks she does not always make sense. Daughter reports she  feels like she has lost her words.   She has not been eating well. She takes pills with applesauce.  Eats good dinner generally but this is the only meal.  Moving bowels well.   Anxiety- using buspar 15 mg- will take anywhere from 2-4 times daily. Overall anxiety stable but sometimes she has acute flares.   Review of Systems:  Review of Systems  Unable to perform ROS: Dementia   Past Medical History:  Diagnosis Date   Bronchitis    Carotid stenosis    a. Carotid US (10/15):  Bilateral ICA 1-39%   Coronary artery disease    a. NSTEMI >> LHC (02/11/14):  pLAD 95%, mLAD occl, CFX < 20%, pRCA 30%, EF 35%, ant and apical AK >> CABG   Dementia (HCC)    Glucose intolerance (impaired glucose tolerance)    a. A1c 6.1 (01/2014)   H/O non-ST elevation myocardial infarction (NSTEMI)    01/2014   HLD (hyperlipidemia)    Hypertension    Ischemic cardiomyopathy    a. EF 35% by cath at time of NSTEMI >> b. Echo (10/15):  EF 50% to 55%. Wall motion was normal; Grade 1 diastolic dysfunction.  Mild AI.  Ascending aorta mildly dilated. Mild MR.  Mild LAE.  PA peak pressure: 46 mm Hg (S).   Past Surgical History:  Procedure Laterality Date   CESAREAN SECTION     CORONARY ARTERY BYPASS GRAFT N/A 02/15/2014   Procedure: CORONARY ARTERY BYPASS GRAFTING (CABG) x three,  using left internal mammary artery and right leg greater saphenous vein harvested endoscopically;  Surgeon: Alleen Borne, MD;  Location: [facility] OR;  Service: Open Heart Surgery;  Laterality: N/A;   HIP ARTHROPLASTY Right 02/21/2020   Procedure: HEMIARTHROPLASTY ANTERIOR APPROACH;  Surgeon: Samson Frederic, MD;  Location: [facility] ORS;  Service: Orthopedics;  Laterality: Right;   LEFT HEART CATHETERIZATION WITH CORONARY ANGIOGRAM N/A 02/11/2014   Procedure: LEFT HEART CATHETERIZATION WITH CORONARY ANGIOGRAM;  Surgeon: Peter M Swaziland, MD;  Location: [facility] CATH LAB;  Service: Cardiovascular;  Laterality: N/A;   TEE WITHOUT CARDIOVERSION N/A 02/15/2014    Procedure: TRANSESOPHAGEAL ECHOCARDIOGRAM (TEE);  Surgeon: Alleen Borne, MD;  Location: [facility] OR;  Service: Open Heart Surgery;  Laterality: N/A;   Social History:   reports that she has never smoked. She has never used smokeless tobacco. She reports that she does not drink alcohol and does not use drugs.  Family History  Problem Relation Age of Onset   Stroke Mother    Hypertension Mother    Hypertension Sister    Diabetes Brother    Diabetes Sister    Heart attack Neg Hx    Breast cancer Neg Hx     Medications: Patient's Medications  New Prescriptions   No medications on file  Previous Medications   ACETAMINOPHEN (TYLENOL) 500 MG TABLET    Take 1,000 mg by mouth every 6 (six) hours as needed (pain).   ALPRAZOLAM (XANAX) 0.25 MG TABLET    TAKE 1 TABLET BY MOUTH TWICE A DAY AS NEEDED FOR ANXIETY   AMLODIPINE (NORVASC) 10 MG TABLET    Take 10 mg by mouth daily.   BUSPIRONE (BUSPAR) 30 MG TABLET    Take 15 mg by mouth 4 (four) times daily.    CALCIUM-MAGNESIUM-VITAMIN D 600-40-500 MG-MG-UNIT TB24    Take 1 tablet by mouth daily.   CHOLECALCIFEROL (VITAMIN D3) 50 MCG (2000 UT) TABS    Take 1 capsule by mouth daily.   EZETIMIBE (ZETIA) 10 MG TABLET    Take 10 mg by mouth daily.   LATANOPROST (XALATAN) 0.005 % OPHTHALMIC SOLUTION    Place 1 drop into the left eye at bedtime.    LEVOTHYROXINE (SYNTHROID) 100 MCG TABLET    TAKE 1 TABLET BY MOUTH EVERY DAY BEFORE BREAKFAST   MIRTAZAPINE (REMERON) 15 MG TABLET    TAKE 1 TABLET BY MOUTH EVERYDAY AT BEDTIME   POLYETHYLENE GLYCOL (MIRALAX / GLYCOLAX) 17 G PACKET    Take 17 g by mouth daily.  Modified Medications   No medications on file  Discontinued Medications   DOXAZOSIN (CARDURA) 1 MG TABLET    Take 1 tablet (1 mg total) by mouth daily.    Physical Exam:  Vitals:   11/07/20 1516  BP: 130/80  Pulse: 62  Temp: 98 F (36.7 C)  TempSrc: Temporal  SpO2: 96%  Weight: 146 lb 6.4 oz (66.4 kg)  Height: 4\' 11"  (1.499 m)   Body mass  index is 29.57 kg/m. Wt Readings from Last 3 Encounters:  11/07/20 146 lb 6.4 oz (66.4 kg)  10/17/20 153 lb 3.5 oz (69.5 kg)  09/17/20 153 lb 6.4 oz (69.6 kg)    Physical Exam Constitutional:      General: She is not in acute distress.    Appearance: She is well-developed. She is not diaphoretic.  HENT:     Head: Normocephalic and atraumatic.     Mouth/Throat:     Pharynx: No oropharyngeal exudate.  Eyes:     Conjunctiva/sclera: Conjunctivae normal.     Pupils: Pupils are equal, round,  and reactive to light.  Cardiovascular:     Rate and Rhythm: Normal rate and regular rhythm.     Heart sounds: Normal heart sounds.  Pulmonary:     Effort: Pulmonary effort is normal.     Breath sounds: Normal breath sounds.  Abdominal:     General: Bowel sounds are normal.     Palpations: Abdomen is soft.  Musculoskeletal:     Cervical back: Normal range of motion and neck supple.     Right lower leg: No edema.     Left lower leg: No edema.  Skin:    General: Skin is warm and dry.  Neurological:     Mental Status: She is alert. She is disoriented.     Gait: Gait abnormal (slow, daughter walks with her).  Psychiatric:        Mood and Affect: Mood normal.        Cognition and Memory: Cognition is impaired. Memory is impaired. She exhibits impaired recent memory and impaired remote memory.    Labs reviewed: Basic Metabolic Panel: Recent Labs    02/15/20 1136 02/21/20 0404 05/21/20 1218 06/13/20 1628 10/11/20 0026 10/12/20 0951 10/14/20 0323 10/16/20 0316 10/17/20 0140  NA 142   < > 140   < > 132*   < > 139 143 141  K 4.3   < > 4.1   < > 3.1*   < > 3.7 3.0* 3.4*  CL 107   < > 106   < > 98   < > 108 106 106  CO2 28   < > 25   < > 23   < > 22 26 24   GLUCOSE 83   < > 95   < > 112*   < > 108* 104* 94  BUN 10   < > 12   < > 11   < > 22 10 11   CREATININE 0.94*   < > 0.88   < > 0.98   < > 0.96 0.71 0.74  CALCIUM 10.2   < > 10.2   < > 9.2   < > 9.0 9.0 9.3  MG  --   --   --   --   --    --   --   --  1.8  TSH 0.51  --  2.83  --  5.740*  --   --   --   --    < > = values in this interval not displayed.   Liver Function Tests: Recent Labs    02/27/20 0300 05/21/20 1218 06/13/20 1628 10/10/20 1845 10/13/20 0141  AST 16   < > 13 26 28   ALT 12   < > 8 14 15   ALKPHOS 35*  --   --  41 38  BILITOT 0.9   < > 0.3 0.5 0.4  PROT 6.7   < > 7.9 7.6 7.0  ALBUMIN 3.4*  --   --  3.8 3.4*   < > = values in this interval not displayed.   No results for input(s): LIPASE, AMYLASE in the last 8760 hours. No results for input(s): AMMONIA in the last 8760 hours. CBC: Recent Labs    05/21/20 1218 06/13/20 1628 10/10/20 1845 10/11/20 0026 10/13/20 0233 10/16/20 0316  WBC 6.2 6.8 5.1 5.0 12.1* 7.9  NEUTROABS 3,695 3,992 4.0  --   --   --   HGB 12.3 12.3 11.8* 13.2 12.4 11.4*  HCT 37.2 37.1 36.0 39.3 37.3 34.0*  MCV 87.3 86.1 86.3 84.9 85.7 84.4  PLT 283 271 198 192 233 247   Lipid Panel: Recent Labs    02/15/20 1136 05/21/20 1218  CHOL 177 181  HDL 67 75  LDLCALC 96 86  TRIG 49 105  CHOLHDL 2.6 2.4   TSH: Recent Labs    02/15/20 1136 05/21/20 1218 10/11/20 0026  TSH 0.51 2.83 5.740*   A1C: Lab Results  Component Value Date   HGBA1C 6.1 (H) 02/11/2014     Assessment/Plan 1. Generalized anxiety disorder -ongoing but stable. Will continue current regimen.  2. Dementia with behavioral disturbance, unspecified dementia type (HCC) Progressive decline. Continues with support of family for iADL and ADls.   3. Chronic idiopathic constipation Controlled with miralax, titration to effect.   4. Acquired hypothyroidism -continues on synthroid 100 mcg - TSH  5. Urinary retention -she pulled catheter out and has been voiding since. They stopped cardura due to making blood pressure run low. Plans to follow up with urology - BASIC METABOLIC PANEL WITH GFR  6. Essential hypertension Well controlled on current regimen.  - BASIC METABOLIC PANEL WITH GFR - CBC  with Differential/Platelet  7. Insomnia, unspecified type Stable on remeron.    Next appt: 01/12/2021, as scheduled.  Janene Harvey. Biagio Borg  [facility] [phone]

## 2020-11-08 LAB — CBC WITH DIFFERENTIAL/PLATELET
Absolute Monocytes: 315 cells/uL (ref 200–950)
Basophils Absolute: 40 cells/uL (ref 0–200)
Basophils Relative: 0.8 %
Eosinophils Absolute: 200 cells/uL (ref 15–500)
Eosinophils Relative: 4 %
HCT: 37.1 % (ref 35.0–45.0)
Hemoglobin: 11.9 g/dL (ref 11.7–15.5)
Lymphs Abs: 2450 cells/uL (ref 850–3900)
MCH: 27.9 pg (ref 27.0–33.0)
MCHC: 32.1 g/dL (ref 32.0–36.0)
MCV: 86.9 fL (ref 80.0–100.0)
MPV: 8.8 fL (ref 7.5–12.5)
Monocytes Relative: 6.3 %
Neutro Abs: 1995 cells/uL (ref 1500–7800)
Neutrophils Relative %: 39.9 %
Platelets: 248 10*3/uL (ref 140–400)
RBC: 4.27 10*6/uL (ref 3.80–5.10)
RDW: 15.3 % — ABNORMAL HIGH (ref 11.0–15.0)
Total Lymphocyte: 49 %
WBC: 5 10*3/uL (ref 3.8–10.8)

## 2020-11-08 LAB — BASIC METABOLIC PANEL WITH GFR
BUN/Creatinine Ratio: 11 (calc) (ref 6–22)
BUN: 11 mg/dL (ref 7–25)
CO2: 26 mmol/L (ref 20–32)
Calcium: 10 mg/dL (ref 8.6–10.4)
Chloride: 106 mmol/L (ref 98–110)
Creat: 0.96 mg/dL — ABNORMAL HIGH (ref 0.60–0.88)
GFR, Est African American: 62 mL/min/{1.73_m2} (ref >=60)
GFR, Est Non African American: 54 mL/min/{1.73_m2} — ABNORMAL LOW (ref >=60)
Glucose, Bld: 90 mg/dL (ref 65–139)
Potassium: 4 mmol/L (ref 3.5–5.3)
Sodium: 141 mmol/L (ref 135–146)

## 2020-11-08 LAB — TSH: TSH: 6.55 mIU/L — ABNORMAL HIGH (ref 0.40–4.50)

## 2020-11-13 ENCOUNTER — Other Ambulatory Visit: Payer: Self-pay | Admitting: Nurse Practitioner

## 2020-11-13 DIAGNOSIS — F411 Generalized anxiety disorder: Secondary | ICD-10-CM

## 2020-11-13 NOTE — Telephone Encounter (Signed)
Pharmacy requested refill Epic LR: 10/17/2020 Contract on File.  Pended Rx and sent to [facility] for approval.

## 2020-11-14 ENCOUNTER — Other Ambulatory Visit: Payer: Self-pay | Admitting: Nurse Practitioner

## 2020-11-14 DIAGNOSIS — F411 Generalized anxiety disorder: Secondary | ICD-10-CM

## 2020-11-18 ENCOUNTER — Other Ambulatory Visit: Payer: Self-pay | Admitting: *Deleted

## 2020-11-18 DIAGNOSIS — F411 Generalized anxiety disorder: Secondary | ICD-10-CM

## 2020-11-18 MED ORDER — ALPRAZOLAM 0.25 MG PO TABS: ORAL_TABLET | ORAL | 0 refills | Status: DC

## 2020-11-18 NOTE — Telephone Encounter (Signed)
Patient requested refill.  Epic LR: 10/17/2020 Contract on File.  Pended Rx and sent to [facility] for approval. [name] out of office.)

## 2020-12-04 ENCOUNTER — Telehealth: Payer: Self-pay

## 2020-12-04 ENCOUNTER — Telehealth (INDEPENDENT_AMBULATORY_CARE_PROVIDER_SITE_OTHER): Payer: Medicare PPO | Admitting: Nurse Practitioner

## 2020-12-04 ENCOUNTER — Other Ambulatory Visit: Payer: Self-pay

## 2020-12-04 DIAGNOSIS — F411 Generalized anxiety disorder: Secondary | ICD-10-CM

## 2020-12-04 DIAGNOSIS — F0391 Unspecified dementia with behavioral disturbance: Secondary | ICD-10-CM

## 2020-12-04 MED ORDER — DIVALPROEX SODIUM 125 MG PO CSDR: 125.0000 mg | DELAYED_RELEASE_CAPSULE | Freq: Two times a day (BID) | ORAL | 1 refills | Status: DC

## 2020-12-04 NOTE — Progress Notes (Signed)
This service is provided via telemedicine  No vital signs collected/recorded due to the encounter was a telemedicine visit.   Location of patient (ex: home, work):  Home  Patient consents to a telephone visit:  yes, see encounter dated 12/04/2020  Location of the provider (ex: office, home):  Home  Name of any referring provider:  N/A  Names of all persons participating in the telemedicine service and their role in the encounter:  Abbey Chatters, Nurse Practitioner, Elveria Royals, CMA, patient and Hadley Pen.  Time spent on call:  8 minutes with medical assistant

## 2020-12-04 NOTE — Telephone Encounter (Signed)
Ms. jamyrah, saur are scheduled for a virtual visit with your provider today.    Just as we do with appointments in the office, we must obtain your consent to participate.  Your consent will be active for this visit and any virtual visit you may have with one of our providers in the next 365 days.    If you have a MyChart account, I can also send a copy of this consent to you electronically.  All virtual visits are billed to your insurance company just like a traditional visit in the office.  As this is a virtual visit, video technology does not allow for your provider to perform a traditional examination.  This may limit your provider's ability to fully assess your condition.  If your provider identifies any concerns that need to be evaluated in person or the need to arrange testing such as labs, EKG, etc, we will make arrangements to do so.    Although advances in technology are sophisticated, we cannot ensure that it will always work on either your end or our end.  If the connection with a video visit is poor, we may have to switch to a telephone visit.  With either a video or telephone visit, we are not always able to ensure that we have a secure connection.   I need to obtain your verbal consent now.   Are you willing to proceed with your visit today?   [name], son,Jamie, has provided verbal consent on 12/04/2020 for a virtual visit (video or telephone).   Elveria Royals, [facility] 12/04/2020  3:29 PM

## 2020-12-04 NOTE — Patient Instructions (Signed)
Start Depakote sprinkles at bedtime for 1 week, then can increase to twice daily due to agitation/behaviors.

## 2020-12-04 NOTE — Progress Notes (Signed)
Careteam: Patient Care Team: [name], NP as PCP - General (Geriatric Medicine) [name], MD as PCP - Cardiology (Cardiology)  Advanced Directive information    Allergies  Allergen Reactions   Namenda [Memantine]    Trazodone And Nefazodone     Chief Complaint  Patient presents with   Acute Visit    Discuss medications. Patient having lack of sleeping for past few weeks. Patient not resting and very agitated. Patient very combative.Speaking gibberish. Fear of patient's safety.     HPI: Patient is a 87 y.o. female for follow up.  Son reports increase in agitation. Lack of sleep recently. Goes to bed between 8 and 10 and then up at 12-1 and up until 6 and then goes back to bed for ~2 hours.  She is not resting.  Thinks that the agitation is worse due to lack of sleep. But increase anxiety. Napping a lot during the day some but rare.  A lot of the medication had reverse effect.  Son reports that she walks a lot with increase anxiety. She needs to be monitored while she is walking.  Increase agitation. Combative with daughter who is the primary caregiver.   Has not seen much benefit from the remeron   Tries to save the xanax until the night. Has tried to give 2 xanax at night but also has not improved anxiety.    Review of Systems:  Review of Systems  Unable to perform ROS: Dementia   Past Medical History:  Diagnosis Date   Bronchitis    Carotid stenosis    a. Carotid US (10/15):  Bilateral ICA 1-39%   Coronary artery disease    a. NSTEMI >> LHC (02/11/14):  pLAD 95%, mLAD occl, CFX < 20%, pRCA 30%, EF 35%, ant and apical AK >> CABG   Dementia (HCC)    Glucose intolerance (impaired glucose tolerance)    a. A1c 6.1 (01/2014)   H/O non-ST elevation myocardial infarction (NSTEMI)    01/2014   HLD (hyperlipidemia)    Hypertension    Ischemic cardiomyopathy    a. EF 35% by cath at time of NSTEMI >> b. Echo (10/15):  EF 50% to 55%. Wall motion was  normal; Grade 1 diastolic dysfunction.  Mild AI.  Ascending aorta mildly dilated. Mild MR.  Mild LAE.  PA peak pressure: 46 mm Hg (S).   Past Surgical History:  Procedure Laterality Date   CESAREAN SECTION     CORONARY ARTERY BYPASS GRAFT N/A 02/15/2014   Procedure: CORONARY ARTERY BYPASS GRAFTING (CABG) x three,  using left internal mammary artery and right leg greater saphenous vein harvested endoscopically;  Surgeon: Alleen Borne, MD;  Location: [facility] OR;  Service: Open Heart Surgery;  Laterality: N/A;   HIP ARTHROPLASTY Right 02/21/2020   Procedure: HEMIARTHROPLASTY ANTERIOR APPROACH;  Surgeon: Samson Frederic, MD;  Location: [facility] ORS;  Service: Orthopedics;  Laterality: Right;   LEFT HEART CATHETERIZATION WITH CORONARY ANGIOGRAM N/A 02/11/2014   Procedure: LEFT HEART CATHETERIZATION WITH CORONARY ANGIOGRAM;  Surgeon: Peter M Swaziland, MD;  Location: [facility] CATH LAB;  Service: Cardiovascular;  Laterality: N/A;   TEE WITHOUT CARDIOVERSION N/A 02/15/2014   Procedure: TRANSESOPHAGEAL ECHOCARDIOGRAM (TEE);  Surgeon: Alleen Borne, MD;  Location: [facility] OR;  Service: Open Heart Surgery;  Laterality: N/A;   Social History:   reports that she has never smoked. She has never used smokeless tobacco. She reports that she does not drink alcohol and does not use drugs.  Family History  Problem Relation Age of Onset   Stroke Mother    Hypertension Mother    Hypertension Sister    Diabetes Brother    Diabetes Sister    Heart attack Neg Hx    Breast cancer Neg Hx     Medications: Patient's Medications  New Prescriptions   No medications on file  Previous Medications   ACETAMINOPHEN (TYLENOL) 500 MG TABLET    Take 1,000 mg by mouth every 6 (six) hours as needed (pain).   ALPRAZOLAM (XANAX) 0.25 MG TABLET    Take one tablet by mouth twice daily as needed for anxiety   AMLODIPINE (NORVASC) 10 MG TABLET    Take 10 mg by mouth daily.   BUSPIRONE (BUSPAR) 30 MG TABLET    Take 15 mg by mouth 4 (four) times  daily.    CALCIUM-MAGNESIUM-VITAMIN D 600-40-500 MG-MG-UNIT TB24    Take 1 tablet by mouth daily.   CHOLECALCIFEROL (VITAMIN D3) 50 MCG (2000 UT) TABS    Take 1 capsule by mouth daily.   EZETIMIBE (ZETIA) 10 MG TABLET    Take 10 mg by mouth daily.   LATANOPROST (XALATAN) 0.005 % OPHTHALMIC SOLUTION    Place 1 drop into the left eye at bedtime.    LEVOTHYROXINE (SYNTHROID) 100 MCG TABLET    TAKE 1 TABLET BY MOUTH EVERY DAY BEFORE BREAKFAST   MIRTAZAPINE (REMERON) 15 MG TABLET    TAKE 1 TABLET BY MOUTH EVERYDAY AT BEDTIME   POLYETHYLENE GLYCOL (MIRALAX / GLYCOLAX) 17 G PACKET    Take 17 g by mouth daily.  Modified Medications   No medications on file  Discontinued Medications   No medications on file    Physical Exam:  There were no vitals filed for this visit. There is no height or weight on file to calculate BMI. Wt Readings from Last 3 Encounters:  11/07/20 146 lb 6.4 oz (66.4 kg)  10/17/20 153 lb 3.5 oz (69.5 kg)  09/17/20 153 lb 6.4 oz (69.6 kg)    Labs reviewed: Basic Metabolic Panel: Recent Labs    05/21/20 1218 06/13/20 1628 10/11/20 0026 10/12/20 0951 10/16/20 0316 10/17/20 0140 11/07/20 1539  NA 140   < > 132*   < > 143 141 141  K 4.1   < > 3.1*   < > 3.0* 3.4* 4.0  CL 106   < > 98   < > 106 106 106  CO2 25   < > 23   < > 26 24 26   GLUCOSE 95   < > 112*   < > 104* 94 90  BUN 12   < > 11   < > 10 11 11   CREATININE 0.88   < > 0.98   < > 0.71 0.74 0.96*  CALCIUM 10.2   < > 9.2   < > 9.0 9.3 10.0  MG  --   --   --   --   --  1.8  --   TSH 2.83  --  5.740*  --   --   --  6.55*   < > = values in this interval not displayed.   Liver Function Tests: Recent Labs    02/27/20 0300 05/21/20 1218 06/13/20 1628 10/10/20 1845 10/13/20 0141  AST 16   < > 13 26 28   ALT 12   < > 8 14 15   ALKPHOS 35*  --   --  41 38  BILITOT 0.9   < >  0.3 0.5 0.4  PROT 6.7   < > 7.9 7.6 7.0  ALBUMIN 3.4*  --   --  3.8 3.4*   < > = values in this interval not displayed.   No results  for input(s): LIPASE, AMYLASE in the last 8760 hours. No results for input(s): AMMONIA in the last 8760 hours. CBC: Recent Labs    06/13/20 1628 10/10/20 1845 10/11/20 0026 10/13/20 0233 10/16/20 0316 11/07/20 1539  WBC 6.8 5.1   < > 12.1* 7.9 5.0  NEUTROABS 3,992 4.0  --   --   --  1,995  HGB 12.3 11.8*   < > 12.4 11.4* 11.9  HCT 37.1 36.0   < > 37.3 34.0* 37.1  MCV 86.1 86.3   < > 85.7 84.4 86.9  PLT 271 198   < > 233 247 248   < > = values in this interval not displayed.   Lipid Panel: Recent Labs    02/15/20 1136 05/21/20 1218  CHOL 177 181  HDL 67 75  LDLCALC 96 86  TRIG 49 105  CHOLHDL 2.6 2.4   TSH: Recent Labs    05/21/20 1218 10/11/20 0026 11/07/20 1539  TSH 2.83 5.740* 6.55*   A1C: Lab Results  Component Value Date   HGBA1C 6.1 (H) 02/11/2014     Assessment/Plan 1. Dementia with behavioral disturbance, unspecified dementia type (HCC) Progressive decline. Son and daughter primary caregivers. Unable to tolerate a lot of medication with opposite effect.  - divalproex (DEPAKOTE SPRINKLES) 125 MG capsule; Take 1 capsule (125 mg total) by mouth 2 (two) times daily.  Dispense: 60 capsule; Refill: 1  2. Anxiety  -ongoing, continues on buspar and xanax PRN.   3. Insomnia Continues on Remeron. Still has a lot of trouble with sleep.   Next appt: 3 weeks for follow up.  Janene Harvey. Biagio Borg  [facility] [phone]    Virtual Visit via [place] video.   I connected with patient on 12/04/20 at  3:15 PM EDT by video and verified that I am speaking with the correct person using two identifiers.  Location: Patient: home Provider: home   I discussed the limitations, risks, security and privacy concerns of performing an evaluation and management service by telephone and the availability of in person appointments. I also discussed with the patient that there may be a patient responsible charge related to this service. The  patient expressed understanding and agreed to proceed.   I discussed the assessment and treatment plan with the patient. The patient was provided an opportunity to ask questions and all were answered. The patient agreed with the plan and demonstrated an understanding of the instructions.   The patient was advised to call back or seek an in-person evaluation if the symptoms worsen or if the condition fails to improve as anticipated.  I provided 21 minutes of non-face-to-face time during this encounter.  Janene Harvey. Biagio Borg Avs printed and mailed

## 2020-12-08 ENCOUNTER — Telehealth: Payer: Self-pay | Admitting: *Deleted

## 2020-12-08 DIAGNOSIS — F0391 Unspecified dementia with behavioral disturbance: Secondary | ICD-10-CM

## 2020-12-08 DIAGNOSIS — F411 Generalized anxiety disorder: Secondary | ICD-10-CM

## 2020-12-08 MED ORDER — ALPRAZOLAM 0.25 MG PO TABS: ORAL_TABLET | ORAL | 0 refills | Status: DC

## 2020-12-08 MED ORDER — DIVALPROEX SODIUM 125 MG PO CSDR: DELAYED_RELEASE_CAPSULE | ORAL | 1 refills | Status: DC

## 2020-12-08 NOTE — Telephone Encounter (Signed)
Patient daughter stated that the son was mistaken, she stated that she gives patient her medications and has been giving her the Xanax One in the Morning and Two in the evening.   Patient did start on the Depakote and slept well the first night but hasn't since.   Requesting the refill on the Xanax.   Please Advise.

## 2020-12-08 NOTE — Telephone Encounter (Signed)
The son reported that they were saving xanax and only giving it at night (not giving in the morning as well)   She was also started on the depakote- has she been taking that? Has she been resting better in the evenings?

## 2020-12-08 NOTE — Telephone Encounter (Signed)
Spoke with Daughter and she stated that she will cut back on the Xanax to One in the evening and if you would send a Rx in and she is good with Increasing the Depakote to help patient rest better.   Please Advise.

## 2020-12-08 NOTE — Telephone Encounter (Signed)
Patient daughter, [name], called and stated that patient is needing a refill on her Xanax.  Stated that [name] had increased it to help patient sleep on Video Visit 8/4. ( I have reviewed but didn't see the change for Xanax and not in current medication list).  Daughter has been giving her Xanax 0.25 ONE in the morning and TWO at Bedtime.   Needs a refill after increasing it.  Last Epic RF: 11/18/2020 Contract on File.   Please Advise.

## 2020-12-08 NOTE — Telephone Encounter (Signed)
Rx sent 

## 2020-12-08 NOTE — Telephone Encounter (Signed)
Patient daughter notified and agreed.  

## 2020-12-08 NOTE — Telephone Encounter (Signed)
Would like for them to cut back to only 1 xanax in the evening since she is also getting the Depakote. We can always increase Depakote in the evening to see if this helps with sleep which I was under the impression was the main issues.  If everything is going well on the current regimen we will keep that.

## 2020-12-17 ENCOUNTER — Other Ambulatory Visit: Payer: Self-pay

## 2020-12-17 MED ORDER — BUSPIRONE HCL 30 MG PO TABS: 15.0000 mg | ORAL_TABLET | Freq: Four times a day (QID) | ORAL | 1 refills | Status: DC

## 2020-12-23 ENCOUNTER — Other Ambulatory Visit: Payer: Self-pay

## 2020-12-23 ENCOUNTER — Telehealth (INDEPENDENT_AMBULATORY_CARE_PROVIDER_SITE_OTHER): Payer: Medicare PPO | Admitting: Nurse Practitioner

## 2020-12-23 DIAGNOSIS — F411 Generalized anxiety disorder: Secondary | ICD-10-CM

## 2020-12-23 DIAGNOSIS — F5104 Psychophysiologic insomnia: Secondary | ICD-10-CM

## 2020-12-23 DIAGNOSIS — F0391 Unspecified dementia with behavioral disturbance: Secondary | ICD-10-CM | POA: Diagnosis not present

## 2020-12-23 MED ORDER — BUSPIRONE HCL 30 MG PO TABS: 15.0000 mg | ORAL_TABLET | Freq: Four times a day (QID) | ORAL | 1 refills | Status: AC

## 2020-12-23 NOTE — Progress Notes (Signed)
Careteam: Patient Care Team: [name], NP as PCP - General (Geriatric Medicine) [name], MD as PCP - Cardiology (Cardiology)  Advanced Directive information    Allergies  Allergen Reactions   Namenda [Memantine]    Trazodone And Nefazodone     Chief Complaint  Patient presents with   Follow-up    Follow up on medication. Divalproex. seems to be working. Doesn't seem to work well with mirtazapine. Anxiety still seems to be out of control at times.Would also like to discuss buspirone.     HPI: Patient is a 87 y.o. female  for follow up on mood.  Reports she does not sleep well on the mirtazapine at time.  Started depakote 1 tablets in the morning and 2 tablets in the evening. Daughter reports this is helping her sleep. Will occasionally wake up in the middle of the night to to go the bathroom and sometimes she will go back to sleep and sometimes she does not.  Reports agitation and anxiety during the day is stable. Some days are worse than other.  She tries to walk but does not walk well. Daughter has been there with her to supervise her.   Not eating or drinking well. She does not like to drink a lot of fluids. Tries to encourage different food and drinks.  Daughter has to feed her.    Review of Systems:  Review of Systems  Unable to perform ROS: Dementia  Past Medical History:  Diagnosis Date   Bronchitis    Carotid stenosis    a. Carotid US (10/15):  Bilateral ICA 1-39%   Coronary artery disease    a. NSTEMI >> LHC (02/11/14):  pLAD 95%, mLAD occl, CFX < 20%, pRCA 30%, EF 35%, ant and apical AK >> CABG   Dementia (HCC)    Glucose intolerance (impaired glucose tolerance)    a. A1c 6.1 (01/2014)   H/O non-ST elevation myocardial infarction (NSTEMI)    01/2014   HLD (hyperlipidemia)    Hypertension    Ischemic cardiomyopathy    a. EF 35% by cath at time of NSTEMI >> b. Echo (10/15):  EF 50% to 55%. Wall motion was normal; Grade 1 diastolic  dysfunction.  Mild AI.  Ascending aorta mildly dilated. Mild MR.  Mild LAE.  PA peak pressure: 46 mm Hg (S).   Past Surgical History:  Procedure Laterality Date   CESAREAN SECTION     CORONARY ARTERY BYPASS GRAFT N/A 02/15/2014   Procedure: CORONARY ARTERY BYPASS GRAFTING (CABG) x three,  using left internal mammary artery and right leg greater saphenous vein harvested endoscopically;  Surgeon: Alleen Borne, MD;  Location: [facility] OR;  Service: Open Heart Surgery;  Laterality: N/A;   HIP ARTHROPLASTY Right 02/21/2020   Procedure: HEMIARTHROPLASTY ANTERIOR APPROACH;  Surgeon: Samson Frederic, MD;  Location: [facility] ORS;  Service: Orthopedics;  Laterality: Right;   LEFT HEART CATHETERIZATION WITH CORONARY ANGIOGRAM N/A 02/11/2014   Procedure: LEFT HEART CATHETERIZATION WITH CORONARY ANGIOGRAM;  Surgeon: Peter M Swaziland, MD;  Location[facility] CATH LAB;  Service: Cardiovascular;  Laterality: N/A;   TEE WITHOUT CARDIOVERSION N/A 02/15/2014   Procedure: TRANSESOPHAGEAL ECHOCARDIOGRAM (TEE);  Surgeon: Alleen Borne, MD;  Location: [facility] OR;  Service: Open Heart Surgery;  Laterality: N/A;   Social History:   reports that she has never smoked. She has never used smokeless tobacco. She reports that she does not drink alcohol and does not use drugs.  Family History  Problem Relation  Age of Onset   Stroke Mother    Hypertension Mother    Hypertension Sister    Diabetes Brother    Diabetes Sister    Heart attack Neg Hx    Breast cancer Neg Hx     Medications: Patient's Medications  New Prescriptions   No medications on file  Previous Medications   ACETAMINOPHEN (TYLENOL) 500 MG TABLET    Take 1,000 mg by mouth every 6 (six) hours as needed (pain).   ALPRAZOLAM (XANAX) 0.25 MG TABLET    Take one tablet by mouth twice daily as needed for anxiety   AMLODIPINE (NORVASC) 10 MG TABLET    Take 10 mg by mouth daily.   BUSPIRONE (BUSPAR) 30 MG TABLET    Take 0.5 tablets (15 mg total) by mouth 4 (four) times daily.    CALCIUM-MAGNESIUM-VITAMIN D 600-40-500 MG-MG-UNIT TB24    Take 1 tablet by mouth daily.   CHOLECALCIFEROL (VITAMIN D3) 50 MCG (2000 UT) TABS    Take 1 capsule by mouth daily.   DIVALPROEX (DEPAKOTE SPRINKLES) 125 MG CAPSULE    To use 1 capsule in the morning and 2 in the evening   EZETIMIBE (ZETIA) 10 MG TABLET    Take 10 mg by mouth daily.   LATANOPROST (XALATAN) 0.005 % OPHTHALMIC SOLUTION    Place 1 drop into the left eye at bedtime.    LEVOTHYROXINE (SYNTHROID) 100 MCG TABLET    TAKE 1 TABLET BY MOUTH EVERY DAY BEFORE BREAKFAST   MIRTAZAPINE (REMERON) 15 MG TABLET    TAKE 1 TABLET BY MOUTH EVERYDAY AT BEDTIME   POLYETHYLENE GLYCOL (MIRALAX / GLYCOLAX) 17 G PACKET    Take 17 g by mouth daily.  Modified Medications   No medications on file  Discontinued Medications   No medications on file    Physical Exam:  There were no vitals filed for this visit. There is no height or weight on file to calculate BMI. Wt Readings from Last 3 Encounters:  11/07/20 146 lb 6.4 oz (66.4 kg)  10/17/20 153 lb 3.5 oz (69.5 kg)  09/17/20 153 lb 6.4 oz (69.6 kg)      Labs reviewed: Basic Metabolic Panel: Recent Labs    05/21/20 1218 06/13/20 1628 10/11/20 0026 10/12/20 0951 10/16/20 0316 10/17/20 0140 11/07/20 1539  NA 140   < > 132*   < > 143 141 141  K 4.1   < > 3.1*   < > 3.0* 3.4* 4.0  CL 106   < > 98   < > 106 106 106  CO2 25   < > 23   < > 26 24 26   GLUCOSE 95   < > 112*   < > 104* 94 90  BUN 12   < > 11   < > 10 11 11   CREATININE 0.88   < > 0.98   < > 0.71 0.74 0.96*  CALCIUM 10.2   < > 9.2   < > 9.0 9.3 10.0  MG  --   --   --   --   --  1.8  --   TSH 2.83  --  5.740*  --   --   --  6.55*   < > = values in this interval not displayed.   Liver Function Tests: Recent Labs    02/27/20 0300 05/21/20 1218 06/13/20 1628 10/10/20 1845 10/13/20 0141  AST 16   < > 13 26 28   ALT 12   < >  8 14 15   ALKPHOS 35*  --   --  41 38  BILITOT 0.9   < > 0.3 0.5 0.4  PROT 6.7   < > 7.9 7.6  7.0  ALBUMIN 3.4*  --   --  3.8 3.4*   < > = values in this interval not displayed.   No results for input(s): LIPASE, AMYLASE in the last 8760 hours. No results for input(s): AMMONIA in the last 8760 hours. CBC: Recent Labs    06/13/20 1628 10/10/20 1845 10/11/20 0026 10/13/20 0233 10/16/20 0316 11/07/20 1539  WBC 6.8 5.1   < > 12.1* 7.9 5.0  NEUTROABS 3,992 4.0  --   --   --  1,995  HGB 12.3 11.8*   < > 12.4 11.4* 11.9  HCT 37.1 36.0   < > 37.3 34.0* 37.1  MCV 86.1 86.3   < > 85.7 84.4 86.9  PLT 271 198   < > 233 247 248   < > = values in this interval not displayed.   Lipid Panel: Recent Labs    02/15/20 1136 05/21/20 1218  CHOL 177 181  HDL 67 75  LDLCALC 96 86  TRIG 49 105  CHOLHDL 2.6 2.4   TSH: Recent Labs    05/21/20 1218 10/11/20 0026 11/07/20 1539  TSH 2.83 5.740* 6.55*   A1C: Lab Results  Component Value Date   HGBA1C 6.1 (H) 02/11/2014     Assessment/Plan 1. Dementia with behavioral disturbance, unspecified dementia type (HCC) -progressive decline. Family working together to try to support her and help with ADLs -Depakote has been beneficial with agitation. Will continue at this time.  2. Generalized anxiety disorder -ongoing anxiety. Continue buspar - busPIRone (BUSPAR) 30 MG tablet; Take 0.5 tablets (15 mg total) by mouth 4 (four) times daily.  Dispense: 180 tablet; Refill: 1  3. Psychophysiological insomnia -on remeron which has helped sleep but having more issues at time. Will have family reduced remeron to 7.5 mg daily at bedtime, if not effective consider weaning off  Next appt: 2 months.  04/13/2014. Janene Harvey  [facility] [phone]    Virtual Visit via [phone]  I connected with patient on 12/23/20 at  3:45 PM EDT by video and verified that I am speaking with the correct person using two identifiers.  Location: Patient: home Provider: [facility]   I discussed the limitations, risks,  security and privacy concerns of performing an evaluation and management service by telephone and the availability of in person appointments. I also discussed with the patient that there may be a patient responsible charge related to this service. The patient expressed understanding and agreed to proceed.   I discussed the assessment and treatment plan with the patient. The patient was provided an opportunity to ask questions and all were answered. The patient agreed with the plan and demonstrated an understanding of the instructions.   The patient was advised to call back or seek an in-person evaluation if the symptoms worsen or if the condition fails to improve as anticipated.  I provided 20 minutes of non-face-to-face time during this encounter.  12/25/20. Janene Harvey Avs printed and mailed

## 2020-12-23 NOTE — Progress Notes (Signed)
This service is provided via telemedicine  No vital signs collected/recorded due to the encounter was a telemedicine visit.   Location of patient (ex: home, work):  Home  Patient consents to a telephone visit:  Yes, see encounter dated 12/04/2020  Location of the provider (ex: office, home):  [facility]  Name of any referring provider: N/A  Names of all persons participating in the telemedicine service and their role in the encounter:  Abbey Chatters, Nurse Practitioner, Elveria Royals, CMA, and patient.    Time spent on call:  8 minutes with medical assistant

## 2020-12-24 ENCOUNTER — Telehealth: Payer: Medicare PPO | Admitting: Nurse Practitioner

## 2020-12-26 ENCOUNTER — Other Ambulatory Visit: Payer: Self-pay | Admitting: Nurse Practitioner

## 2020-12-26 DIAGNOSIS — F0391 Unspecified dementia with behavioral disturbance: Secondary | ICD-10-CM

## 2020-12-26 MED ORDER — DIVALPROEX SODIUM 125 MG PO CSDR: DELAYED_RELEASE_CAPSULE | ORAL | 5 refills | Status: AC

## 2021-01-06 ENCOUNTER — Ambulatory Visit (INDEPENDENT_AMBULATORY_CARE_PROVIDER_SITE_OTHER): Payer: Medicare PPO | Admitting: Nurse Practitioner

## 2021-01-06 ENCOUNTER — Encounter: Payer: Self-pay | Admitting: Nurse Practitioner

## 2021-01-06 ENCOUNTER — Other Ambulatory Visit: Payer: Self-pay

## 2021-01-06 DIAGNOSIS — Z0000 Encounter for general adult medical examination without abnormal findings: Secondary | ICD-10-CM | POA: Diagnosis not present

## 2021-01-06 NOTE — Patient Instructions (Signed)
[name] , Thank you for taking time to come for your Medicare Wellness Visit. I appreciate your ongoing commitment to your health goals. Please review the following plan we discussed and let me know if I can assist you in the future.   Screening recommendations/referrals: Colonoscopy aged out Mammogram aged out Bone Density NA Recommended yearly ophthalmology/optometry visit for glaucoma screening and checkup Recommended yearly dental visit for hygiene and checkup  Vaccinations: Influenza vaccine recommended at this time.  Pneumococcal vaccine up to date Tdap vaccine recommended at this time. Shingles vaccine recommended at this time.    Advanced directives: on file  Conditions/risks identified: progression of dementia  Next appointment: yearly    Preventive Care 49 Years and Older, Female Preventive care refers to lifestyle choices and visits with your health care provider that can promote health and wellness. What does preventive care include? A yearly physical exam. This is also called an annual well check. Dental exams once or twice a year. Routine eye exams. Ask your health care provider how often you should have your eyes checked. Personal lifestyle choices, including: Daily care of your teeth and gums. Regular physical activity. Eating a healthy diet. Avoiding tobacco and drug use. Limiting alcohol use. Practicing safe sex. Taking low-dose aspirin every day. Taking vitamin and mineral supplements as recommended by your health care provider. What happens during an annual well check? The services and screenings done by your health care provider during your annual well check will depend on your age, overall health, lifestyle risk factors, and family history of disease. Counseling  Your health care provider may ask you questions about your: Alcohol use. Tobacco use. Drug use. Emotional well-being. Home and relationship well-being. Sexual activity. Eating  habits. History of falls. Memory and ability to understand (cognition). Work and work Astronomer. Reproductive health. Screening  You may have the following tests or measurements: Height, weight, and BMI. Blood pressure. Lipid and cholesterol levels. These may be checked every 5 years, or more frequently if you are over 70 years old. Skin check. Lung cancer screening. You may have this screening every year starting at age 43 if you have a 30-pack-year history of smoking and currently smoke or have quit within the past 15 years. Fecal occult blood test (FOBT) of the stool. You may have this test every year starting at age 69. Flexible sigmoidoscopy or colonoscopy. You may have a sigmoidoscopy every 5 years or a colonoscopy every 10 years starting at age 96. Hepatitis C blood test. Hepatitis B blood test. Sexually transmitted disease (STD) testing. Diabetes screening. This is done by checking your blood sugar (glucose) after you have not eaten for a while (fasting). You may have this done every 1-3 years. Bone density scan. This is done to screen for osteoporosis. You may have this done starting at age 26. Mammogram. This may be done every 1-2 years. Talk to your health care provider about how often you should have regular mammograms. Talk with your health care provider about your test results, treatment options, and if necessary, the need for more tests. Vaccines  Your health care provider may recommend certain vaccines, such as: Influenza vaccine. This is recommended every year. Tetanus, diphtheria, and acellular pertussis (Tdap, Td) vaccine. You may need a Td booster every 10 years. Zoster vaccine. You may need this after age 7. Pneumococcal 13-valent conjugate (PCV13) vaccine. One dose is recommended after age 72. Pneumococcal polysaccharide (PPSV23) vaccine. One dose is recommended after age 86. Talk to your health care  provider about which screenings and vaccines you need and how  often you need them. This information is not intended to replace advice given to you by your health care provider. Make sure you discuss any questions you have with your health care provider. Document Released: 05/16/2015 Document Revised: 01/07/2016 Document Reviewed: 02/18/2015 Elsevier Interactive Patient Education  2017 [employer] Prevention in the Home Falls can cause injuries. They can happen to people of all ages. There are many things you can do to make your home safe and to help prevent falls. What can I do on the outside of my home? Regularly fix the edges of walkways and driveways and fix any cracks. Remove anything that might make you trip as you walk through a door, such as a raised step or threshold. Trim any bushes or trees on the path to your home. Use bright outdoor lighting. Clear any walking paths of anything that might make someone trip, such as rocks or tools. Regularly check to see if handrails are loose or broken. Make sure that both sides of any steps have handrails. Any raised decks and porches should have guardrails on the edges. Have any leaves, snow, or ice cleared regularly. Use sand or salt on walking paths during winter. Clean up any spills in your garage right away. This includes oil or grease spills. What can I do in the bathroom? Use night lights. Install grab bars by the toilet and in the tub and shower. Do not use towel bars as grab bars. Use non-skid mats or decals in the tub or shower. If you need to sit down in the shower, use a plastic, non-slip stool. Keep the floor dry. Clean up any water that spills on the floor as soon as it happens. Remove soap buildup in the tub or shower regularly. Attach bath mats securely with double-sided non-slip rug tape. Do not have throw rugs and other things on the floor that can make you trip. What can I do in the bedroom? Use night lights. Make sure that you have a light by your bed that is easy to  reach. Do not use any sheets or blankets that are too big for your bed. They should not hang down onto the floor. Have a firm chair that has side arms. You can use this for support while you get dressed. Do not have throw rugs and other things on the floor that can make you trip. What can I do in the kitchen? Clean up any spills right away. Avoid walking on wet floors. Keep items that you use a lot in easy-to-reach places. If you need to reach something above you, use a strong step stool that has a grab bar. Keep electrical cords out of the way. Do not use floor polish or wax that makes floors slippery. If you must use wax, use non-skid floor wax. Do not have throw rugs and other things on the floor that can make you trip. What can I do with my stairs? Do not leave any items on the stairs. Make sure that there are handrails on both sides of the stairs and use them. Fix handrails that are broken or loose. Make sure that handrails are as long as the stairways. Check any carpeting to make sure that it is firmly attached to the stairs. Fix any carpet that is loose or worn. Avoid having throw rugs at the top or bottom of the stairs. If you do have throw rugs, attach them to the  floor with carpet tape. Make sure that you have a light switch at the top of the stairs and the bottom of the stairs. If you do not have them, ask someone to add them for you. What else can I do to help prevent falls? Wear shoes that: Do not have high heels. Have rubber bottoms. Are comfortable and fit you well. Are closed at the toe. Do not wear sandals. If you use a stepladder: Make sure that it is fully opened. Do not climb a closed stepladder. Make sure that both sides of the stepladder are locked into place. Ask someone to hold it for you, if possible. Clearly mark and make sure that you can see: Any grab bars or handrails. First and last steps. Where the edge of each step is. Use tools that help you move  around (mobility aids) if they are needed. These include: Canes. Walkers. Scooters. Crutches. Turn on the lights when you go into a dark area. Replace any light bulbs as soon as they burn out. Set up your furniture so you have a clear path. Avoid moving your furniture around. If any of your floors are uneven, fix them. If there are any pets around you, be aware of where they are. Review your medicines with your doctor. Some medicines can make you feel dizzy. This can increase your chance of falling. Ask your doctor what other things that you can do to help prevent falls. This information is not intended to replace advice given to you by your health care provider. Make sure you discuss any questions you have with your health care provider. Document Released: 02/13/2009 Document Revised: 09/25/2015 Document Reviewed: 05/24/2014 Elsevier Interactive Patient Education  2017 [employer].

## 2021-01-06 NOTE — Progress Notes (Signed)
Subjective:   [name] [name] is a 87 y.o. female who presents for Medicare Annual (Subsequent) preventive examination.  Review of Systems     Cardiac Risk Factors include: advanced age (>82men, >32 women);family history of premature cardiovascular disease;sedentary lifestyle;hypertension;dyslipidemia     Objective:    There were no vitals filed for this visit. There is no height or weight on file to calculate BMI.  Advanced Directives 01/06/2021 11/07/2020 10/10/2020 09/17/2020 05/21/2020 03/10/2020 02/21/2020  Does Patient Have a Medical Advance Directive? Yes Yes No Yes Yes No;Yes No  Type of Estate agent of [place];Living will Out of facility DNR (pink MOST or yellow form) - Public affairs consultant of [employer] of [place];Living will Healthcare Power of Attorney -  Does patient want to make changes to medical advance directive? No - Patient declined No - Patient declined - No - Patient declined No - Patient declined No - Patient declined -  Copy of Healthcare Power of Attorney in Chart? Yes - validated most recent copy scanned in chart (See row information) - - Yes - validated most recent copy scanned in chart (See row information) Yes - validated most recent copy scanned in chart (See row information) Yes - validated most recent copy scanned in chart (See row information) -  Would patient like information on creating a medical advance directive? - - No - Patient declined - - - No - Patient declined  Pre-existing out of facility DNR order (yellow form or pink MOST form) - Pink MOST form placed in chart (order not valid for inpatient use) - - - - -    Current Medications (verified) Outpatient Encounter Medications as of 01/06/2021  Medication Sig   acetaminophen (TYLENOL) 500 MG tablet Take 1,000 mg by mouth every 6 (six) hours as needed (pain).   ALPRAZolam (XANAX) 0.25 MG tablet Take one tablet by mouth twice daily as needed for anxiety   amLODipine (NORVASC)  10 MG tablet Take 10 mg by mouth daily.   busPIRone (BUSPAR) 30 MG tablet Take 0.5 tablets (15 mg total) by mouth 4 (four) times daily.   Calcium-Magnesium-Vitamin D 600-40-500 MG-MG-UNIT TB24 Take 1 tablet by mouth daily.   Cholecalciferol (VITAMIN D3) 50 MCG (2000 UT) TABS Take 1 capsule by mouth daily.   divalproex (DEPAKOTE SPRINKLES) 125 MG capsule To use 1 capsule in the morning and 2 in the evening   ezetimibe (ZETIA) 10 MG tablet Take 10 mg by mouth daily.   latanoprost (XALATAN) 0.005 % ophthalmic solution Place 1 drop into the left eye at bedtime.    levothyroxine (SYNTHROID) 100 MCG tablet TAKE 1 TABLET BY MOUTH EVERY DAY BEFORE BREAKFAST   mirtazapine (REMERON) 15 MG tablet TAKE 1 TABLET BY MOUTH EVERYDAY AT BEDTIME (Patient taking differently: 2-3 times per week)   polyethylene glycol (MIRALAX / GLYCOLAX) 17 g packet Take 17 g by mouth daily. (Patient taking differently: Take 17 g by mouth daily. Every 3-4 days)   No facility-administered encounter medications on file as of 01/06/2021.    Allergies (verified) Namenda [memantine] and Trazodone and nefazodone   History: Past Medical History:  Diagnosis Date   Bronchitis    Carotid stenosis    a. Carotid US (10/15):  Bilateral ICA 1-39%   Coronary artery disease    a. NSTEMI >> LHC (02/11/14):  pLAD 95%, mLAD occl, CFX < 20%, pRCA 30%, EF 35%, ant and apical AK >> CABG   Dementia (HCC)    Glucose intolerance (impaired glucose tolerance)  a. A1c 6.1 (01/2014)   H/O non-ST elevation myocardial infarction (NSTEMI)    01/2014   HLD (hyperlipidemia)    Hypertension    Ischemic cardiomyopathy    a. EF 35% by cath at time of NSTEMI >> b. Echo (10/15):  EF 50% to 55%. Wall motion was normal; Grade 1 diastolic dysfunction.  Mild AI.  Ascending aorta mildly dilated. Mild MR.  Mild LAE.  PA peak pressure: 46 mm Hg (S).   Past Surgical History:  Procedure Laterality Date   CESAREAN SECTION     CORONARY ARTERY BYPASS GRAFT N/A  02/15/2014   Procedure: CORONARY ARTERY BYPASS GRAFTING (CABG) x three,  using left internal mammary artery and right leg greater saphenous vein harvested endoscopically;  Surgeon: Alleen BorneBryan K Bartle, MD;  Location: [facility] OR;  Service: Open Heart Surgery;  Laterality: N/A;   HIP ARTHROPLASTY Right 02/21/2020   Procedure: HEMIARTHROPLASTY ANTERIOR APPROACH;  Surgeon: Samson FredericSwinteck, Brian, MD;  Location: [facility] ORS;  Service: Orthopedics;  Laterality: Right;   LEFT HEART CATHETERIZATION WITH CORONARY ANGIOGRAM N/A 02/11/2014   Procedure: LEFT HEART CATHETERIZATION WITH CORONARY ANGIOGRAM;  Surgeon: Peter M SwazilandJordan, MD;  [facility];  Service: Cardiovascular;  Laterality: N/A;   TEE WITHOUT CARDIOVERSION N/A 02/15/2014   Procedure: TRANSESOPHAGEAL ECHOCARDIOGRAM (TEE);  Surgeon: Alleen BorneBryan K Bartle, MD;  [facility];  Service: Open Heart Surgery;  Laterality: N/A;   Family History  Problem Relation Age of Onset   Stroke Mother    Hypertension Mother    Hypertension Sister    Diabetes Brother    Diabetes Sister    Heart attack Neg Hx    Breast cancer Neg Hx    Social History   Socioeconomic History   Marital status: Single    Spouse name: Not on file   Number of children: Not on file   Years of education: Not on file   Highest education level: Not on file  Occupational History   Not on file  Tobacco Use   Smoking status: Never   Smokeless tobacco: Never  Vaping Use   Vaping Use: Never used  Substance and Sexual Activity   Alcohol use: No   Drug use: No   Sexual activity: Not on file  Other Topics Concern   Not on file  Social History Narrative   Not on file   Social Determinants of Health   Financial Resource Strain: Not on file  Food Insecurity: Not on file  Transportation Needs: Not on file  Physical Activity: Not on file  Stress: Not on file  Social Connections: Not on file    Tobacco Counseling Counseling given: Not Answered   Clinical Intake:  Pre-visit preparation  completed: Yes  Pain : No/denies pain     BMI - recorded: 29 Nutritional Status: BMI 25 -29 Overweight Nutritional Risks: Unintentional weight loss Diabetes: No  How often do you need to have someone help you when you read instructions, pamphlets, or other written materials from your doctor or pharmacy?: 5 - Always  Diabetic?no         Activities of Daily Living In your present state of health, do you have any difficulty performing the following activities: 01/06/2021 10/10/2020  Hearing? N N  Vision? N N  Difficulty concentrating or making decisions? Y Y  Comment - dementia  Walking or climbing stairs? Y Y  Dressing or bathing? Y Y  Doing errands, shopping? Y N  Preparing Food and eating ? Y -  Using the  Toilet? Y -  In the past six months, have you accidently leaked urine? Y -  Do you have problems with loss of bowel control? Y -  Managing your Medications? Y -  Managing your Finances? Y -  Some recent data might be hidden    Patient Care Team: [name], NP as PCP - General (Geriatric Medicine) [name], MD as PCP - Cardiology (Cardiology)  Indicate any recent Medical Services you may have received from other than [facility] providers in the past year (date may be approximate).     Assessment:   This is a routine wellness examination for [place].  Hearing/Vision screen Hearing Screening - Comments:: Patient has no hearing problems. Vision Screening - Comments:: Patient has vision in one eye. Patient has not had eye exam due to immobility.  Dietary issues and exercise activities discussed: Current Exercise Habits: The patient does not participate in regular exercise at present   Goals Addressed   None    Depression Screen PHQ 2/9 Scores 01/06/2021 10/26/2019 05/21/2019  PHQ - 2 Score 0 0 0    Fall Risk Fall Risk  01/06/2021 12/23/2020 03/10/2020 02/15/2020 11/23/2019  Falls in the past year? 0 0 1 0 0  Number falls in past yr: 0 0 0 0 0  Injury with  Fall? 0 0 1 0 0  Comment - - Hip Injury - -  Risk for fall due to : No Fall Risks No Fall Risks Impaired balance/gait - -  Follow up Falls evaluation completed Falls evaluation completed - - -    FALL RISK PREVENTION PERTAINING TO THE HOME:  Any stairs in or around the home? No  If so, are there any without handrails? No  Home free of loose throw rugs in walkways, pet beds, electrical cords, etc? Yes  Adequate lighting in your home to reduce risk of falls? Yes   ASSISTIVE DEVICES UTILIZED TO PREVENT FALLS:  Life alert? No  Use of a cane, walker or w/c? Yes  Grab bars in the bathroom? No  Shower chair or bench in shower? Yes  Elevated toilet seat or a handicapped toilet? Yes   TIMED UP AND GO:  Was the test performed? No .    Cognitive Function: MMSE - Mini Mental State Exam 05/21/2019  Orientation to time 0  Orientation to Place 0  Registration 0  Attention/ Calculation 0  Recall 0  Language- name 2 objects 2  Language- repeat 1  Language- follow 3 step command 3  Language- read & follow direction 1  Write a sentence 1  Copy design 0  Total score 8        Immunizations Immunization History  Administered Date(s) Administered   Fluad Quad(high Dose 65+) 05/21/2019, 02/15/2020   PFIZER(Purple Top)SARS-COV-2 Vaccination 06/10/2019, 07/05/2019, 03/16/2020   Pneumococcal Conjugate-13 01/31/2012   Pneumococcal Polysaccharide-23 05/21/2019    TDAP status: Due, Education has been provided regarding the importance of this vaccine. Advised may receive this vaccine at local pharmacy or Health Dept. Aware to provide a copy of the vaccination record if obtained from local pharmacy or Health Dept. Verbalized acceptance and understanding.  Flu Vaccine status: Due, Education has been provided regarding the importance of this vaccine. Advised may receive this vaccine at local pharmacy or Health Dept. Aware to provide a copy of the vaccination record if obtained from local pharmacy  or Health Dept. Verbalized acceptance and understanding.  Pneumococcal vaccine status: Completed during today's visit.  Covid-19 vaccine status: Completed  vaccines  Qualifies for Shingles Vaccine? Yes   Zostavax completed No   Shingrix Completed?: No.    Education has been provided regarding the importance of this vaccine. Patient has been advised to call insurance company to determine out of pocket expense if they have not yet received this vaccine. Advised may also receive vaccine at local pharmacy or Health Dept. Verbalized acceptance and understanding.  Screening Tests Health Maintenance  Topic Date Due   TETANUS/TDAP  Never done   Zoster Vaccines- Shingrix (1 of 2) Never done   COVID-19 Vaccine (4 - Booster for [employer] series) 07/14/2020   INFLUENZA VACCINE  12/01/2020   DEXA SCAN  Completed   PNA vac Low Risk Adult  Completed   HPV VACCINES  Aged Out    Health Maintenance  Health Maintenance Due  Topic Date Due   TETANUS/TDAP  Never done   Zoster Vaccines- Shingrix (1 of 2) Never done   COVID-19 Vaccine (4 - Booster for [employer] series) 07/14/2020   INFLUENZA VACCINE  12/01/2020    Colorectal cancer screening: No longer required.   Mammogram status: No longer required due to age.  Bone density NA Lung Cancer Screening: (Low Dose CT Chest recommended if Age 60-80 years, 30 pack-year currently smoking OR have quit w/in 15years.) does not qualify.   Lung Cancer Screening Referral: na  Additional Screening:  Hepatitis C Screening: does not qualify;  Vision Screening: Recommended annual ophthalmology exams for early detection of glaucoma and other disorders of the eye. Is the patient up to date with their annual eye exam?  No  Who is the provider or what is the name of the office in which the patient attends annual eye exams? [facility] If pt is not established with a provider, would they like to be referred to a provider to establish care? No .   Dental Screening:  Recommended annual dental exams for proper oral hygiene  Community Resource Referral / Chronic Care Management: CRR required this visit?  No   CCM required this visit?  No      Plan:     I have personally reviewed and noted the following in the patient's chart:   Medical and social history Use of alcohol, tobacco or illicit drugs  Current medications and supplements including opioid prescriptions.  Functional ability and status Nutritional status Physical activity Advanced directives List of other physicians Hospitalizations, surgeries, and ER visits in previous 12 months Vitals Screenings to include cognitive, depression, and falls Referrals and appointments  In addition, I have reviewed and discussed with patient certain preventive protocols, quality metrics, and best practice recommendations. A written personalized care plan for preventive services as well as general preventive health recommendations were provided to patient.     [name], NP   01/06/2021   Virtual Visit via Telephone Note  I connected withNAME@ on 01/06/21 at  2:45 PM EDT by telephone and verified that I am speaking with the correct person using two identifiers.  Location: Patient: home Provider: [facility]   I discussed the limitations, risks, security and privacy concerns of performing an evaluation and management service by telephone and the availability of in person appointments. I also discussed with the patient that there may be a patient responsible charge related to this service. The patient expressed understanding and agreed to proceed.   I discussed the assessment and treatment plan with the patient. The patient was provided an opportunity to ask questions and all were answered. The patient agreed with  the plan and demonstrated an understanding of the instructions.   The patient was advised to call back or seek an in-person evaluation if the symptoms worsen or if the condition fails  to improve as anticipated.  I provided 18 minutes of non-face-to-face time during this encounter.  Janene Harvey. [employer] printed and mailed

## 2021-01-06 NOTE — Progress Notes (Signed)
This service is provided via telemedicine  No vital signs collected/recorded due to the encounter was a telemedicine visit.   Location of patient (ex: home, work):  Home  Patient consents to a telephone visit:  Yes, see encounter dated 12/04/2020  Location of the provider (ex: office, home):  [facility]  Name of any referring provider:  N/A  Names of all persons participating in the telemedicine service and their role in the encounter:  Abbey Chatters, Nurse Practitioner, Elveria Royals, CMA, patient and daughter [name].  Time spent on call:  10 minutes with medical assistant

## 2021-01-11 ENCOUNTER — Other Ambulatory Visit: Payer: Self-pay | Admitting: Nurse Practitioner

## 2021-01-11 DIAGNOSIS — F411 Generalized anxiety disorder: Secondary | ICD-10-CM

## 2021-01-12 ENCOUNTER — Ambulatory Visit: Payer: Medicare PPO | Admitting: Nurse Practitioner

## 2021-01-12 NOTE — Telephone Encounter (Signed)
Refill request received. Medication pended and sent to Abbey Chatters, NP for approval

## 2021-01-13 ENCOUNTER — Telehealth: Payer: Self-pay

## 2021-01-13 NOTE — Telephone Encounter (Signed)
Okay to take mucinex DM by mouth twice daily with full glass of water. If she has trouble with pills may need to take liquid guaifenesin with dextromethorphan OTC

## 2021-01-13 NOTE — Telephone Encounter (Signed)
Discussed with Robin.

## 2021-01-13 NOTE — Telephone Encounter (Signed)
[name], the patient's daughter called stating the patient has had a cough for a few days now. She has taken two covid tests and both have been negative. The cough doesn't seen to be affecting her sleep. [name] would just like to give her something OTC to help break up the mucous to get it out. She wants your suggestion because the patient is on blood pressure meds. To Areatha Keas, NP.

## 2021-01-27 ENCOUNTER — Other Ambulatory Visit: Payer: Self-pay

## 2021-01-27 ENCOUNTER — Telehealth: Payer: Medicare PPO | Admitting: Nurse Practitioner

## 2021-01-27 ENCOUNTER — Encounter: Payer: Self-pay | Admitting: Nurse Practitioner

## 2021-01-27 VITALS — Temp 99.4°F

## 2021-01-27 DIAGNOSIS — R627 Adult failure to thrive: Secondary | ICD-10-CM | POA: Diagnosis not present

## 2021-01-27 DIAGNOSIS — F0391 Unspecified dementia with behavioral disturbance: Secondary | ICD-10-CM

## 2021-01-29 ENCOUNTER — Telehealth: Payer: Medicare PPO | Admitting: Nurse Practitioner

## 2021-01-31 NOTE — Progress Notes (Signed)
   This service is provided via telemedicine  No vital signs collected/recorded due to the encounter was a telemedicine visit.   Location of patient (ex: home, work):  Home  Patient consents to a telephone visit: Yes, see telephone visit dated 12/04/2020  Location of the provider (ex: office, home):  [employer], [place] Location   Name of any referring provider:  N/A  Names of all persons participating in the telemedicine service and their role in the encounter:  S.Chrae B/CMA, Abbey Chatters, NP, [name] (daughter), and Patient   Time spent on call:  6 min with medical assistant

## 2021-01-31 NOTE — Progress Notes (Signed)
Careteam: Patient Care Team: [name], NP as PCP - General (Geriatric Medicine) [name], MD as PCP - Cardiology (Cardiology)  Advanced Directive information    Allergies  Allergen Reactions   Namenda [Memantine]    Trazodone And Nefazodone     Chief Complaint  Patient presents with   Acute Visit    Patient is bed written (unable to get out of bed). Patient not eating, drinking or taking medications x 3 days. Family is requesting a hospice referral. Patient appears to be in pain and breathing fast at times.      HPI: Patient is a 87 y.o. female due to acute decline. 5 days ago they put her in bed and then the next morning they could not wake her up. She is very sleepy. She is not swallowing. Everything is coming back out when daughter tries to get her to swallow. They have attempted to feed her and give her water.  When she is moving her breathing with increase like she is in pain. Other times she is resting comfortably.   Daughter says her son and her feel like she is ready to go. Daughter wants comfort only. Feels like transitioning at this time.   She is still urinating. Son and daughter have to clean her together.  She is total care.  Requesting hospice referral.   Review of Systems:  Review of Systems  Unable to perform ROS: Dementia   Past Medical History:  Diagnosis Date   Bronchitis    Carotid stenosis    a. Carotid US (10/15):  Bilateral ICA 1-39%   Coronary artery disease    a. NSTEMI >> LHC (02/11/14):  pLAD 95%, mLAD occl, CFX < 20%, pRCA 30%, EF 35%, ant and apical AK >> CABG   Dementia (HCC)    Glucose intolerance (impaired glucose tolerance)    a. A1c 6.1 (01/2014)   H/O non-ST elevation myocardial infarction (NSTEMI)    01/2014   HLD (hyperlipidemia)    Hypertension    Ischemic cardiomyopathy    a. EF 35% by cath at time of NSTEMI >> b. Echo (10/15):  EF 50% to 55%. Wall motion was normal; Grade 1 diastolic dysfunction.  Mild AI.   Ascending aorta mildly dilated. Mild MR.  Mild LAE.  PA peak pressure: 46 mm Hg (S).   Past Surgical History:  Procedure Laterality Date   CESAREAN SECTION     CORONARY ARTERY BYPASS GRAFT N/A 02/15/2014   Procedure: CORONARY ARTERY BYPASS GRAFTING (CABG) x three,  using left internal mammary artery and right leg greater saphenous vein harvested endoscopically;  Surgeon: Alleen Borne, MD;  Location: [facility] OR;  Service: Open Heart Surgery;  Laterality: N/A;   HIP ARTHROPLASTY Right 02/21/2020   Procedure: HEMIARTHROPLASTY ANTERIOR APPROACH;  Surgeon: Samson Frederic, MD;  Location: [facility] ORS;  Service: Orthopedics;  Laterality: Right;   LEFT HEART CATHETERIZATION WITH CORONARY ANGIOGRAM N/A 02/11/2014   Procedure: LEFT HEART CATHETERIZATION WITH CORONARY ANGIOGRAM;  Surgeon: Peter M Swaziland, MD;  Location: [facility];  Service: Cardiovascular;  Laterality: N/A;   TEE WITHOUT CARDIOVERSION N/A 02/15/2014   Procedure: TRANSESOPHAGEAL ECHOCARDIOGRAM (TEE);  Surgeon: Alleen Borne, MD;  Location: [facility] OR;  Service: Open Heart Surgery;  Laterality: N/A;   Social History:   reports that she has never smoked. She has never used smokeless tobacco. She reports that she does not drink alcohol and does not use drugs.  Family History  Problem Relation Age of  Onset   Stroke Mother    Hypertension Mother    Hypertension Sister    Diabetes Brother    Diabetes Sister    Heart attack Neg Hx    Breast cancer Neg Hx     Medications: Patient's Medications  New Prescriptions   No medications on file  Previous Medications   ACETAMINOPHEN (TYLENOL) 500 MG TABLET    Take 1,000 mg by mouth every 6 (six) hours as needed (pain).   ALPRAZOLAM (XANAX) 0.25 MG TABLET    TAKE 1 TABLET BY MOUTH TWICE A DAY AS NEEDED FOR ANXIETY   AMLODIPINE (NORVASC) 10 MG TABLET    Take 10 mg by mouth daily.   BUSPIRONE (BUSPAR) 30 MG TABLET    Take 0.5 tablets (15 mg total) by mouth 4 (four) times daily.    CALCIUM-MAGNESIUM-VITAMIN D 600-40-500 MG-MG-UNIT TB24    Take 1 tablet by mouth daily.   CHOLECALCIFEROL (VITAMIN D3) 50 MCG (2000 UT) TABS    Take 1 capsule by mouth daily.   DIVALPROEX (DEPAKOTE SPRINKLES) 125 MG CAPSULE    To use 1 capsule in the morning and 2 in the evening   EZETIMIBE (ZETIA) 10 MG TABLET    Take 10 mg by mouth daily.   LATANOPROST (XALATAN) 0.005 % OPHTHALMIC SOLUTION    Place 1 drop into the left eye at bedtime.    LEVOTHYROXINE (SYNTHROID) 100 MCG TABLET    TAKE 1 TABLET BY MOUTH EVERY DAY BEFORE BREAKFAST   MIRTAZAPINE (REMERON) 15 MG TABLET    TAKE 1 TABLET BY MOUTH EVERYDAY AT BEDTIME   POLYETHYLENE GLYCOL (MIRALAX / GLYCOLAX) 17 G PACKET    Take 17 g by mouth daily.  Modified Medications   No medications on file  Discontinued Medications   No medications on file    Physical Exam:  Vitals:   Feb 24, 2021 0914  Temp: 99.4 F (37.4 C)  TempSrc: Temporal   There is no height or weight on file to calculate BMI. Wt Readings from Last 3 Encounters:  11/07/20 146 lb 6.4 oz (66.4 kg)  10/17/20 153 lb 3.5 oz (69.5 kg)  09/17/20 153 lb 6.4 oz (69.6 kg)    Physical Exam  Labs reviewed: Basic Metabolic Panel: Recent Labs    05/21/20 1218 06/13/20 1628 10/11/20 0026 10/12/20 0951 10/16/20 0316 10/17/20 0140 11/07/20 1539  NA 140   < > 132*   < > 143 141 141  K 4.1   < > 3.1*   < > 3.0* 3.4* 4.0  CL 106   < > 98   < > 106 106 106  CO2 25   < > 23   < > 26 24 26   GLUCOSE 95   < > 112*   < > 104* 94 90  BUN 12   < > 11   < > 10 11 11   CREATININE 0.88   < > 0.98   < > 0.71 0.74 0.96*  CALCIUM 10.2   < > 9.2   < > 9.0 9.3 10.0  MG  --   --   --   --   --  1.8  --   TSH 2.83  --  5.740*  --   --   --  6.55*   < > = values in this interval not displayed.   Liver Function Tests: Recent Labs    02/27/20 0300 05/21/20 1218 06/13/20 1628 10/10/20 1845 10/13/20 0141  AST 16   < > 13  26 28  ALT 12   < > 8 14 15   ALKPHOS 35*  --   --  41 38  BILITOT 0.9    < > 0.3 0.5 0.4  PROT 6.7   < > 7.9 7.6 7.0  ALBUMIN 3.4*  --   --  3.8 3.4*   < > = values in this interval not displayed.   No results for input(s): LIPASE, AMYLASE in the last 8760 hours. No results for input(s): AMMONIA in the last 8760 hours. CBC: Recent Labs    06/13/20 1628 10/10/20 1845 10/11/20 0026 10/13/20 0233 10/16/20 0316 11/07/20 1539  WBC 6.8 5.1   < > 12.1* 7.9 5.0  NEUTROABS 3,992 4.0  --   --   --  1,995  HGB 12.3 11.8*   < > 12.4 11.4* 11.9  HCT 37.1 36.0   < > 37.3 34.0* 37.1  MCV 86.1 86.3   < > 85.7 84.4 86.9  PLT 271 198   < > 233 247 248   < > = values in this interval not displayed.   Lipid Panel: Recent Labs    02/15/20 1136 05/21/20 1218  CHOL 177 181  HDL 67 75  LDLCALC 96 86  TRIG 49 105  CHOLHDL 2.6 2.4   TSH: Recent Labs    05/21/20 1218 10/11/20 0026 11/07/20 1539  TSH 2.83 5.740* 6.55*   A1C: Lab Results  Component Value Date   HGBA1C 6.1 (H) 02/11/2014     Assessment/Plan 1. Dementia with behavioral disturbance, unspecified dementia type (HCC) Advanced, end stage dementia. Now with decrease LOC, not eating or drinking. Daughter and son request comfort only and want hospice to follow her for any needs that may arise. - Ambulatory referral to Hospice  2. FTT (failure to thrive) in adult - Ambulatory referral to [facility]  [facility] [phone]    Virtual Visit via mychart video  I connected with patient on 02-20-21 at  9:30 AM EDT by video and verified that I am speaking with the correct person using two identifiers.  Location: Patient: home Provider: [facility]   I discussed the limitations, risks, security and privacy concerns of performing an evaluation and management service by telephone and the availability of in person appointments. I also discussed with the patient that there may be a patient responsible charge related to this service. The patient  expressed understanding and agreed to proceed.   I discussed the assessment and treatment plan with the patient. The patient was provided an opportunity to ask questions and all were answered. The patient agreed with the plan and demonstrated an understanding of the instructions.   The patient was advised to call back or seek an in-person evaluation if the symptoms worsen or if the condition fails to improve as anticipated.  I provided 15 minutes of non-face-to-face time during this encounter.  01/29/21. Janene Harvey Avs printed and mailed

## 2021-01-31 DEATH — deceased

## 2021-02-16 ENCOUNTER — Ambulatory Visit: Payer: Medicare PPO | Admitting: Nurse Practitioner

## 2022-01-08 ENCOUNTER — Ambulatory Visit: Payer: Medicare PPO | Admitting: Nurse Practitioner

## 2022-05-04 IMAGING — CT CT HEAD W/O CM
3 series · 15 of 47 positions shown, 18 images · non-contrast
Comparison: None.

CLINICAL DATA: Mental status change with unknown cause. Fall at
home

EXAM:
CT HEAD WITHOUT CONTRAST
CT CERVICAL SPINE WITHOUT CONTRAST
TECHNIQUE: Multidetector CT imaging of the head and cervical spine was
performed following the standard protocol without intravenous
contrast. Multiplanar CT image reconstructions of the cervical spine
were also generated.

[Series 3: head wo · axial · 0.47mm/px · z∈[+1279,+1404]mm · 9 of 31 slices shown, 12 images]
[im 3/31  brain]
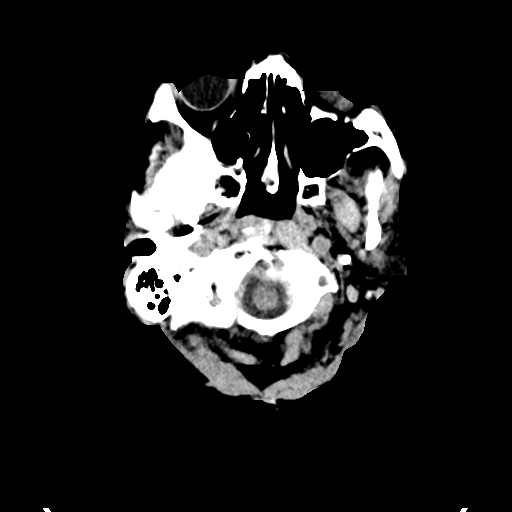
[im 3/31  bone]
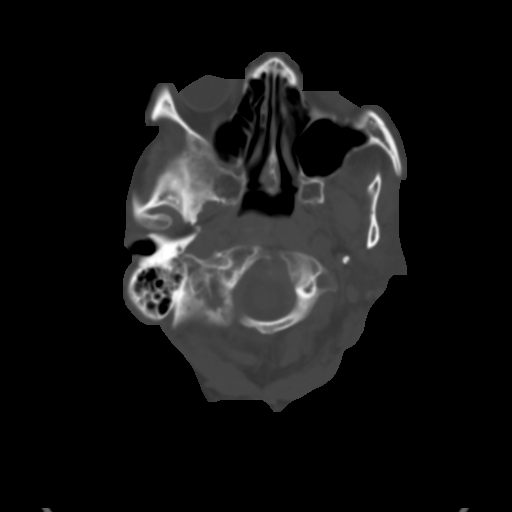
[im 6/31  brain]
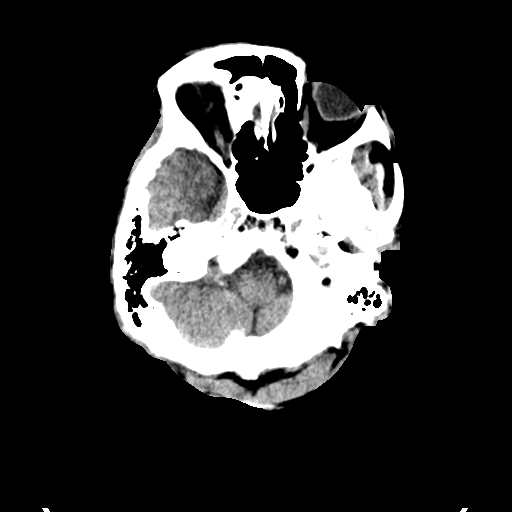
[im 9/31  brain]
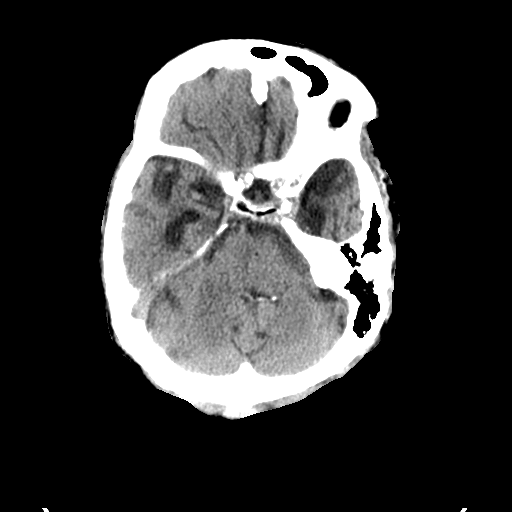
[im 12/31  brain]
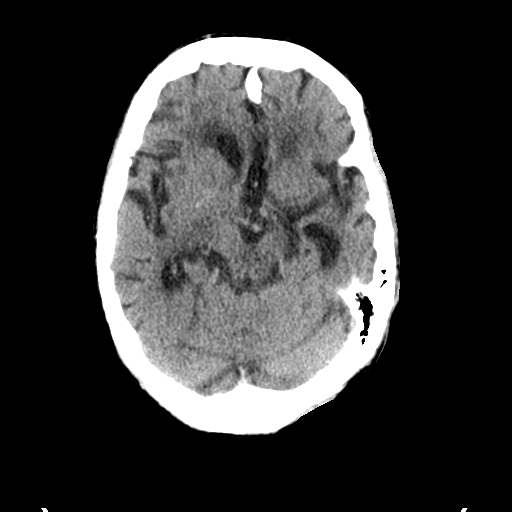
[im 16/31  brain]
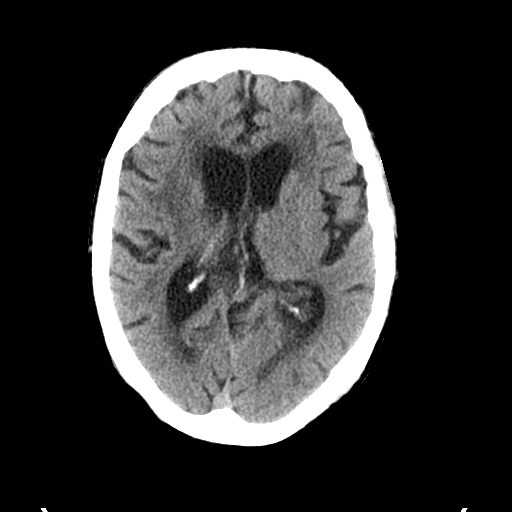
[im 16/31  bone]
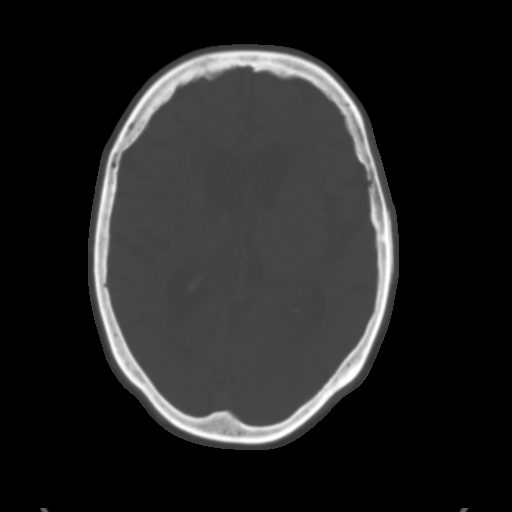
[im 19/31  brain]
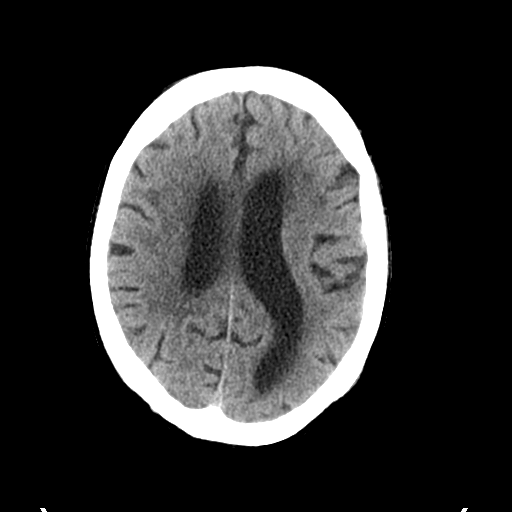
[im 22/31  brain]
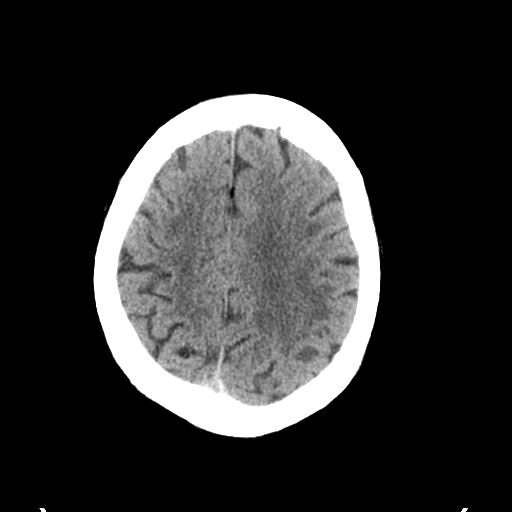
[im 25/31  brain]
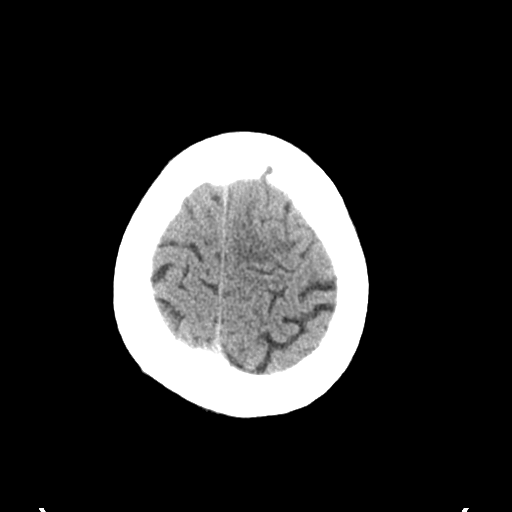
[im 28/31  brain]
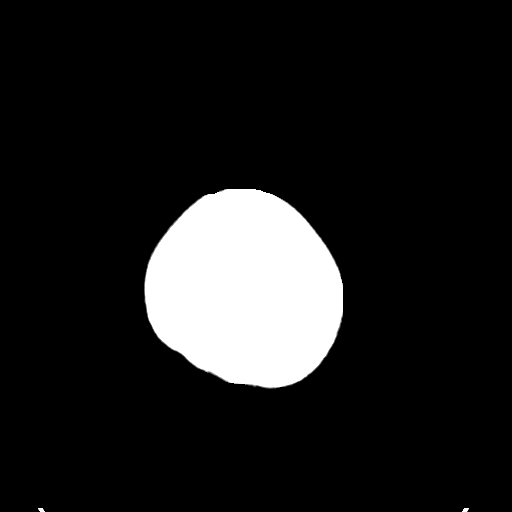
[im 28/31  bone]
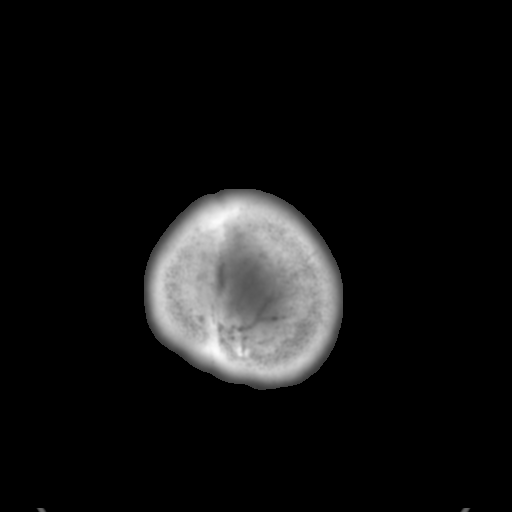

[Series 5: coronal soft tissue · coronal · 0.30mm/px · 3 of 63 slices shown]
[im 21/63  brain]
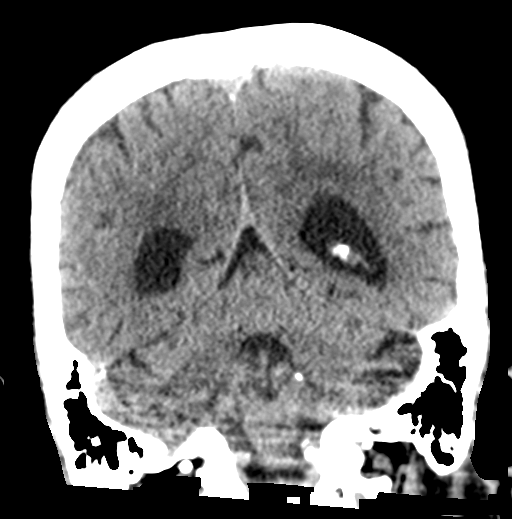
[im 28/63  brain]
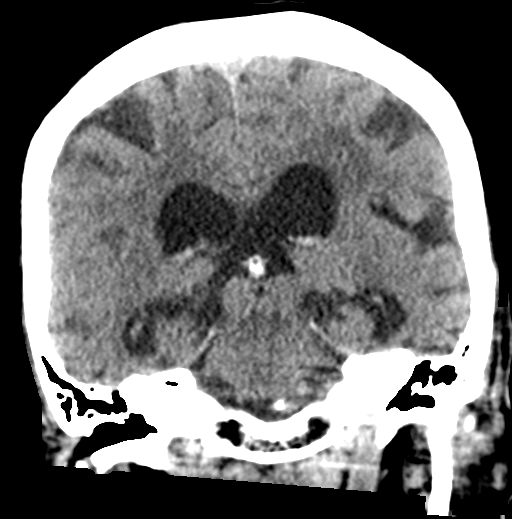
[im 35/63  brain]
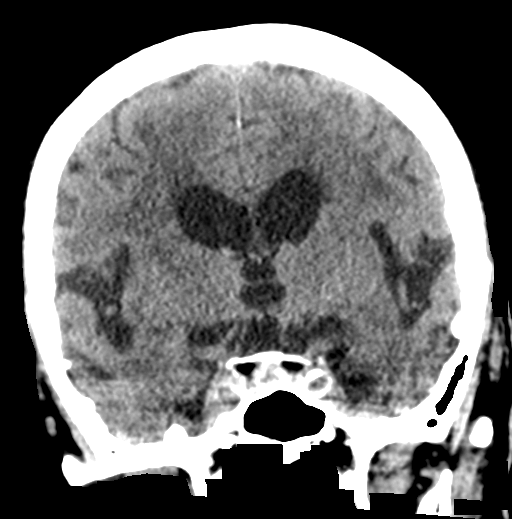

[Series 6: sagittal soft tissue · sagittal · 0.30mm/px · 3 of 51 slices shown]
[im 17/51  brain]
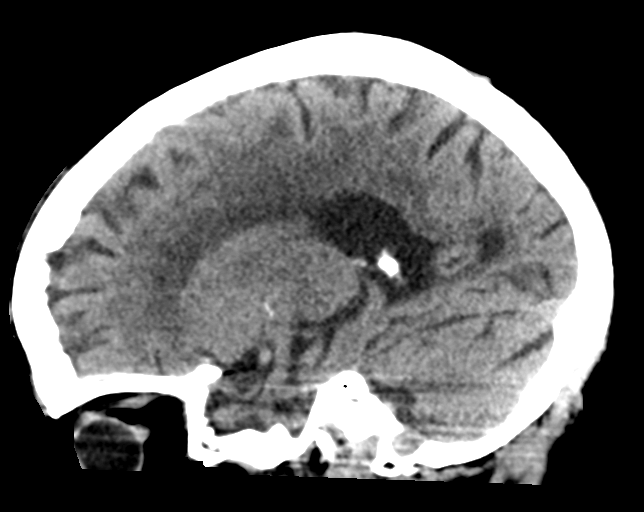
[im 26/51  brain]
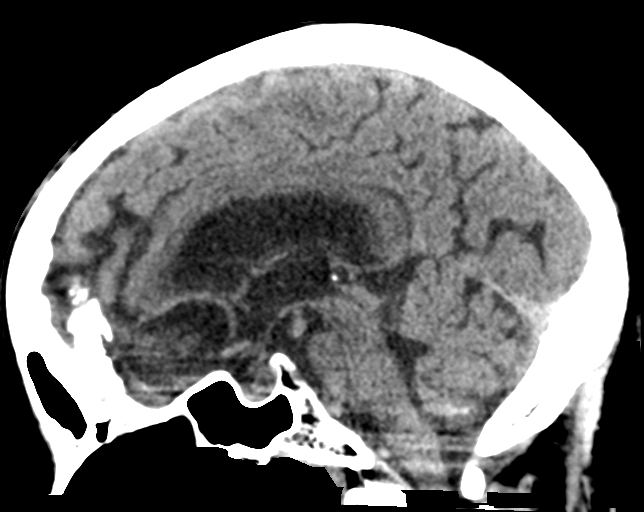
[im 34/51  brain]
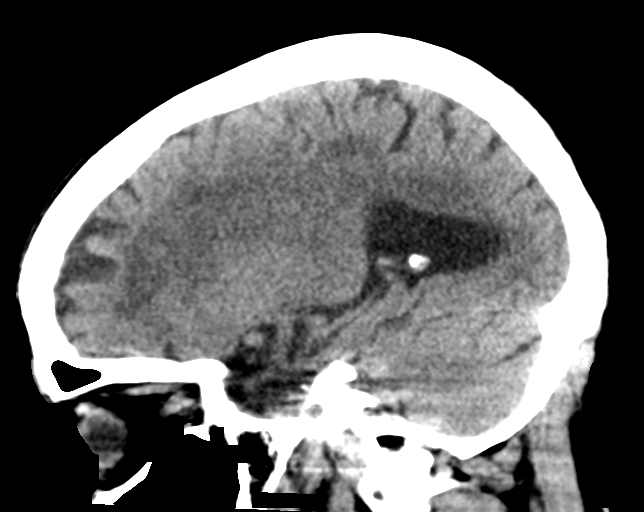

[15 of 47 positions shown; findings below may reference images not displayed]

FINDINGS: CT HEAD FINDINGS

Brain: No evidence of acute infarction, hemorrhage, hydrocephalus,
extra-axial collection or mass lesion/mass effect. Atrophy that is
most notable in the bilateral temporal lobes, correlating with
dementia history. Moderate for age chronic small vessel ischemia in
the white matter.

Vascular: No hyperdense vessel or unexpected calcification.

Skull: Negative for fracture

Sinuses/Orbits: No evidence of injury. Bilateral cataract resection.
Partial bilateral mastoid opacification with negative nasopharynx.

CT CERVICAL SPINE FINDINGS

Alignment: No traumatic malalignment

Skull base and vertebrae: No acute fracture or incidental bone
lesion. Incidental T3-4 non segmentation.

Soft tissues and spinal canal: No prevertebral fluid or swelling. No
visible canal hematoma.

Disc levels: Ordinary degenerative changes. No visible cord
impingement

Upper chest: Negative
IMPRESSION: 1. No evidence of intracranial or cervical spine injury.
2. Temporal predominant brain atrophy in keeping with history of
dementia.

## 2022-12-25 IMAGING — US US RENAL
1 series · 14 of 25 positions shown · non-contrast
Comparison: None.

CLINICAL DATA: Acute kidney injury

EXAM:
RENAL / URINARY TRACT ULTRASOUND COMPLETE

[Series 1: us renal · 14 of 42 slices shown]
[im 1/42]
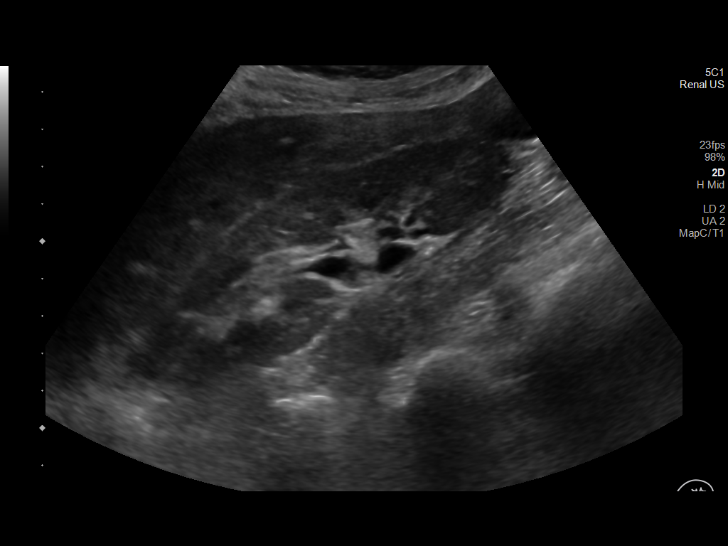
[im 4/42]
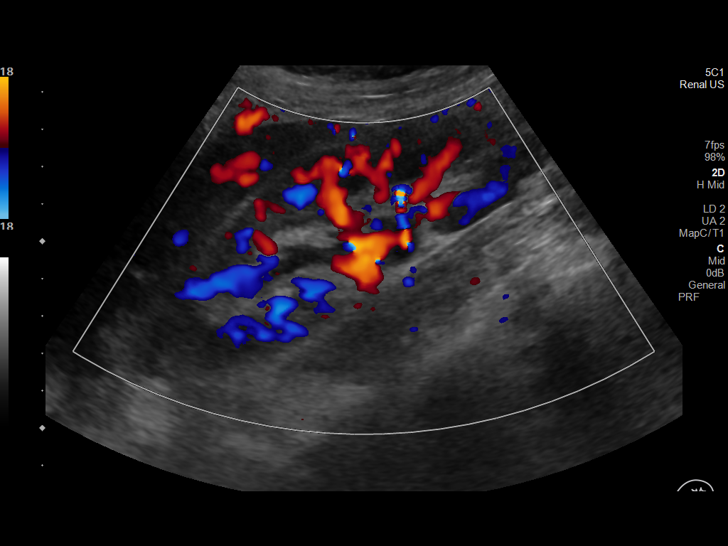
[im 7/42]
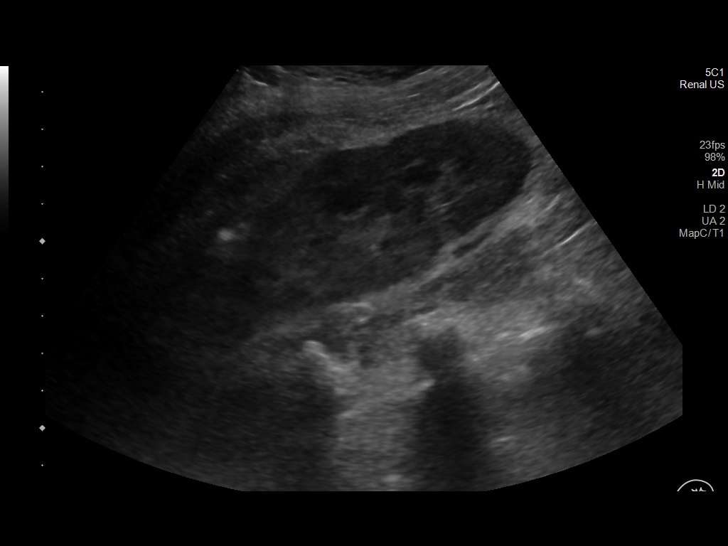
[im 11/42]
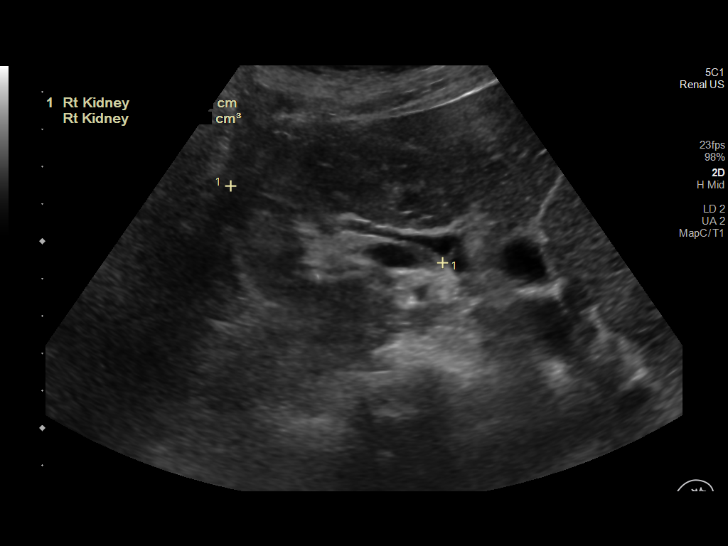
[im 14/42]
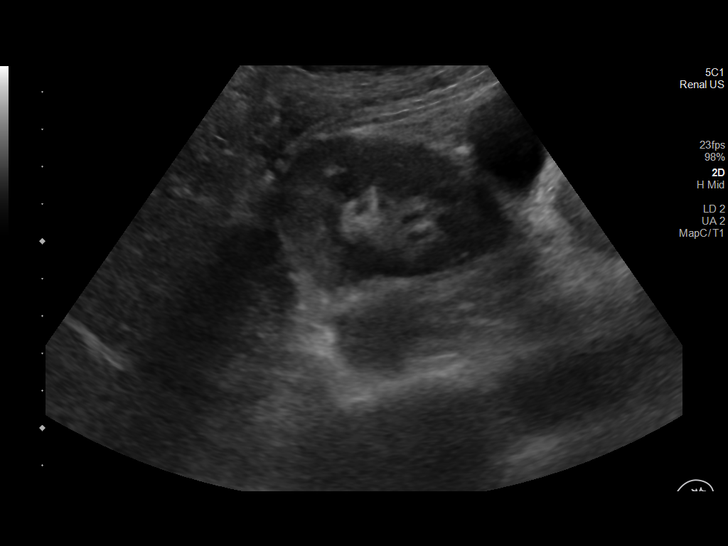
[im 16/42]
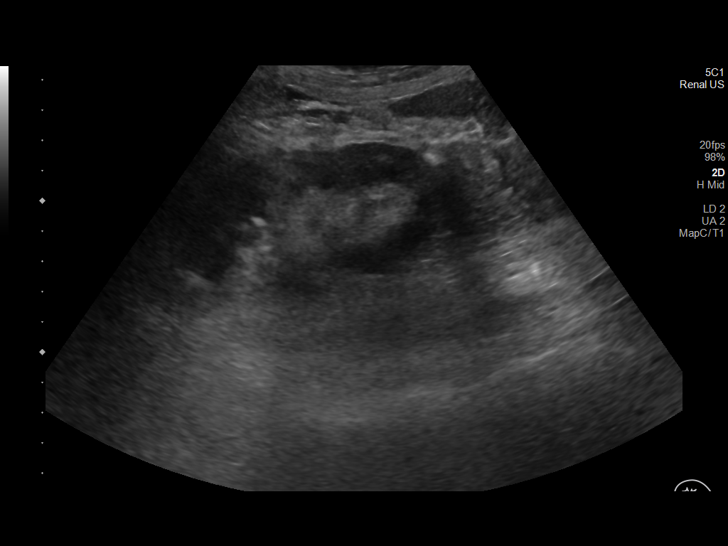
[im 19/42]
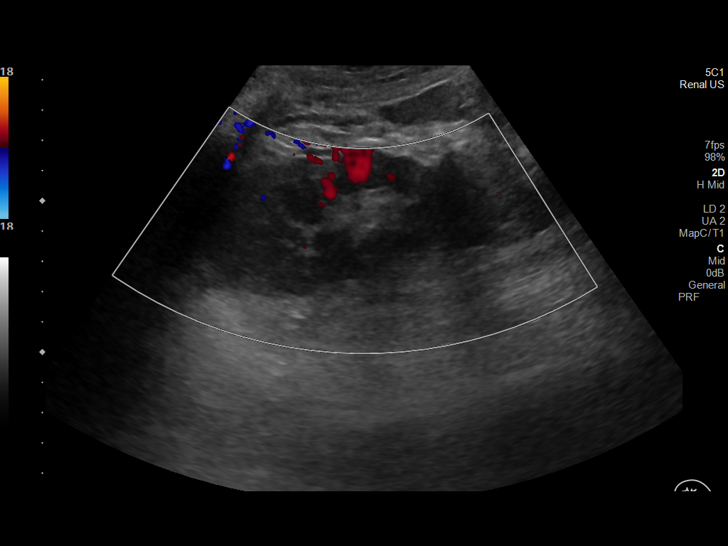
[im 23/42]
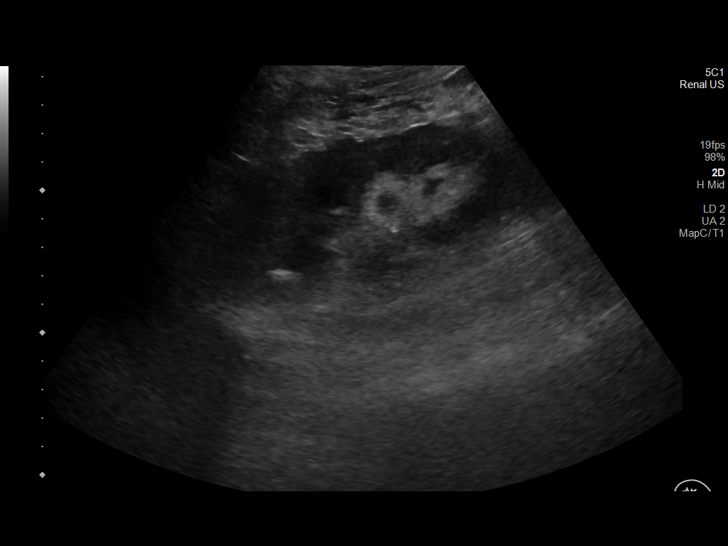
[im 26/42]
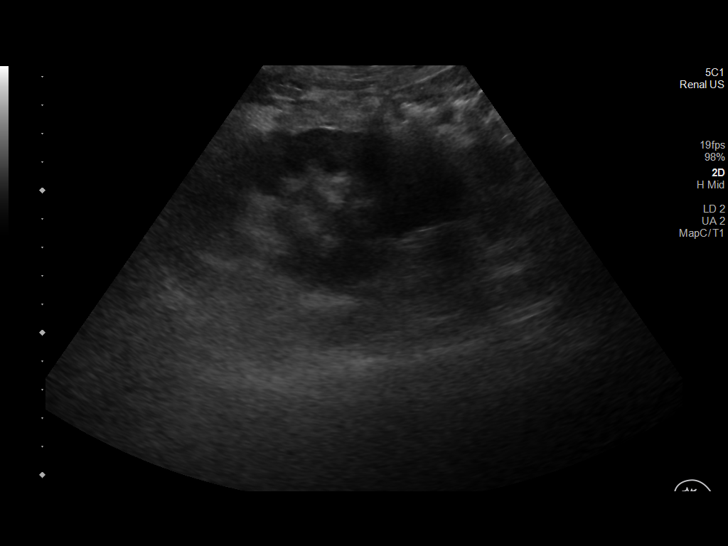
[im 28/42]
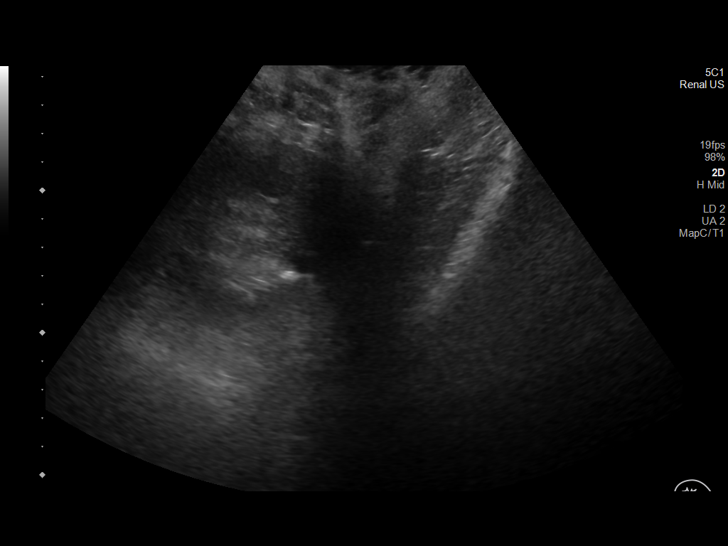
[im 31/42]
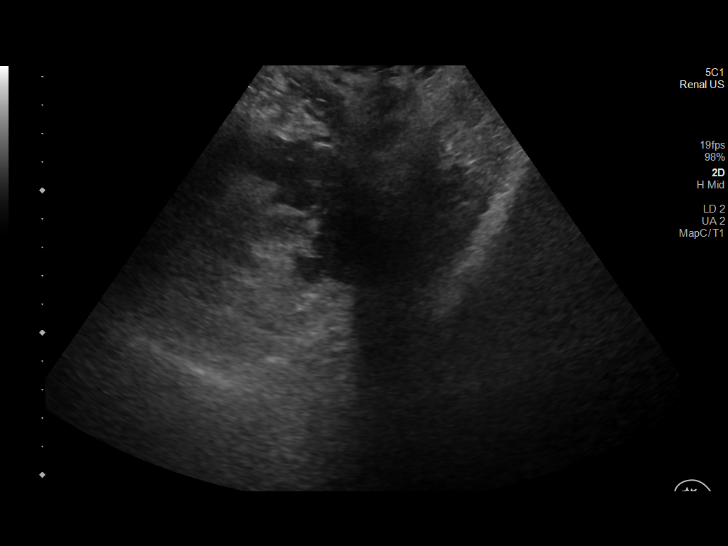
[im 35/42]
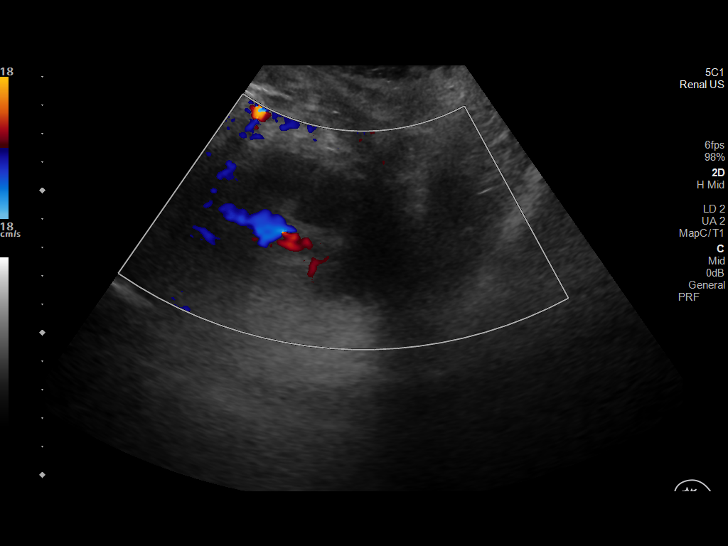
[im 38/42]
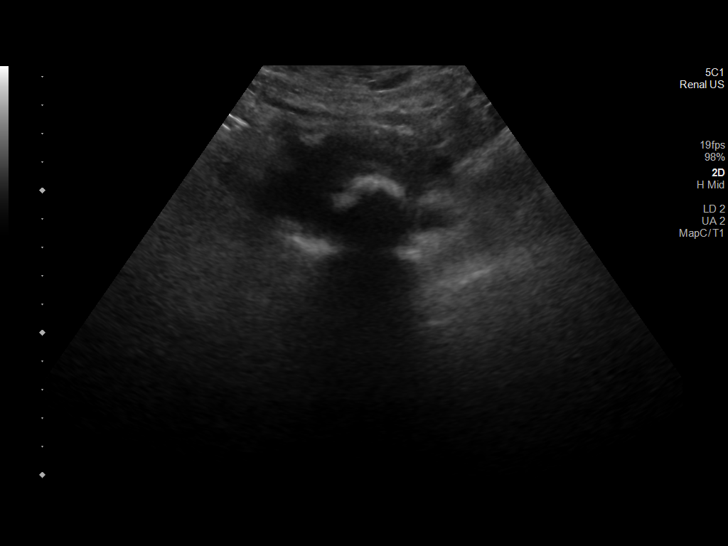
[im 42/42]
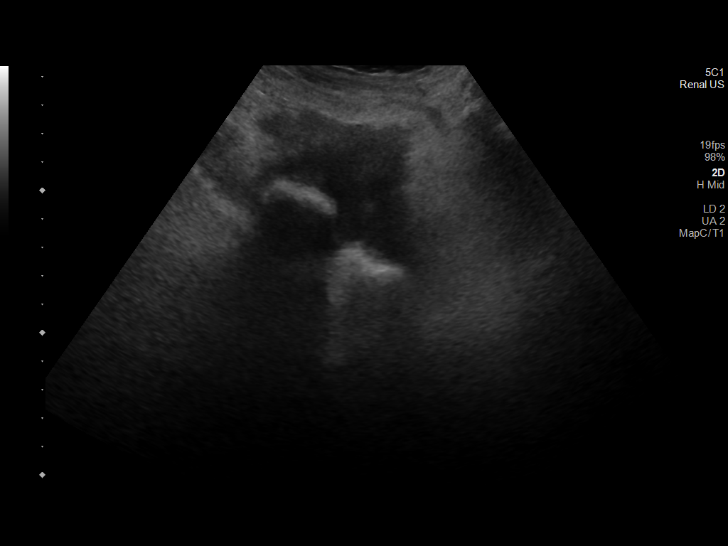

[14 of 25 positions shown; findings below may reference images not displayed]

FINDINGS: Technical note: Examination technically limited by patient condition
and inability to breath hold.

Right Kidney:

Renal measurements: 11.4 x 4.4 x 6.0 cm = volume: 157 mL.
Echogenicity within normal limits. Mild hydronephrosis. No mass or
shadowing stone visualized.

Left Kidney:

Renal measurements: 10.1 x 4.8 x 5.2 cm = volume: 133 mL.
Echogenicity within normal limits. 13 mm echogenic, shadowing
calcification the upper pole of the left kidney. No mass or
hydronephrosis visualized.

Bladder:

Decompressed by Foley catheter.

Other:

None.
IMPRESSION: 1. Limited exam.
2. Mild right hydronephrosis.
3. Probable nonobstructing 1.3 cm left upper calculus.
